# Patient Record
Sex: Male | Born: 1970 | Race: Black or African American | Hispanic: No | Marital: Single | Smoking: Never smoker
Health system: Southern US, Community
[De-identification: ages and names within clinical notes are randomized; demographics above are authoritative.]

## PROBLEM LIST (undated history)

## (undated) DIAGNOSIS — G8220 Paraplegia, unspecified: Secondary | ICD-10-CM

## (undated) DIAGNOSIS — E079 Disorder of thyroid, unspecified: Secondary | ICD-10-CM

## (undated) HISTORY — DX: Disorder of thyroid, unspecified: E07.9

## (undated) HISTORY — PX: SKIN GRAFT: SHX250

---

## 1998-11-13 ENCOUNTER — Encounter: Admission: RE | Admit: 1998-11-13 | Discharge: 1999-02-11 | Payer: Self-pay | Admitting: *Deleted

## 2000-05-22 ENCOUNTER — Encounter: Admission: RE | Admit: 2000-05-22 | Discharge: 2000-05-25 | Payer: Self-pay | Admitting: Internal Medicine

## 2008-06-13 ENCOUNTER — Inpatient Hospital Stay (HOSPITAL_COMMUNITY): Admission: EM | Admit: 2008-06-13 | Discharge: 2008-06-18 | Payer: Self-pay | Admitting: Emergency Medicine

## 2008-07-14 ENCOUNTER — Inpatient Hospital Stay (HOSPITAL_COMMUNITY): Admission: EM | Admit: 2008-07-14 | Discharge: 2008-07-18 | Payer: Self-pay | Admitting: Emergency Medicine

## 2008-07-16 ENCOUNTER — Ambulatory Visit: Payer: Self-pay | Admitting: Infectious Diseases

## 2008-07-18 ENCOUNTER — Encounter (HOSPITAL_BASED_OUTPATIENT_CLINIC_OR_DEPARTMENT_OTHER): Admission: RE | Admit: 2008-07-18 | Discharge: 2008-08-04 | Payer: Self-pay | Admitting: Internal Medicine

## 2008-07-29 ENCOUNTER — Emergency Department (HOSPITAL_COMMUNITY): Admission: EM | Admit: 2008-07-29 | Discharge: 2008-07-30 | Payer: Self-pay | Admitting: Emergency Medicine

## 2008-12-12 ENCOUNTER — Encounter (HOSPITAL_COMMUNITY): Admission: RE | Admit: 2008-12-12 | Discharge: 2009-02-05 | Payer: Self-pay | Admitting: Internal Medicine

## 2009-04-19 ENCOUNTER — Encounter (HOSPITAL_COMMUNITY): Admission: RE | Admit: 2009-04-19 | Discharge: 2009-07-18 | Payer: Self-pay | Admitting: Endocrinology

## 2009-04-23 ENCOUNTER — Ambulatory Visit (HOSPITAL_COMMUNITY): Admission: RE | Admit: 2009-04-23 | Discharge: 2009-04-23 | Payer: Self-pay | Admitting: Endocrinology

## 2009-05-11 ENCOUNTER — Inpatient Hospital Stay (HOSPITAL_COMMUNITY): Admission: EM | Admit: 2009-05-11 | Discharge: 2009-05-21 | Payer: Self-pay | Admitting: Emergency Medicine

## 2009-05-15 ENCOUNTER — Encounter (INDEPENDENT_AMBULATORY_CARE_PROVIDER_SITE_OTHER): Payer: Self-pay | Admitting: Internal Medicine

## 2009-05-17 ENCOUNTER — Ambulatory Visit: Payer: Self-pay | Admitting: Infectious Disease

## 2009-07-25 ENCOUNTER — Ambulatory Visit (HOSPITAL_COMMUNITY): Admission: RE | Admit: 2009-07-25 | Discharge: 2009-07-25 | Payer: Self-pay | Admitting: Internal Medicine

## 2009-10-24 IMAGING — CR DG CHEST 1V PORT
1 series · 1 of 1 positions shown · non-contrast
Comparison: None

CLINICAL DATA: Chest pain.

PORTABLE CHEST - 1 VIEW

[view not recorded]
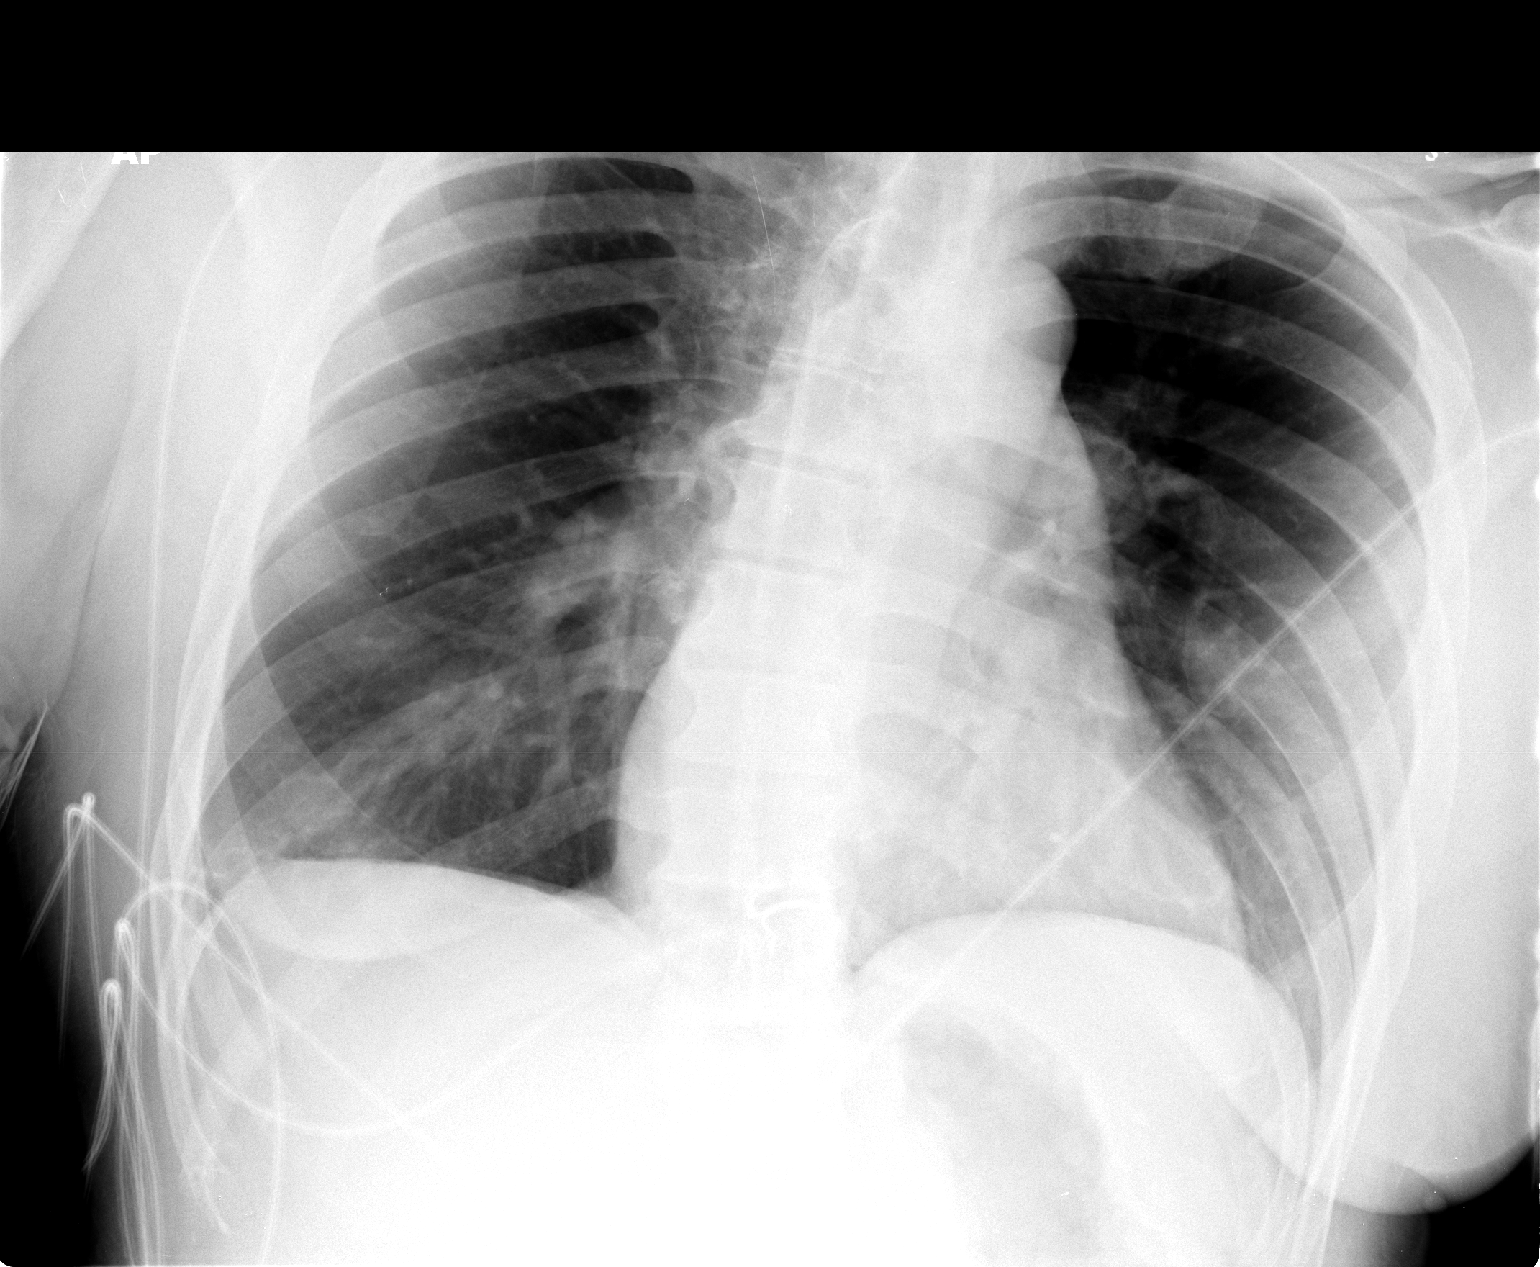

[1 of 1 positions shown; findings below may reference images not displayed]

FINDINGS: The cardiac silhouette, mediastinal and hilar contours
are within normal limits.  The lungs are clear except for streaky
basilar atelectasis.  The bony thorax is intact.
IMPRESSION: No acute cardiopulmonary findings.  Minimal streaky bibasilar
atelectasis.

## 2009-10-25 IMAGING — CR DG HIP W/ PELVIS BILAT
5 series · 5 of 5 positions shown · non-contrast
Comparison: 06/13/2008 CT pelvis

CLINICAL DATA: Ulcer, question osteomyelitis

BILATERAL HIP WITH PELVIS - 4+ VIEW

[t pelvis a.p.]
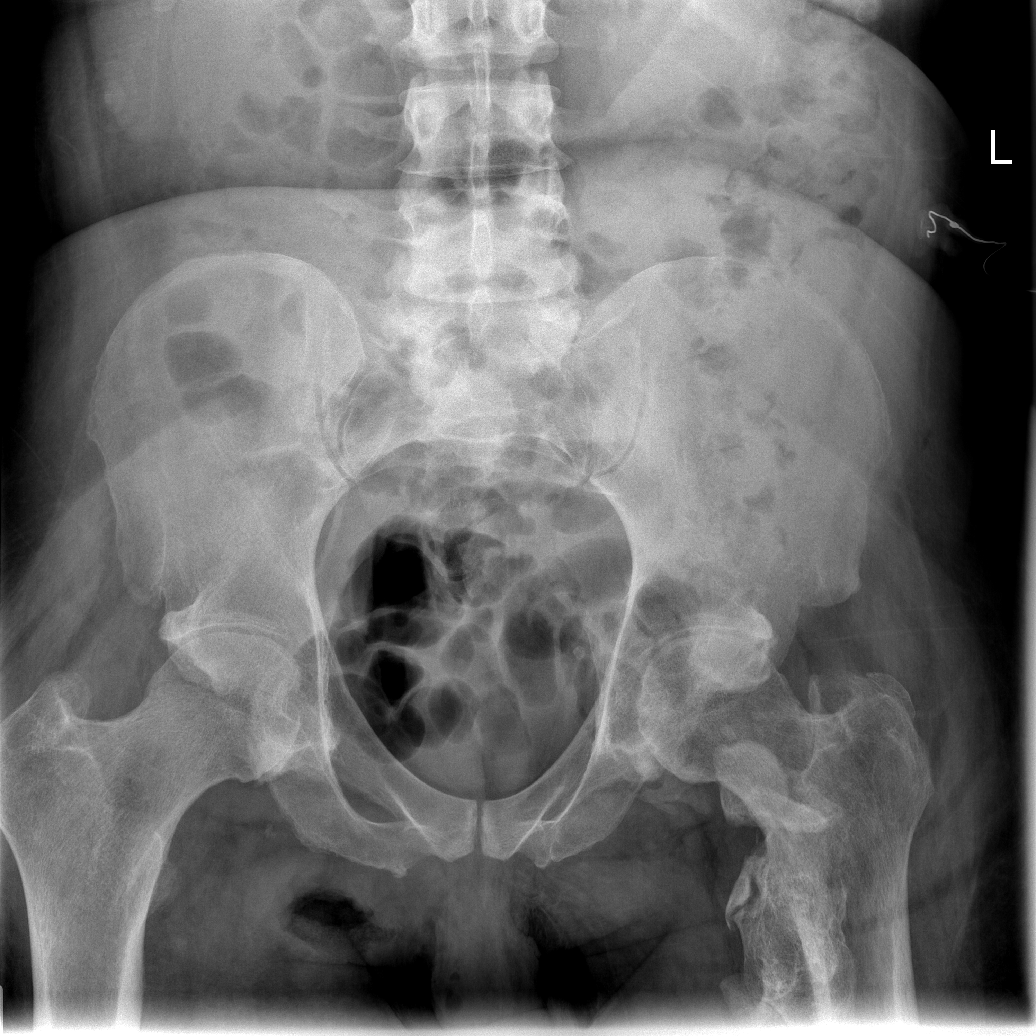

[t hip ap left]
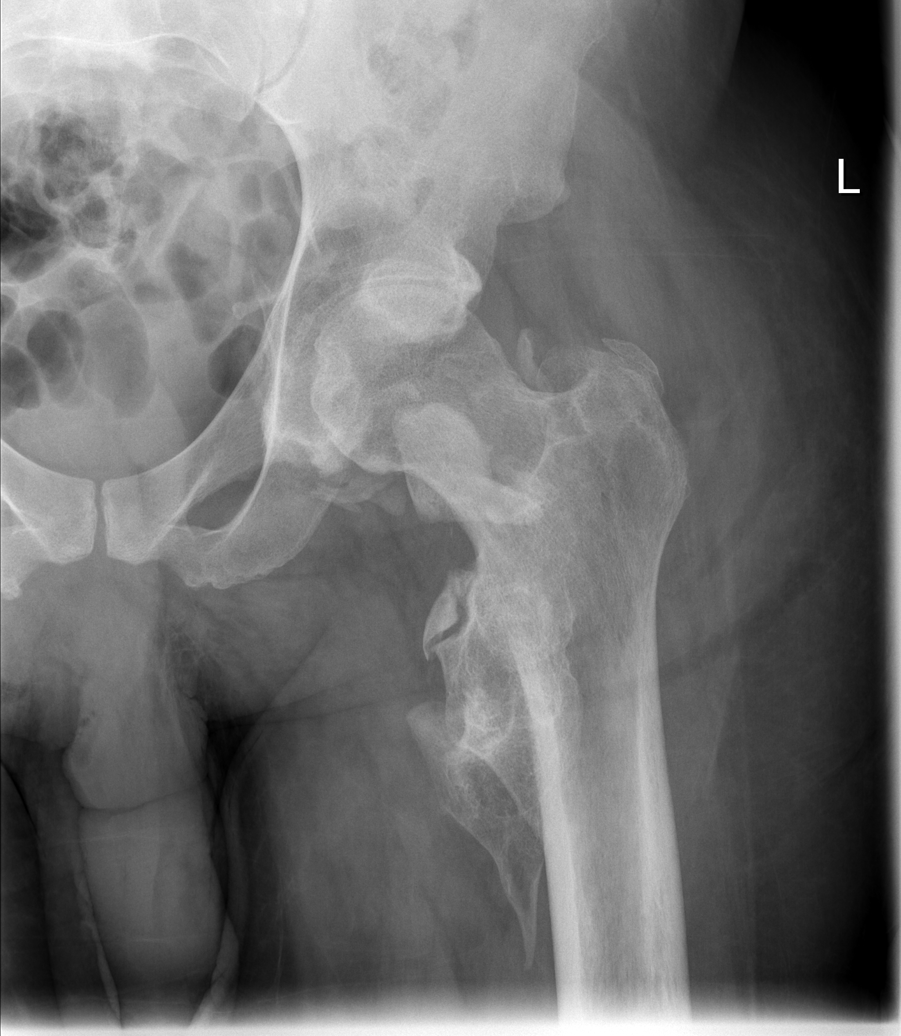

[t hip ap right]
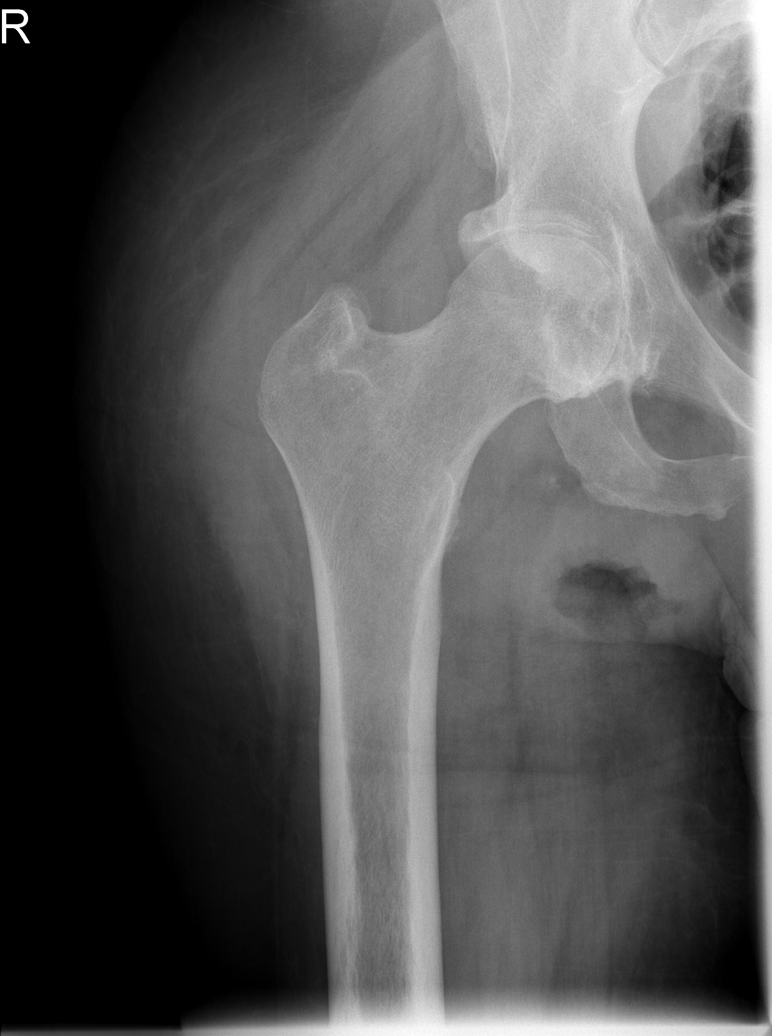

[t hip frog leg right]
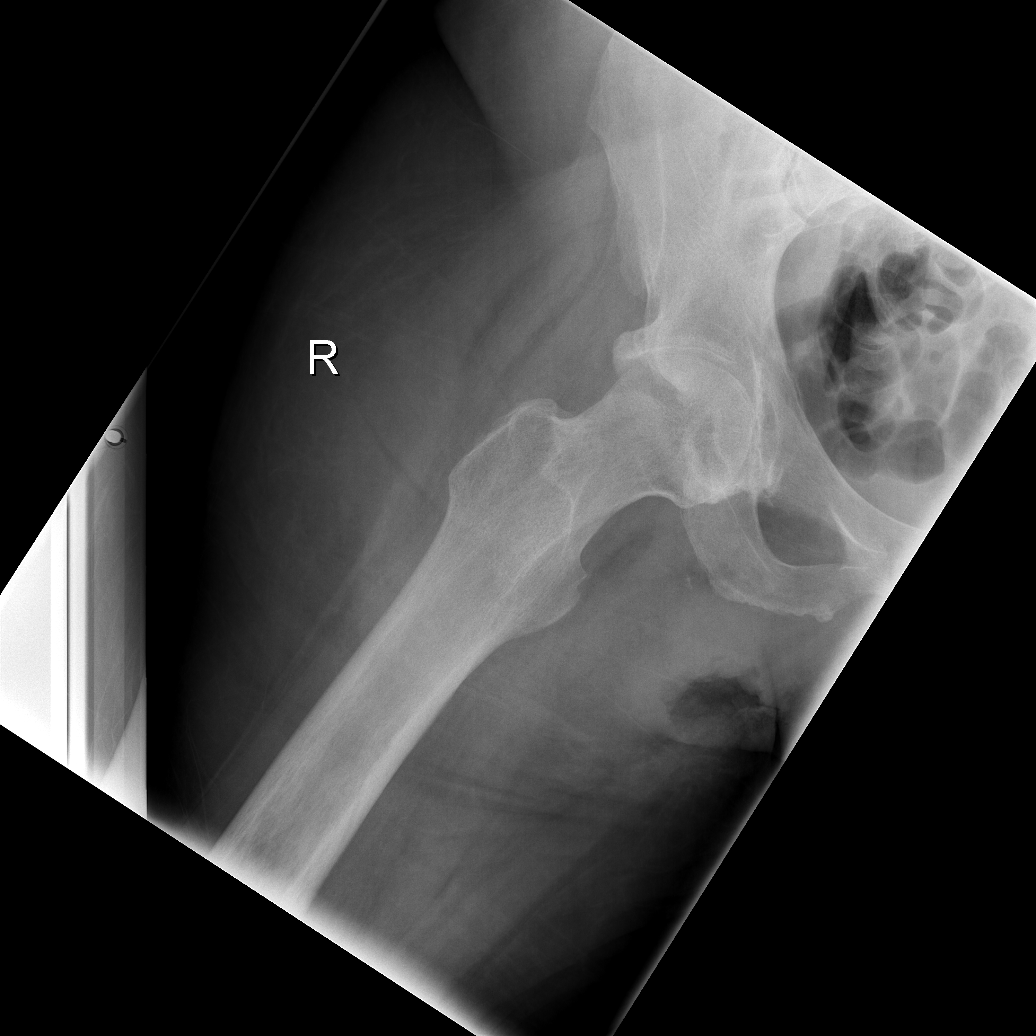

[t hip frog leg left]
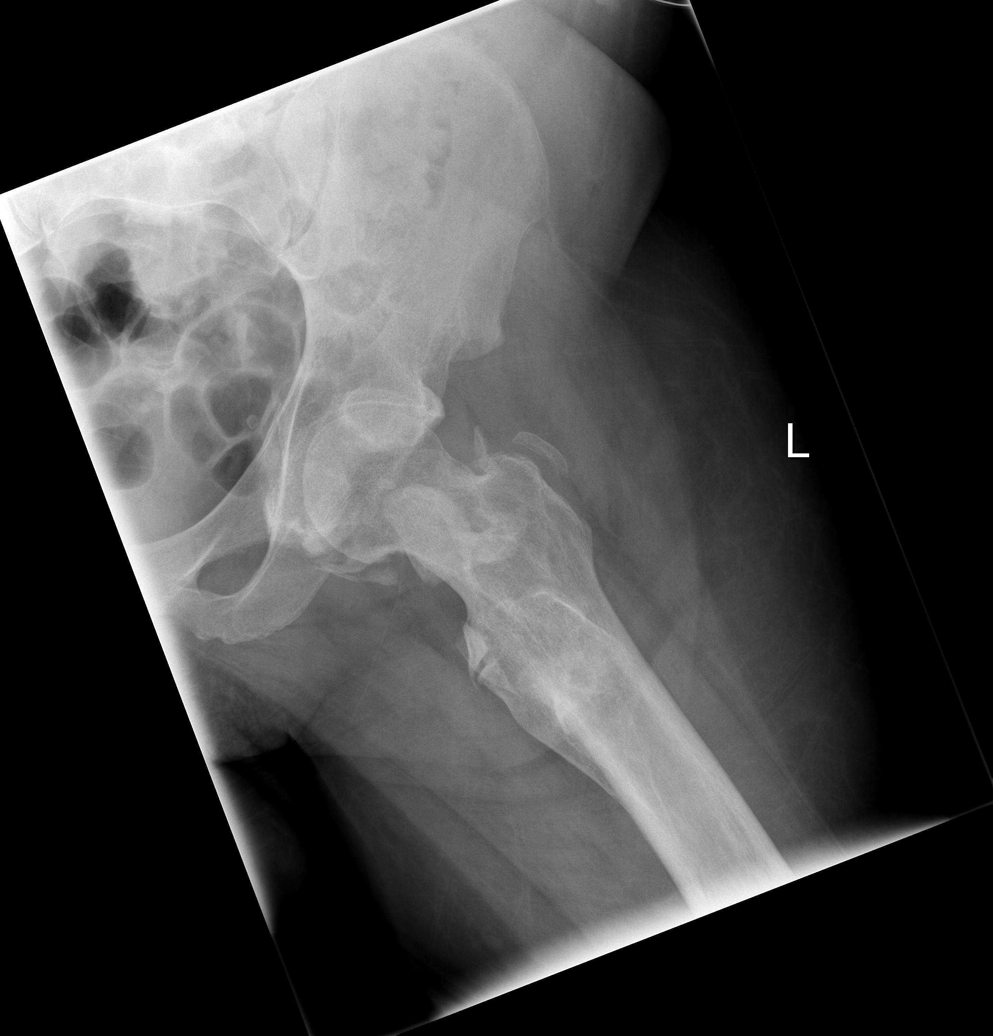

[5 of 5 positions shown; findings below may reference images not displayed]

FINDINGS: Heterotopic bone at left hip and proximal femur.
Mild bilateral hip joint space narrowing.
Bony pelvis intact.
Focus of soft tissue gas in right perineum, corresponding to
decubitus ulcer and soft tissue infection on prior CT.
No definite bone destruction identified to suggest osteomyelitis.
Left pelvic phlebolith.
Visualized bowel gas pattern normal.
IMPRESSION: Soft tissue gas in right perineum compatible with known
ulcer/abscess.
No definite bone destruction to suggest osteomyelitis by plain
film.
Bony demineralization with bilateral hip joint space narrowing.

## 2009-11-04 ENCOUNTER — Emergency Department (HOSPITAL_COMMUNITY): Admission: EM | Admit: 2009-11-04 | Discharge: 2009-11-04 | Payer: Self-pay | Admitting: Emergency Medicine

## 2010-05-16 LAB — URINALYSIS, ROUTINE W REFLEX MICROSCOPIC
Nitrite: NEGATIVE
Specific Gravity, Urine: 1.016 (ref 1.005–1.030)
pH: 7 (ref 5.0–8.0)

## 2010-05-27 LAB — C-REACTIVE PROTEIN: CRP: 7 mg/dL — ABNORMAL HIGH (ref <0.6)

## 2010-05-27 LAB — CBC
HCT: 19.5 % — ABNORMAL LOW (ref 39.0–52.0)
HCT: 25.5 % — ABNORMAL LOW (ref 39.0–52.0)
HCT: 27.1 % — ABNORMAL LOW (ref 39.0–52.0)
HCT: 27.6 % — ABNORMAL LOW (ref 39.0–52.0)
Hemoglobin: 6 g/dL — CL (ref 13.0–17.0)
Hemoglobin: 6.7 g/dL — CL (ref 13.0–17.0)
Hemoglobin: 7.1 g/dL — ABNORMAL LOW (ref 13.0–17.0)
Hemoglobin: 7.2 g/dL — ABNORMAL LOW (ref 13.0–17.0)
Hemoglobin: 7.9 g/dL — ABNORMAL LOW (ref 13.0–17.0)
Hemoglobin: 8.5 g/dL — ABNORMAL LOW (ref 13.0–17.0)
MCHC: 29.8 g/dL — ABNORMAL LOW (ref 30.0–36.0)
MCHC: 30.9 g/dL (ref 30.0–36.0)
MCHC: 31.2 g/dL (ref 30.0–36.0)
MCV: 73.4 fL — ABNORMAL LOW (ref 78.0–100.0)
MCV: 76.4 fL — ABNORMAL LOW (ref 78.0–100.0)
MCV: 77.6 fL — ABNORMAL LOW (ref 78.0–100.0)
MCV: 79 fL (ref 78.0–100.0)
Platelets: 466 10*3/uL — ABNORMAL HIGH (ref 150–400)
Platelets: 529 10*3/uL — ABNORMAL HIGH (ref 150–400)
Platelets: 542 10*3/uL — ABNORMAL HIGH (ref 150–400)
Platelets: 743 10*3/uL — ABNORMAL HIGH (ref 150–400)
RBC: 2.94 MIL/uL — ABNORMAL LOW (ref 4.22–5.81)
RBC: 3.08 MIL/uL — ABNORMAL LOW (ref 4.22–5.81)
RBC: 3.5 MIL/uL — ABNORMAL LOW (ref 4.22–5.81)
RDW: 22.4 % — ABNORMAL HIGH (ref 11.5–15.5)
RDW: 23 % — ABNORMAL HIGH (ref 11.5–15.5)
RDW: 23.5 % — ABNORMAL HIGH (ref 11.5–15.5)
RDW: 23.8 % — ABNORMAL HIGH (ref 11.5–15.5)
WBC: 13 10*3/uL — ABNORMAL HIGH (ref 4.0–10.5)
WBC: 17.8 10*3/uL — ABNORMAL HIGH (ref 4.0–10.5)
WBC: 37.3 10*3/uL — ABNORMAL HIGH (ref 4.0–10.5)
WBC: 7.5 10*3/uL (ref 4.0–10.5)
WBC: 9.2 10*3/uL (ref 4.0–10.5)
WBC: 9.6 10*3/uL (ref 4.0–10.5)

## 2010-05-27 LAB — URINE CULTURE

## 2010-05-27 LAB — CROSSMATCH
ABO/RH(D): O POS
Antibody Screen: NEGATIVE

## 2010-05-27 LAB — RENAL FUNCTION PANEL
Albumin: 1.3 g/dL — ABNORMAL LOW (ref 3.5–5.2)
BUN: 11 mg/dL (ref 6–23)
Chloride: 113 mEq/L — ABNORMAL HIGH (ref 96–112)
GFR calc non Af Amer: >60 mL/min (ref >60)
Phosphorus: 2.7 mg/dL (ref 2.3–4.6)
Potassium: 3.5 mEq/L (ref 3.5–5.1)
Sodium: 141 mEq/L (ref 135–145)

## 2010-05-27 LAB — COMPREHENSIVE METABOLIC PANEL
ALT: 16 U/L (ref 0–53)
AST: 17 U/L (ref 0–37)
Albumin: 1.7 g/dL — ABNORMAL LOW (ref 3.5–5.2)
Alkaline Phosphatase: 176 U/L — ABNORMAL HIGH (ref 39–117)
BUN: 19 mg/dL (ref 6–23)
Calcium: 7.4 mg/dL — ABNORMAL LOW (ref 8.4–10.5)
Chloride: 107 mEq/L (ref 96–112)
GFR calc Af Amer: 26 mL/min — ABNORMAL LOW (ref >60)
Total Bilirubin: 0.5 mg/dL (ref 0.3–1.2)
Total Protein: 7.4 g/dL (ref 6.0–8.3)

## 2010-05-27 LAB — DIFFERENTIAL
Basophils Absolute: 0 10*3/uL (ref 0.0–0.1)
Basophils Relative: 0 % (ref 0–1)
Basophils Relative: 0 % (ref 0–1)
Eosinophils Absolute: 0.2 10*3/uL (ref 0.0–0.7)
Eosinophils Relative: 0 % (ref 0–5)
Eosinophils Relative: 0 % (ref 0–5)
Eosinophils Relative: 1 % (ref 0–5)
Eosinophils Relative: 3 % (ref 0–5)
Lymphocytes Relative: 10 % — ABNORMAL LOW (ref 12–46)
Lymphs Abs: 0.7 10*3/uL (ref 0.7–4.0)
Lymphs Abs: 1.2 10*3/uL (ref 0.7–4.0)
Lymphs Abs: 1.4 10*3/uL (ref 0.7–4.0)
Lymphs Abs: 1.6 10*3/uL (ref 0.7–4.0)
Monocytes Absolute: 0.9 10*3/uL (ref 0.1–1.0)
Monocytes Absolute: 1.5 10*3/uL — ABNORMAL HIGH (ref 0.1–1.0)
Monocytes Relative: 4 % (ref 3–12)
Monocytes Relative: 7 % (ref 3–12)
Monocytes Relative: 9 % (ref 3–12)
Neutro Abs: 10.8 10*3/uL — ABNORMAL HIGH (ref 1.7–7.7)
Neutro Abs: 35.1 10*3/uL — ABNORMAL HIGH (ref 1.7–7.7)
Neutrophils Relative %: 75 % (ref 43–77)
Neutrophils Relative %: 84 % — ABNORMAL HIGH (ref 43–77)

## 2010-05-27 LAB — CULTURE, ROUTINE-ABSCESS
Culture: NO GROWTH
Gram Stain: NONE SEEN

## 2010-05-27 LAB — ANAEROBIC CULTURE: Gram Stain: NONE SEEN

## 2010-05-27 LAB — BASIC METABOLIC PANEL
BUN: 3 mg/dL — ABNORMAL LOW (ref 6–23)
CO2: 26 mEq/L (ref 19–32)
CO2: 29 mEq/L (ref 19–32)
Calcium: 7.3 mg/dL — ABNORMAL LOW (ref 8.4–10.5)
Calcium: 8 mg/dL — ABNORMAL LOW (ref 8.4–10.5)
Chloride: 105 mEq/L (ref 96–112)
Creatinine, Ser: 0.53 mg/dL (ref 0.4–1.5)
Creatinine, Ser: 0.66 mg/dL (ref 0.4–1.5)
Creatinine, Ser: 0.91 mg/dL (ref 0.4–1.5)
GFR calc Af Amer: >60 mL/min (ref >60)
GFR calc non Af Amer: >60 mL/min (ref >60)
Glucose, Bld: 101 mg/dL — ABNORMAL HIGH (ref 70–99)
Glucose, Bld: 88 mg/dL (ref 70–99)
Potassium: 3.4 mEq/L — ABNORMAL LOW (ref 3.5–5.1)
Sodium: 137 mEq/L (ref 135–145)

## 2010-05-27 LAB — URINALYSIS, ROUTINE W REFLEX MICROSCOPIC
Glucose, UA: NEGATIVE mg/dL
Ketones, ur: NEGATIVE mg/dL
Nitrite: NEGATIVE
Protein, ur: 30 mg/dL — AB
Specific Gravity, Urine: 1.011 (ref 1.005–1.030)
pH: 5.5 (ref 5.0–8.0)

## 2010-05-27 LAB — DIC (DISSEMINATED INTRAVASCULAR COAGULATION)PANEL
D-Dimer, Quant: 2.87 ug/mL-FEU — ABNORMAL HIGH (ref 0.00–0.48)
Fibrinogen: 779 mg/dL — ABNORMAL HIGH (ref 204–475)
Prothrombin Time: 15.1 seconds (ref 11.6–15.2)
aPTT: 50 seconds — ABNORMAL HIGH (ref 24–37)

## 2010-05-27 LAB — IRON AND TIBC
Iron: <10 ug/dL — ABNORMAL LOW (ref 42–135)
UIBC: 118 ug/dL

## 2010-05-27 LAB — CULTURE, BLOOD (ROUTINE X 2): Culture: NO GROWTH

## 2010-05-27 LAB — MRSA PCR SCREENING: MRSA by PCR: POSITIVE — AB

## 2010-05-27 LAB — HEMOGLOBIN AND HEMATOCRIT, BLOOD
HCT: 22.7 % — ABNORMAL LOW (ref 39.0–52.0)
Hemoglobin: 6.9 g/dL — CL (ref 13.0–17.0)

## 2010-05-27 LAB — HIV ANTIBODY (ROUTINE TESTING W REFLEX): HIV: NONREACTIVE

## 2010-05-27 LAB — AFB CULTURE WITH SMEAR (NOT AT ARMC): Acid Fast Smear: NONE SEEN

## 2010-05-27 LAB — VITAMIN B12: Vitamin B-12: 628 pg/mL (ref 211–911)

## 2010-05-27 LAB — SEDIMENTATION RATE
Sed Rate: 138 mm/hr — ABNORMAL HIGH (ref 0–16)
Sed Rate: >140 mm/hr — ABNORMAL HIGH (ref 0–16)

## 2010-05-27 LAB — URINE MICROSCOPIC-ADD ON

## 2010-05-27 LAB — FUNGUS CULTURE W SMEAR

## 2010-05-27 LAB — RETICULOCYTES
Retic Count, Absolute: 56.6 10*3/uL (ref 19.0–186.0)
Retic Ct Pct: 2 % (ref 0.4–3.1)

## 2010-05-27 LAB — PREPARE RBC (CROSSMATCH)

## 2010-05-31 ENCOUNTER — Emergency Department (HOSPITAL_COMMUNITY)
Admission: EM | Admit: 2010-05-31 | Discharge: 2010-05-31 | Disposition: A | Discharge status: Home / Self Care | Payer: Medicare Other | Attending: Emergency Medicine | Admitting: Emergency Medicine

## 2010-05-31 DIAGNOSIS — Z711 Person with feared health complaint in whom no diagnosis is made: Secondary | ICD-10-CM | POA: Insufficient documentation

## 2010-05-31 LAB — BASIC METABOLIC PANEL
CO2: 27 mEq/L (ref 19–32)
Calcium: 9.1 mg/dL (ref 8.4–10.5)
Chloride: 105 mEq/L (ref 96–112)
Glucose, Bld: 110 mg/dL — ABNORMAL HIGH (ref 70–99)
Sodium: 136 mEq/L (ref 135–145)

## 2010-06-06 LAB — CROSSMATCH

## 2010-06-11 LAB — DIFFERENTIAL
Basophils Relative: 0 % (ref 0–1)
Eosinophils Absolute: 0 10*3/uL (ref 0.0–0.7)
Eosinophils Absolute: 0 10*3/uL (ref 0.0–0.7)
Eosinophils Relative: 0 % (ref 0–5)
Lymphocytes Relative: 15 % (ref 12–46)
Lymphs Abs: 1.9 10*3/uL (ref 0.7–4.0)
Lymphs Abs: 1.8 10*3/uL (ref 0.7–4.0)
Monocytes Absolute: 1.7 10*3/uL — ABNORMAL HIGH (ref 0.1–1.0)
Monocytes Absolute: 1.3 10*3/uL — ABNORMAL HIGH (ref 0.1–1.0)
Monocytes Relative: 11 % (ref 3–12)
Neutrophils Relative %: 85 % — ABNORMAL HIGH (ref 43–77)

## 2010-06-11 LAB — HEMOGLOBIN AND HEMATOCRIT, BLOOD
HCT: 27.5 % — ABNORMAL LOW (ref 39.0–52.0)
Hemoglobin: 9 g/dL — ABNORMAL LOW (ref 13.0–17.0)

## 2010-06-11 LAB — URINALYSIS, ROUTINE W REFLEX MICROSCOPIC
Bilirubin Urine: NEGATIVE
Ketones, ur: NEGATIVE mg/dL
Nitrite: NEGATIVE
Protein, ur: NEGATIVE mg/dL
pH: 5.5 (ref 5.0–8.0)

## 2010-06-11 LAB — POCT I-STAT, CHEM 8
Glucose, Bld: 126 mg/dL — ABNORMAL HIGH (ref 70–99)
HCT: 28 % — ABNORMAL LOW (ref 39.0–52.0)
Hemoglobin: 9.5 g/dL — ABNORMAL LOW (ref 13.0–17.0)
Potassium: 3.3 mEq/L — ABNORMAL LOW (ref 3.5–5.1)
Sodium: 140 mEq/L (ref 135–145)
TCO2: 29 mmol/L (ref 0–100)

## 2010-06-11 LAB — URINE CULTURE

## 2010-06-11 LAB — CBC
HCT: 27.4 % — ABNORMAL LOW (ref 39.0–52.0)
HCT: 23.6 % — ABNORMAL LOW (ref 39.0–52.0)
Hemoglobin: 9.1 g/dL — ABNORMAL LOW (ref 13.0–17.0)
MCHC: 32.9 g/dL (ref 30.0–36.0)
MCHC: 33.1 g/dL (ref 30.0–36.0)
MCHC: 33.2 g/dL (ref 30.0–36.0)
MCHC: 32.6 g/dL (ref 30.0–36.0)
MCHC: 33.2 g/dL (ref 30.0–36.0)
MCV: 77.6 fL — ABNORMAL LOW (ref 78.0–100.0)
MCV: 79.5 fL (ref 78.0–100.0)
MCV: 80.2 fL (ref 78.0–100.0)
MCV: 76.3 fL — ABNORMAL LOW (ref 78.0–100.0)
MCV: 76.7 fL — ABNORMAL LOW (ref 78.0–100.0)
Platelets: 367 10*3/uL (ref 150–400)
Platelets: 336 10*3/uL (ref 150–400)
Platelets: 378 10*3/uL (ref 150–400)
RBC: 3.45 MIL/uL — ABNORMAL LOW (ref 4.22–5.81)
RBC: 3.23 MIL/uL — ABNORMAL LOW (ref 4.22–5.81)
RDW: 17.9 % — ABNORMAL HIGH (ref 11.5–15.5)
RDW: 21.1 % — ABNORMAL HIGH (ref 11.5–15.5)
RDW: 16.1 % — ABNORMAL HIGH (ref 11.5–15.5)
WBC: 10.4 10*3/uL (ref 4.0–10.5)
WBC: 10.7 10*3/uL — ABNORMAL HIGH (ref 4.0–10.5)

## 2010-06-11 LAB — COMPREHENSIVE METABOLIC PANEL
ALT: 21 U/L (ref 0–53)
ALT: 30 U/L (ref 0–53)
AST: 32 U/L (ref 0–37)
Albumin: 2 g/dL — ABNORMAL LOW (ref 3.5–5.2)
Albumin: 2.4 g/dL — ABNORMAL LOW (ref 3.5–5.2)
Alkaline Phosphatase: 72 U/L (ref 39–117)
CO2: 29 mEq/L (ref 19–32)
Calcium: 8.7 mg/dL (ref 8.4–10.5)
Chloride: 102 mEq/L (ref 96–112)
Creatinine, Ser: 0.66 mg/dL (ref 0.4–1.5)
GFR calc Af Amer: >60 mL/min (ref >60)
GFR calc non Af Amer: >60 mL/min (ref >60)
Glucose, Bld: 102 mg/dL — ABNORMAL HIGH (ref 70–99)
Potassium: 3.4 mEq/L — ABNORMAL LOW (ref 3.5–5.1)
Sodium: 136 mEq/L (ref 135–145)
Sodium: 139 mEq/L (ref 135–145)
Total Bilirubin: 0.9 mg/dL (ref 0.3–1.2)
Total Protein: 6.1 g/dL (ref 6.0–8.3)
Total Protein: 7.2 g/dL (ref 6.0–8.3)

## 2010-06-11 LAB — CLOSTRIDIUM DIFFICILE EIA

## 2010-06-11 LAB — CULTURE, BLOOD (ROUTINE X 2): Culture: NO GROWTH

## 2010-06-11 LAB — BASIC METABOLIC PANEL
BUN: 6 mg/dL (ref 6–23)
BUN: 3 mg/dL — ABNORMAL LOW (ref 6–23)
CO2: 28 mEq/L (ref 19–32)
CO2: 29 mEq/L (ref 19–32)
CO2: 28 mEq/L (ref 19–32)
Chloride: 106 mEq/L (ref 96–112)
Chloride: 110 mEq/L (ref 96–112)
Chloride: 104 mEq/L (ref 96–112)
Creatinine, Ser: 0.48 mg/dL (ref 0.4–1.5)
GFR calc Af Amer: >60 mL/min (ref >60)
Glucose, Bld: 101 mg/dL — ABNORMAL HIGH (ref 70–99)
Glucose, Bld: 143 mg/dL — ABNORMAL HIGH (ref 70–99)
Potassium: 3.4 mEq/L — ABNORMAL LOW (ref 3.5–5.1)
Potassium: 3.1 mEq/L — ABNORMAL LOW (ref 3.5–5.1)
Sodium: 144 mEq/L (ref 135–145)

## 2010-06-11 LAB — URINALYSIS, MICROSCOPIC ONLY
Bilirubin Urine: NEGATIVE
Hgb urine dipstick: NEGATIVE
Ketones, ur: NEGATIVE mg/dL
Specific Gravity, Urine: 1.019 (ref 1.005–1.030)
pH: 6.5 (ref 5.0–8.0)

## 2010-06-11 LAB — CROSSMATCH

## 2010-06-11 LAB — WOUND CULTURE

## 2010-06-11 LAB — VITAMIN B12: Vitamin B-12: 488 pg/mL (ref 211–911)

## 2010-06-11 LAB — FERRITIN: Ferritin: 1166 ng/mL — ABNORMAL HIGH (ref 22–322)

## 2010-06-11 LAB — C-REACTIVE PROTEIN: CRP: 5.7 mg/dL — ABNORMAL HIGH (ref <0.6)

## 2010-06-11 LAB — RETICULOCYTES: Retic Ct Pct: 1.3 % (ref 0.4–3.1)

## 2010-06-11 LAB — ABO/RH: ABO/RH(D): O POS

## 2010-06-11 LAB — IRON AND TIBC: Iron: 23 ug/dL — ABNORMAL LOW (ref 42–135)

## 2010-06-12 LAB — DIFFERENTIAL
Eosinophils Relative: 1 % (ref 0–5)
Lymphocytes Relative: 10 % — ABNORMAL LOW (ref 12–46)
Lymphs Abs: 1.2 10*3/uL (ref 0.7–4.0)
Monocytes Absolute: 1.3 10*3/uL — ABNORMAL HIGH (ref 0.1–1.0)
Monocytes Relative: 11 % (ref 3–12)
Neutro Abs: 9.6 10*3/uL — ABNORMAL HIGH (ref 1.7–7.7)

## 2010-06-12 LAB — URINE MICROSCOPIC-ADD ON

## 2010-06-12 LAB — BASIC METABOLIC PANEL
BUN: 7 mg/dL (ref 6–23)
BUN: 7 mg/dL (ref 6–23)
Calcium: 9.2 mg/dL (ref 8.4–10.5)
Calcium: 9.4 mg/dL (ref 8.4–10.5)
Chloride: 102 mEq/L (ref 96–112)
Creatinine, Ser: 0.47 mg/dL (ref 0.4–1.5)
GFR calc Af Amer: >60 mL/min (ref >60)
GFR calc non Af Amer: >60 mL/min (ref >60)
GFR calc non Af Amer: >60 mL/min (ref >60)
GFR calc non Af Amer: >60 mL/min (ref >60)
Glucose, Bld: 110 mg/dL — ABNORMAL HIGH (ref 70–99)
Glucose, Bld: 121 mg/dL — ABNORMAL HIGH (ref 70–99)
Glucose, Bld: 98 mg/dL (ref 70–99)
Potassium: 3.3 mEq/L — ABNORMAL LOW (ref 3.5–5.1)
Sodium: 141 mEq/L (ref 135–145)
Sodium: 144 mEq/L (ref 135–145)
Sodium: 146 mEq/L — ABNORMAL HIGH (ref 135–145)

## 2010-06-12 LAB — HEPATIC FUNCTION PANEL
Albumin: 2.3 g/dL — ABNORMAL LOW (ref 3.5–5.2)
Indirect Bilirubin: 0.7 mg/dL (ref 0.3–0.9)
Total Protein: 6.6 g/dL (ref 6.0–8.3)

## 2010-06-12 LAB — CBC
HCT: 28.4 % — ABNORMAL LOW (ref 39.0–52.0)
Hemoglobin: 8.8 g/dL — ABNORMAL LOW (ref 13.0–17.0)
Hemoglobin: 9.4 g/dL — ABNORMAL LOW (ref 13.0–17.0)
MCV: 79.2 fL (ref 78.0–100.0)
Platelets: 366 10*3/uL (ref 150–400)
Platelets: 376 10*3/uL (ref 150–400)
RBC: 3.65 MIL/uL — ABNORMAL LOW (ref 4.22–5.81)
RDW: 13.5 % (ref 11.5–15.5)
RDW: 13.7 % (ref 11.5–15.5)
RDW: 13.8 % (ref 11.5–15.5)
WBC: 9.5 10*3/uL (ref 4.0–10.5)

## 2010-06-12 LAB — IRON AND TIBC
Saturation Ratios: 15 % — ABNORMAL LOW (ref 20–55)
UIBC: 113 ug/dL

## 2010-06-12 LAB — DRUGS OF ABUSE SCREEN W/O ALC, ROUTINE URINE
Cocaine Metabolites: NEGATIVE
Phencyclidine (PCP): NEGATIVE
Propoxyphene: NEGATIVE

## 2010-06-12 LAB — WOUND CULTURE

## 2010-06-12 LAB — CULTURE, BLOOD (ROUTINE X 2): Culture: NO GROWTH

## 2010-06-12 LAB — VANCOMYCIN, TROUGH
Vancomycin Tr: 18.8 ug/mL (ref 10.0–20.0)
Vancomycin Tr: 9 ug/mL — ABNORMAL LOW (ref 10.0–20.0)

## 2010-06-12 LAB — ALBUMIN: Albumin: 2.1 g/dL — ABNORMAL LOW (ref 3.5–5.2)

## 2010-06-12 LAB — URINALYSIS, ROUTINE W REFLEX MICROSCOPIC
Glucose, UA: NEGATIVE mg/dL
Hgb urine dipstick: NEGATIVE
Ketones, ur: 15 mg/dL — AB
Nitrite: NEGATIVE
Specific Gravity, Urine: 1.025 (ref 1.005–1.030)
pH: 5.5 (ref 5.0–8.0)

## 2010-06-12 LAB — RETICULOCYTES
RBC.: 3.51 MIL/uL — ABNORMAL LOW (ref 4.22–5.81)
Retic Count, Absolute: 24.6 10*3/uL (ref 19.0–186.0)

## 2010-06-12 LAB — URINE CULTURE: Culture: NO GROWTH

## 2010-06-12 LAB — TSH: TSH: 0.021 u[IU]/mL — ABNORMAL LOW (ref 0.350–4.500)

## 2010-06-12 LAB — APTT: aPTT: 51 seconds — ABNORMAL HIGH (ref 24–37)

## 2010-06-12 LAB — PROTIME-INR
INR: 1.3 (ref 0.00–1.49)
Prothrombin Time: 16.6 seconds — ABNORMAL HIGH (ref 11.6–15.2)

## 2010-06-12 LAB — MAGNESIUM: Magnesium: 2.3 mg/dL (ref 1.5–2.5)

## 2010-06-12 LAB — FOLATE: Folate: 16.6 ng/mL

## 2010-07-16 NOTE — Discharge Summary (Signed)
[name], [name]                 ACCOUNT NO.:  [id number]   MEDICAL RECORD NO.:  [id number]          PATIENT TYPE:  INP   LOCATION:  5525                         FACILITY:  [facility]   PHYSICIAN:  Eduard Clos, MDDATE OF BIRTH:  04-05-1970   DATE OF ADMISSION:  07/13/2008  DATE OF DISCHARGE:  07/18/2008                               DISCHARGE SUMMARY   COURSE IN THE HOSPITAL:  A 40 year old male with known history of T5  paraplegia presented with complaints of fever and weakness.  On  admission, the patient was found to have a fever of around 104.  The  patient also was complaining of weakness, eventually was found to have a  hemoglobin of 8.8 which further decreased to 7.8.  The patient did  receive 2 units of packed red blood cells.  The patient was started on  empiric antibiotics and thought the fever could be from his right  ischial decubitus ulcer.  Wound consult was obtained.  Wound consult  recommended hydrotherapy during hospitalization and rule out  osteomyelitis.  X-rays were done for the ischial area, all of which were  negative for any bony involvement.  Infectious Disease consult was  obtained with Dr. Sampson Goon and Dr. Maurice March.  At this time, the patient's  fever has resolved.  His hemoglobin was stable.  I did discuss with Dr.  Sampson Goon about the wound culture which was growing Staph aureus and  Gram-negative rods.  Dr. Sampson Goon advised Cipro and Flagyl and  doxycycline for a course of 6 weeks.  The patient will need home health  RN to do dressing, which also I discussed with wound team extensively.  The patient will be discharged on Santyl ointment and Mepilex Border.  At the time of dictation, the patient was hemodynamically stable.   PROCEDURES DONE DURING THIS STAY:  1. Wound consult.  2. Chest x-ray on Jul 13, 2008, showed no acute cardiopulmonary      findings.  3. X-ray of hip bilateral showed soft tissue gas in right perineum      compatible with known  ulcer or abscess, no definite bone      destruction  bony demineralization with bilateral hip joint space      narrowing.   PERTINENT LABORATORY DATA:  The patient's hemoglobin had dropped up to  7.8, now at the time of discharge after 2 units of packed red blood  cells the patient's hemoglobin is stable at 9.4 and hematocrit of 28.5.  The patient did have wound culture which was positive of Staph aureus  and gram-negative rods.  At this time, culture sensitivity is still  pending, but we will be discharging on doxycycline and Cipro to cover  empirically.  The patient's C. diff also was positive.  We will start  him on Flagyl for the same.   FINAL DIAGNOSES:  1. Fever secondary to infected right ischial decubitus ulcer.  2. Clostridium difficile colitis.  3. Anemia, with history of iron-deficiency anemia.  4. Paraplegia, T5 injury.  5. History of neurogenic bladder.   MEDICATIONS AT DISCHARGE:  1. Ciprofloxacin 500 mg  p.o. b.i.d. for 6 weeks.  2. Doxycycline 100 mg p.o. b.i.d. for 6 weeks.  3. Flagyl 500 mg p.o. t.i.d. x7 weeks.  4. Ferrous sulfate 325 mg p.o. t.i.d.  5. Over-the-counter lactobacillus tablets p.o. daily for 6 weeks.  6. Santyl ointment daily to the right ischial area, apply daily and      cover with 4 x 4 gauze to be done Home Health RN.  7. Mepilex Border every 5th day to be taken care by [facility] RN.  8. Multivitamin 1 tablet p.o. daily.   PLAN:  The patient is to follow up with Dr. Alinda Sierras, the patient's  primary care physician, in 1 week's time.  Recheck BMET and CBC at that  time, to call and make an appointment with Dr. Noelle Penner, plastic surgeon  at [phone].  The patient will be discharged home with Home Health  RN for wound dressing.  The patient is to follow up with Wound Clinic as  scheduled.  The patient is to be on a regular diet.      Eduard Clos, MD  Electronically Signed     ANK/MEDQ  D:  07/18/2008  T:  07/19/2008  Job:   [id number]   cc:   Alinda Sierras, PA  Loreta Ave, MD
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
## 2010-07-16 NOTE — Discharge Summary (Signed)
[name], [name]                 ACCOUNT NO.:  [id number]   MEDICAL RECORD NO.:  [id number]          PATIENT TYPE:  INP   LOCATION:  3713                         FACILITY:  [facility]   PHYSICIAN:  Monte Fantasia, MD  DATE OF BIRTH:  1970/04/14   DATE OF ADMISSION:  06/13/2008  DATE OF DISCHARGE:                               DISCHARGE SUMMARY   PRIMARY CARE PHYSICIAN:  Alinda Sierras, PA in [facility].   CONSULTS:  Loreta Ave, MD from Plastic Surgery and Lennie Muckle, MD Surgery.   PROCEDURES DONE DURING THE STAY IN THE HOSPITAL:  Debridement of the  right perineal wound.   DISCHARGE DIAGNOSES:  1. Infected right perineal wound ulcer, stage III.  2. Thirst, which is resolved.  3. Anemia of chronic disease.  4. D5 paraplegia with neurogenic bladder.  5. Mild hypokalemia, which is resolved.  6. Methicillin-susceptible Staphylococcus aureus cellulitis for the      sacral decubitus.  7. Anemia of chronic disease.   MEDICATIONS UPON DISCHARGE:  1. Augmentin 875/125 one capsule p.o. q.12 h. for a total of 23 days      to complete a course of 4 weeks.  2. Ferrous sulfate 325 mg p.o. three times a day for a total of 1      month.  3. Ensure 237 mL p.o. three times a day.   COURSE DURING THE HOSPITAL STAY:  A 40 year old African American  paraplegic man patient with history of D5 paraplegia was admitted on  June 13, 2008, with complaints of low back pain.  On examination, the  patient had noticed soft tissue area and the right perineum, noticed a  hole in the skin and that brought him to the hospital.  On examination,  the patient had discharge through the wound ulcer and was found to be  febrile with a temperature of 102.1 and heart rate in 100s to 160s.  The  patient was admitted in view of infected right peroneal sacral decubitus  ulcer with the thirst response.  The patient was started on IV  antibiotics with vancomycin and Zosyn in view of the same and  given  wound care through the stay in the hospital.  Also, he underwent CT of  the pelvis with contrast, which showed 13 x 10 x 8 cm soft tissue  infection involving the right perineum with no evidence of abscess or  osteomyelitis.  The patient on admission had leukocytosis of 12.2, which  through the stay in the hospital decreased and now is within normal  limits of 7.7.  The patient improved well with his temperatures and did  not have any episode of fever during stay in the hospital.  On  evaluation by the wound care and on examination of the ulcer, the  patient had slough and necrotic debris at the base of the ulcer and  hence surgical evaluation was called in for wound debridement for the  same.  Also the patient was evaluated by plastic surgeon, Dr.  Loreta Ave in view of flap surgery and agreed for the same as  the patient would need it within next 1-2 months as the patient would  need to improve his nutrition prior to the surgery for better healing.  The patient's wound cultures sent on admission grew MSSA with no  evidence of osteomyelitis.  As per discussion with ID, the patient can  be discharged on p.o. antibiotics for a total of 4 weeks on Augmentin.  The patient at present is medically stable to be discharged.  We will  arrange for the wound care at home and needs to follow up with his  primary care physician in next 1 week.  The patient has been counseled  regarding the same.   RADIOLOGICAL INVESTIGATIONS DONE DURING THE STAY IN THE HOSPITAL:  CT of  the pelvis done on June 13, 2008.  Impression, 13.8 x 10.3 x 8.2 cm  area of soft tissue infection involving the right perineum, scrotum  extending slightly more superiorly containing soft tissue gas, could be  due to infection with gas-forming organism or direct extension of air  into the region through the patient's decubitus ulcer at that location.  No abscess or evidence of osteomyelitis.   LABS DONE DURING THE  STAY IN THE HOSPITAL:  Total WBC 7.7, hemoglobin  8.8, hematocrit 26.4, platelets of 386.  PT 16.6, INR 1.3, PTT 51.  Sodium 146, potassium 3.7, chloride 108, bicarb 29, glucose 110, BUN 3,  creatinine 0.47, albumin 2.1, calcium of 9.4, TSH 0.021.  Serum iron 20,  TIBC 133, percent saturation 15, vitamin B12 of 508, folate 16.6,  ferritin 1246, prealbumin 4.4.  UA is negative.  Wound cultures and  staph aureus sensitive to clindamycin, erythromycin, gentamicin,  levofloxacin, oxacillin, penicillin, rifampin, Bactrim, vancomycin,  tetracycline, and moxifloxacin.  Blood cultures have been no growth to  date.  Urine cultures have been no growth.  Wound care dressings advised  to have wound care dressing with Collagenase and topical application to  the wound with wet-to-dry dressings.   DISPOSITION:  The patient at present is medically stable to be  discharging and be discharged home.  We will arrange for home wound  care.  Also the patient is recommended to follow up with his primary  care physician in next 1 week.   Total time for discharge is 40 minutes.      Monte Fantasia, MD  Electronically Signed     MP/MEDQ  D:  06/18/2008  T:  06/18/2008  Job:  [id number]   cc:   Alinda Sierras, PA
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
## 2010-07-16 NOTE — Op Note (Signed)
[name], [name]                 ACCOUNT NO.:  [id number]   MEDICAL RECORD NO.:  [id number]          PATIENT TYPE:  INP   LOCATION:  3713                         FACILITY:  [facility]   PHYSICIAN:  Lennie Muckle, MD      DATE OF BIRTH:  12/12/1970   DATE OF PROCEDURE:  06/17/2008  DATE OF DISCHARGE:                               OPERATIVE REPORT   PREOPERATIVE DIAGNOSIS:  Sacral decubitus with necrotic tissue.   POSTOPERATIVE DIAGNOSIS:  Sacral decubitus with necrotic tissue.   PROCEDURE:  Debridement of necrotic tissue.   SURGEON:  Lennie Muckle, MD   ASSISTANTS:  None.   ESTIMATED BLOOD LOSS:  Minimal amount of bleeding.   INDICATION FOR PROCEDURE:  [name] is a pleasant 40 year old male who  came in with an infected wound.  He has been receiving hydrotherapy, but  was noted to have some necrotic tissue at the base.  I talked to him  about performing debridement of the area to promote healing.  Informed  consent was obtained prior to procedure.   DETAILS OF PROCEDURE:  [name] was seen and identified in the  preoperative holding area.  He was on Zosyn empirically, therefore, I  did not re-dose him.  He was placed under general endotracheal  anesthesia, placed in the prone position.  His perineum was prepped and  draped in usual sterile fashion.  A time-out procedure indicating the  patient and procedure were performed.  Using the wound bed, I debrided  the base of the ulcer, I did not extend the incision since the skin was  healthy over the top.  I did not go all the way down to the bone; but to  healthy bed of tissue, there was some minimal amount of bleeding.  I  packed the wound with moist gauze and the patient tolerated the  procedure well.  Dry gauze was placed upon dressing.  The patient was  then placed in supine position, extubated, and transferred to post  anesthesia care unit in stable condition.  He will be continuing with  his moist and dry and then hopefully  will be discharged for a flap at a  lateral date by Dr. Noelle Penner.      Lennie Muckle, MD  Electronically Signed     ALA/MEDQ  D:  06/17/2008  T:  06/18/2008  Job:  [id number]   cc:   Loreta Ave, MD
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
## 2011-03-17 DIAGNOSIS — N319 Neuromuscular dysfunction of bladder, unspecified: Secondary | ICD-10-CM | POA: Diagnosis not present

## 2011-05-05 DIAGNOSIS — N35919 Unspecified urethral stricture, male, unspecified site: Secondary | ICD-10-CM | POA: Insufficient documentation

## 2011-06-03 DIAGNOSIS — IMO0002 Reserved for concepts with insufficient information to code with codable children: Secondary | ICD-10-CM | POA: Diagnosis not present

## 2011-06-03 DIAGNOSIS — I10 Essential (primary) hypertension: Secondary | ICD-10-CM | POA: Diagnosis not present

## 2011-06-03 DIAGNOSIS — R339 Retention of urine, unspecified: Secondary | ICD-10-CM | POA: Diagnosis not present

## 2011-06-03 DIAGNOSIS — N319 Neuromuscular dysfunction of bladder, unspecified: Secondary | ICD-10-CM | POA: Diagnosis not present

## 2011-06-25 DIAGNOSIS — E0590 Thyrotoxicosis, unspecified without thyrotoxic crisis or storm: Secondary | ICD-10-CM | POA: Diagnosis not present

## 2011-07-11 DIAGNOSIS — N35919 Unspecified urethral stricture, male, unspecified site: Secondary | ICD-10-CM | POA: Diagnosis not present

## 2011-08-18 DIAGNOSIS — N529 Male erectile dysfunction, unspecified: Secondary | ICD-10-CM | POA: Diagnosis not present

## 2011-09-11 ENCOUNTER — Encounter (HOSPITAL_COMMUNITY): Payer: Self-pay | Admitting: Nurse Practitioner

## 2011-09-11 ENCOUNTER — Emergency Department (HOSPITAL_COMMUNITY)
Admission: EM | Admit: 2011-09-11 | Discharge: 2011-09-11 | Disposition: A | Discharge status: Home / Self Care | Payer: Medicare Other | Attending: Emergency Medicine | Admitting: Emergency Medicine

## 2011-09-11 DIAGNOSIS — K089 Disorder of teeth and supporting structures, unspecified: Secondary | ICD-10-CM | POA: Insufficient documentation

## 2011-09-11 DIAGNOSIS — K047 Periapical abscess without sinus: Secondary | ICD-10-CM | POA: Insufficient documentation

## 2011-09-11 DIAGNOSIS — K0889 Other specified disorders of teeth and supporting structures: Secondary | ICD-10-CM

## 2011-09-11 DIAGNOSIS — K1370 Unspecified lesions of oral mucosa: Secondary | ICD-10-CM | POA: Diagnosis not present

## 2011-09-11 HISTORY — DX: Rider (driver) (passenger) of other motorcycle injured in unspecified traffic accident, initial encounter: V29.99XA

## 2011-09-11 MED ORDER — HYDROCODONE-ACETAMINOPHEN 5-325 MG PO TABS: 1.0000 | ORAL_TABLET | Freq: Four times a day (QID) | ORAL | Status: DC | PRN

## 2011-09-11 MED ORDER — HYDROCODONE-ACETAMINOPHEN 5-325 MG PO TABS: 1.0000 | ORAL_TABLET | Freq: Four times a day (QID) | ORAL | Status: AC | PRN

## 2011-09-11 MED ORDER — PENICILLIN V POTASSIUM 500 MG PO TABS: 500.0000 mg | ORAL_TABLET | Freq: Four times a day (QID) | ORAL | Status: AC

## 2011-09-11 MED ORDER — PENICILLIN V POTASSIUM 500 MG PO TABS: 500.0000 mg | ORAL_TABLET | Freq: Four times a day (QID) | ORAL | Status: DC

## 2011-09-11 NOTE — ED Notes (Signed)
Left lower jaw swelling c 2 days worse yesterday states thinks a filling came out

## 2011-09-11 NOTE — ED Provider Notes (Signed)
Medical screening examination/treatment/procedure(s) were conducted as a shared visit with resident physician practitioner(s) and myself.  I personally evaluated the patient during the encounter. Healthy male comes with few days of dental pain that has persisted with new swelling in the buccal mucosa of the left cheek. No fluctuance with palpation and no erythema, or pustules noted. Gingiva exam is unremarkable as well. No deep space infection or ludwig's angina. No trismus. D/C with antibiotics and dentist follow up. Pt has insurance.  Derwood Kaplan, MD 09/11/11 1947

## 2011-09-11 NOTE — ED Notes (Signed)
C/o L lower toothache since yesterday then woke today with L facial swelling. No trouble breathing or swallowing
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
## 2011-10-31 DIAGNOSIS — N35919 Unspecified urethral stricture, male, unspecified site: Secondary | ICD-10-CM | POA: Diagnosis not present

## 2011-10-31 DIAGNOSIS — N319 Neuromuscular dysfunction of bladder, unspecified: Secondary | ICD-10-CM | POA: Diagnosis not present

## 2011-11-04 DIAGNOSIS — N35919 Unspecified urethral stricture, male, unspecified site: Secondary | ICD-10-CM | POA: Diagnosis not present

## 2011-11-04 DIAGNOSIS — N529 Male erectile dysfunction, unspecified: Secondary | ICD-10-CM | POA: Diagnosis not present

## 2011-11-04 DIAGNOSIS — N319 Neuromuscular dysfunction of bladder, unspecified: Secondary | ICD-10-CM | POA: Insufficient documentation

## 2011-11-07 DIAGNOSIS — T83091A Other mechanical complication of indwelling urethral catheter, initial encounter: Secondary | ICD-10-CM | POA: Diagnosis not present

## 2011-11-07 DIAGNOSIS — N35919 Unspecified urethral stricture, male, unspecified site: Secondary | ICD-10-CM | POA: Diagnosis not present

## 2011-11-07 DIAGNOSIS — N529 Male erectile dysfunction, unspecified: Secondary | ICD-10-CM | POA: Diagnosis not present

## 2011-11-07 DIAGNOSIS — N319 Neuromuscular dysfunction of bladder, unspecified: Secondary | ICD-10-CM | POA: Diagnosis not present

## 2011-11-27 DIAGNOSIS — N319 Neuromuscular dysfunction of bladder, unspecified: Secondary | ICD-10-CM | POA: Diagnosis not present

## 2011-11-27 DIAGNOSIS — N35919 Unspecified urethral stricture, male, unspecified site: Secondary | ICD-10-CM | POA: Diagnosis not present

## 2011-11-27 DIAGNOSIS — N529 Male erectile dysfunction, unspecified: Secondary | ICD-10-CM | POA: Diagnosis not present

## 2011-12-08 ENCOUNTER — Emergency Department (HOSPITAL_COMMUNITY)
Admission: EM | Admit: 2011-12-08 | Discharge: 2011-12-09 | Disposition: A | Discharge status: Home / Self Care | Payer: Medicare Other | Attending: Emergency Medicine | Admitting: Emergency Medicine

## 2011-12-08 ENCOUNTER — Encounter (HOSPITAL_COMMUNITY): Payer: Self-pay

## 2011-12-08 DIAGNOSIS — R509 Fever, unspecified: Secondary | ICD-10-CM | POA: Insufficient documentation

## 2011-12-08 DIAGNOSIS — R1110 Vomiting, unspecified: Secondary | ICD-10-CM | POA: Insufficient documentation

## 2011-12-08 DIAGNOSIS — N390 Urinary tract infection, site not specified: Secondary | ICD-10-CM | POA: Diagnosis not present

## 2011-12-08 DIAGNOSIS — Z993 Dependence on wheelchair: Secondary | ICD-10-CM | POA: Insufficient documentation

## 2011-12-08 DIAGNOSIS — IMO0001 Reserved for inherently not codable concepts without codable children: Secondary | ICD-10-CM | POA: Insufficient documentation

## 2011-12-08 DIAGNOSIS — R51 Headache: Secondary | ICD-10-CM | POA: Insufficient documentation

## 2011-12-08 LAB — URINALYSIS, ROUTINE W REFLEX MICROSCOPIC
Glucose, UA: NEGATIVE mg/dL
Nitrite: POSITIVE — AB
Protein, ur: 30 mg/dL — AB
Urobilinogen, UA: 0.2 mg/dL (ref 0.0–1.0)

## 2011-12-08 LAB — BASIC METABOLIC PANEL
Calcium: 9.1 mg/dL (ref 8.4–10.5)
Creatinine, Ser: 1.3 mg/dL (ref 0.50–1.35)
GFR calc non Af Amer: 66 mL/min — ABNORMAL LOW (ref >90)
Sodium: 137 mEq/L (ref 135–145)

## 2011-12-08 LAB — CBC WITH DIFFERENTIAL/PLATELET
Basophils Absolute: 0 10*3/uL (ref 0.0–0.1)
Basophils Relative: 0 % (ref 0–1)
Eosinophils Absolute: 0 10*3/uL (ref 0.0–0.7)
Eosinophils Relative: 0 % (ref 0–5)
MCH: 27.6 pg (ref 26.0–34.0)
MCHC: 33.4 g/dL (ref 30.0–36.0)
MCV: 82.7 fL (ref 78.0–100.0)
Monocytes Absolute: 0.7 10*3/uL (ref 0.1–1.0)
Platelets: 142 10*3/uL — ABNORMAL LOW (ref 150–400)
RDW: 13.4 % (ref 11.5–15.5)
WBC: 9.1 10*3/uL (ref 4.0–10.5)

## 2011-12-08 LAB — URINE MICROSCOPIC-ADD ON

## 2011-12-08 MED ORDER — DEXTROSE 5 % IV SOLN
1.0000 g | Freq: Once | INTRAVENOUS | Status: AC
  Administered 2011-12-08: 1 g via INTRAVENOUS
  Filled 2011-12-08: qty 10

## 2011-12-08 MED ORDER — ACETAMINOPHEN 325 MG PO TABS
650.0000 mg | ORAL_TABLET | Freq: Once | ORAL | Status: AC
  Administered 2011-12-08: 650 mg via ORAL
  Filled 2011-12-08: qty 2

## 2011-12-08 MED ORDER — SODIUM CHLORIDE 0.9 % IV BOLUS (SEPSIS)
1000.0000 mL | Freq: Once | INTRAVENOUS | Status: AC
  Administered 2011-12-09: 1000 mL via INTRAVENOUS

## 2011-12-08 MED ORDER — CEFUROXIME AXETIL 500 MG PO TABS: 500.0000 mg | ORAL_TABLET | Freq: Two times a day (BID) | ORAL | Status: DC

## 2011-12-08 MED ORDER — SODIUM CHLORIDE 0.9 % IV BOLUS (SEPSIS): 1000.0000 mL | Freq: Once | INTRAVENOUS | Status: DC

## 2011-12-08 MED ORDER — IBUPROFEN 800 MG PO TABS
800.0000 mg | ORAL_TABLET | Freq: Once | ORAL | Status: AC
  Administered 2011-12-08: 800 mg via ORAL
  Filled 2011-12-08: qty 1

## 2011-12-08 MED ORDER — ACETAMINOPHEN 325 MG PO TABS
975.0000 mg | ORAL_TABLET | Freq: Once | ORAL | Status: AC
  Administered 2011-12-09: 975 mg via ORAL
  Filled 2011-12-08: qty 3

## 2011-12-08 NOTE — ED Notes (Signed)
Patient reports that he had a fever yesterday and vomited today. patient states that he feels better since he vomited.
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
## 2011-12-08 NOTE — ED Provider Notes (Signed)
Patient complains of headache gradual in onset this morning at the top of his head with fever. He admits to vomiting one time this morning presently headache is minimal and patient feels well. He denies having had any neck pain or neck stiffness On exam patient is alert nontoxic Glasgow Coma Score 15 HEENT exam mucous there is moist pink nonicteric neck supple with no signs of meningitis no cervical lymphadenopathy ears normal oropharynx normal bilateral tympanic membranes normal lungs clear auscultation heart regular rate and rhythm abdomen nondistended nontender bilateral lower extremities with muscular atrophy. No skin breakdown genitalia normal male, wearing a condom catheter  Doug Sou, MD 12/08/11 1943

## 2011-12-08 NOTE — ED Provider Notes (Signed)
Re-evaluation of patient done and he is found to be persistently tachycardic and with a difficult to control fever, despite dosing of antipyretics. He is also tachypneic without cough, SOB or chest pain. He denies headache, abdominal pain, N, V. He has a UTI which is not uncommon, per patient, who is paraplegic and wears a condom cathether. He reports he feels improved. PO fluids provided.   Re-eval: Lactic acid negative. Patient reports he is feeling better. Fever improving, IV fluids given. Will discharge home.  Rodena Medin, PA-C 12/09/11 [phone]

## 2011-12-08 NOTE — ED Notes (Signed)
Pt called to notify nurse that he was having chills. On assessment pt was visibly shaking. Temp taken. NP notified. See new orders

## 2011-12-09 NOTE — ED Provider Notes (Signed)
Medical screening examination/treatment/procedure(s) were conducted as a shared visit with non-physician practitioner(s) and myself.  I personally evaluated the patient during the encounter  Doug Sou, MD 12/09/11 0028

## 2011-12-10 LAB — URINE CULTURE: Special Requests: NORMAL

## 2011-12-10 NOTE — ED Provider Notes (Signed)
Medical screening examination/treatment/procedure(s) were conducted as a shared visit with non-physician practitioner(s) and myself.  I personally evaluated the patient during the encounter  Doug Sou, MD 12/10/11 [phone]

## 2012-05-13 DIAGNOSIS — E079 Disorder of thyroid, unspecified: Secondary | ICD-10-CM | POA: Diagnosis not present

## 2012-05-13 DIAGNOSIS — Z125 Encounter for screening for malignant neoplasm of prostate: Secondary | ICD-10-CM | POA: Diagnosis not present

## 2012-05-13 DIAGNOSIS — G8220 Paraplegia, unspecified: Secondary | ICD-10-CM | POA: Diagnosis not present

## 2012-05-13 DIAGNOSIS — E781 Pure hyperglyceridemia: Secondary | ICD-10-CM | POA: Diagnosis not present

## 2012-06-21 DIAGNOSIS — E039 Hypothyroidism, unspecified: Secondary | ICD-10-CM | POA: Diagnosis not present

## 2012-06-21 DIAGNOSIS — N390 Urinary tract infection, site not specified: Secondary | ICD-10-CM | POA: Diagnosis not present

## 2012-06-21 DIAGNOSIS — E7800 Pure hypercholesterolemia, unspecified: Secondary | ICD-10-CM | POA: Diagnosis not present

## 2012-07-05 DIAGNOSIS — N390 Urinary tract infection, site not specified: Secondary | ICD-10-CM | POA: Diagnosis not present

## 2012-07-27 DIAGNOSIS — N390 Urinary tract infection, site not specified: Secondary | ICD-10-CM | POA: Diagnosis not present

## 2012-07-27 DIAGNOSIS — N529 Male erectile dysfunction, unspecified: Secondary | ICD-10-CM | POA: Diagnosis not present

## 2012-07-27 DIAGNOSIS — N35919 Unspecified urethral stricture, male, unspecified site: Secondary | ICD-10-CM | POA: Diagnosis not present

## 2012-07-27 DIAGNOSIS — N319 Neuromuscular dysfunction of bladder, unspecified: Secondary | ICD-10-CM | POA: Diagnosis not present

## 2012-07-27 DIAGNOSIS — E7800 Pure hypercholesterolemia, unspecified: Secondary | ICD-10-CM | POA: Diagnosis not present

## 2012-08-06 DIAGNOSIS — G8220 Paraplegia, unspecified: Secondary | ICD-10-CM | POA: Insufficient documentation

## 2012-08-06 DIAGNOSIS — E039 Hypothyroidism, unspecified: Secondary | ICD-10-CM | POA: Insufficient documentation

## 2012-08-06 DIAGNOSIS — Z01818 Encounter for other preprocedural examination: Secondary | ICD-10-CM | POA: Diagnosis not present

## 2012-08-06 DIAGNOSIS — Z01810 Encounter for preprocedural cardiovascular examination: Secondary | ICD-10-CM | POA: Diagnosis not present

## 2012-08-09 DIAGNOSIS — N35919 Unspecified urethral stricture, male, unspecified site: Secondary | ICD-10-CM | POA: Diagnosis not present

## 2012-08-09 DIAGNOSIS — E039 Hypothyroidism, unspecified: Secondary | ICD-10-CM | POA: Diagnosis not present

## 2012-08-09 DIAGNOSIS — N529 Male erectile dysfunction, unspecified: Secondary | ICD-10-CM | POA: Diagnosis not present

## 2012-08-26 DIAGNOSIS — G8220 Paraplegia, unspecified: Secondary | ICD-10-CM | POA: Diagnosis not present

## 2012-08-26 DIAGNOSIS — E7800 Pure hypercholesterolemia, unspecified: Secondary | ICD-10-CM | POA: Diagnosis not present

## 2012-08-26 DIAGNOSIS — E039 Hypothyroidism, unspecified: Secondary | ICD-10-CM | POA: Diagnosis not present

## 2012-09-13 DIAGNOSIS — G8220 Paraplegia, unspecified: Secondary | ICD-10-CM | POA: Diagnosis not present

## 2012-09-13 DIAGNOSIS — Z993 Dependence on wheelchair: Secondary | ICD-10-CM | POA: Diagnosis not present

## 2012-09-13 DIAGNOSIS — Z5189 Encounter for other specified aftercare: Secondary | ICD-10-CM | POA: Diagnosis not present

## 2012-09-13 DIAGNOSIS — Z87828 Personal history of other (healed) physical injury and trauma: Secondary | ICD-10-CM | POA: Diagnosis not present

## 2012-09-13 DIAGNOSIS — R262 Difficulty in walking, not elsewhere classified: Secondary | ICD-10-CM | POA: Diagnosis not present

## 2012-10-28 DIAGNOSIS — N390 Urinary tract infection, site not specified: Secondary | ICD-10-CM | POA: Diagnosis not present

## 2012-11-11 DIAGNOSIS — N35919 Unspecified urethral stricture, male, unspecified site: Secondary | ICD-10-CM | POA: Diagnosis not present

## 2012-11-11 DIAGNOSIS — N529 Male erectile dysfunction, unspecified: Secondary | ICD-10-CM | POA: Diagnosis not present

## 2012-11-11 DIAGNOSIS — N319 Neuromuscular dysfunction of bladder, unspecified: Secondary | ICD-10-CM | POA: Diagnosis not present

## 2012-11-22 DIAGNOSIS — N390 Urinary tract infection, site not specified: Secondary | ICD-10-CM | POA: Diagnosis not present

## 2012-12-08 ENCOUNTER — Ambulatory Visit: Payer: Medicare Other | Attending: Family Medicine | Admitting: Physical Therapy

## 2012-12-08 ENCOUNTER — Ambulatory Visit: Payer: Medicare Other | Admitting: Physical Therapy

## 2012-12-08 DIAGNOSIS — R293 Abnormal posture: Secondary | ICD-10-CM | POA: Insufficient documentation

## 2012-12-08 DIAGNOSIS — IMO0001 Reserved for inherently not codable concepts without codable children: Secondary | ICD-10-CM | POA: Insufficient documentation

## 2012-12-08 DIAGNOSIS — M6281 Muscle weakness (generalized): Secondary | ICD-10-CM | POA: Insufficient documentation

## 2012-12-23 ENCOUNTER — Ambulatory Visit: Payer: Medicare Other | Admitting: Physical Therapy

## 2012-12-23 DIAGNOSIS — IMO0001 Reserved for inherently not codable concepts without codable children: Secondary | ICD-10-CM | POA: Diagnosis not present

## 2012-12-23 DIAGNOSIS — R293 Abnormal posture: Secondary | ICD-10-CM | POA: Diagnosis not present

## 2012-12-23 DIAGNOSIS — M6281 Muscle weakness (generalized): Secondary | ICD-10-CM | POA: Diagnosis not present

## 2013-01-24 DIAGNOSIS — G8190 Hemiplegia, unspecified affecting unspecified side: Secondary | ICD-10-CM | POA: Diagnosis not present

## 2013-01-24 DIAGNOSIS — I10 Essential (primary) hypertension: Secondary | ICD-10-CM | POA: Diagnosis not present

## 2013-01-24 DIAGNOSIS — G8220 Paraplegia, unspecified: Secondary | ICD-10-CM | POA: Diagnosis not present

## 2013-03-29 DIAGNOSIS — G8220 Paraplegia, unspecified: Secondary | ICD-10-CM | POA: Diagnosis not present

## 2013-03-29 DIAGNOSIS — IMO0001 Reserved for inherently not codable concepts without codable children: Secondary | ICD-10-CM | POA: Diagnosis not present

## 2013-03-29 DIAGNOSIS — Z4689 Encounter for fitting and adjustment of other specified devices: Secondary | ICD-10-CM | POA: Diagnosis not present

## 2013-04-12 DIAGNOSIS — G8220 Paraplegia, unspecified: Secondary | ICD-10-CM | POA: Diagnosis not present

## 2013-04-12 DIAGNOSIS — IMO0001 Reserved for inherently not codable concepts without codable children: Secondary | ICD-10-CM | POA: Diagnosis not present

## 2013-05-24 DIAGNOSIS — I10 Essential (primary) hypertension: Secondary | ICD-10-CM | POA: Diagnosis not present

## 2013-05-24 DIAGNOSIS — Z125 Encounter for screening for malignant neoplasm of prostate: Secondary | ICD-10-CM | POA: Diagnosis not present

## 2013-05-24 DIAGNOSIS — G8190 Hemiplegia, unspecified affecting unspecified side: Secondary | ICD-10-CM | POA: Diagnosis not present

## 2013-06-22 DIAGNOSIS — R51 Headache: Secondary | ICD-10-CM | POA: Diagnosis not present

## 2013-07-15 DIAGNOSIS — I10 Essential (primary) hypertension: Secondary | ICD-10-CM | POA: Diagnosis not present

## 2013-09-19 DIAGNOSIS — G8220 Paraplegia, unspecified: Secondary | ICD-10-CM | POA: Diagnosis not present

## 2013-09-19 DIAGNOSIS — G8190 Hemiplegia, unspecified affecting unspecified side: Secondary | ICD-10-CM | POA: Diagnosis not present

## 2013-09-19 DIAGNOSIS — E039 Hypothyroidism, unspecified: Secondary | ICD-10-CM | POA: Diagnosis not present

## 2013-10-24 DIAGNOSIS — Z111 Encounter for screening for respiratory tuberculosis: Secondary | ICD-10-CM | POA: Diagnosis not present

## 2013-10-26 DIAGNOSIS — Z111 Encounter for screening for respiratory tuberculosis: Secondary | ICD-10-CM | POA: Diagnosis not present

## 2013-10-27 ENCOUNTER — Emergency Department (HOSPITAL_COMMUNITY)
Admission: EM | Admit: 2013-10-27 | Discharge: 2013-10-27 | Disposition: A | Discharge status: Home / Self Care | Payer: Medicare Other | Attending: Emergency Medicine | Admitting: Emergency Medicine

## 2013-10-27 ENCOUNTER — Encounter (HOSPITAL_COMMUNITY): Payer: Self-pay | Admitting: Emergency Medicine

## 2013-10-27 DIAGNOSIS — S46909A Unspecified injury of unspecified muscle, fascia and tendon at shoulder and upper arm level, unspecified arm, initial encounter: Secondary | ICD-10-CM | POA: Diagnosis not present

## 2013-10-27 DIAGNOSIS — Y9389 Activity, other specified: Secondary | ICD-10-CM | POA: Diagnosis not present

## 2013-10-27 DIAGNOSIS — T148XXA Other injury of unspecified body region, initial encounter: Secondary | ICD-10-CM

## 2013-10-27 DIAGNOSIS — IMO0002 Reserved for concepts with insufficient information to code with codable children: Secondary | ICD-10-CM | POA: Insufficient documentation

## 2013-10-27 DIAGNOSIS — Y92410 Unspecified street and highway as the place of occurrence of the external cause: Secondary | ICD-10-CM | POA: Diagnosis not present

## 2013-10-27 DIAGNOSIS — Z8669 Personal history of other diseases of the nervous system and sense organs: Secondary | ICD-10-CM | POA: Diagnosis not present

## 2013-10-27 DIAGNOSIS — M25549 Pain in joints of unspecified hand: Secondary | ICD-10-CM | POA: Diagnosis not present

## 2013-10-27 DIAGNOSIS — S4980XA Other specified injuries of shoulder and upper arm, unspecified arm, initial encounter: Secondary | ICD-10-CM | POA: Diagnosis present

## 2013-10-27 HISTORY — DX: Paraplegia, unspecified: G82.20

## 2013-10-27 NOTE — ED Provider Notes (Signed)
Medical screening examination/treatment/procedure(s) were performed by non-physician practitioner and as supervising physician I was immediately available for consultation/collaboration.   EKG Interpretation None        Phillp Dolores, MD 10/27/13 2348 
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
## 2013-11-08 NOTE — ED Provider Notes (Signed)
Medical screening examination/treatment/procedure(s) were performed by non-physician practitioner and as supervising physician I was immediately available for consultation/collaboration.   EKG Interpretation None        Elwin Mocha, MD 11/08/13 2326

## 2014-01-17 DIAGNOSIS — N35013 Post-traumatic anterior urethral stricture: Secondary | ICD-10-CM | POA: Diagnosis not present

## 2014-01-17 DIAGNOSIS — N528 Other male erectile dysfunction: Secondary | ICD-10-CM | POA: Diagnosis not present

## 2014-01-17 DIAGNOSIS — N319 Neuromuscular dysfunction of bladder, unspecified: Secondary | ICD-10-CM | POA: Diagnosis not present

## 2014-04-05 DIAGNOSIS — G8221 Paraplegia, complete: Secondary | ICD-10-CM | POA: Diagnosis not present

## 2014-04-05 DIAGNOSIS — E6609 Other obesity due to excess calories: Secondary | ICD-10-CM | POA: Diagnosis not present

## 2014-04-05 DIAGNOSIS — E039 Hypothyroidism, unspecified: Secondary | ICD-10-CM | POA: Diagnosis not present

## 2014-04-06 DIAGNOSIS — E0800 Diabetes mellitus due to underlying condition with hyperosmolarity without nonketotic hyperglycemic-hyperosmolar coma (NKHHC): Secondary | ICD-10-CM | POA: Diagnosis not present

## 2014-04-06 DIAGNOSIS — I10 Essential (primary) hypertension: Secondary | ICD-10-CM | POA: Diagnosis not present

## 2014-04-11 DIAGNOSIS — I10 Essential (primary) hypertension: Secondary | ICD-10-CM | POA: Diagnosis not present

## 2014-05-05 ENCOUNTER — Ambulatory Visit: Payer: Medicare Other | Admitting: Skilled Nursing Facility1

## 2014-05-12 ENCOUNTER — Encounter (HOSPITAL_COMMUNITY): Payer: Self-pay | Admitting: Family Medicine

## 2014-05-12 ENCOUNTER — Inpatient Hospital Stay (HOSPITAL_COMMUNITY)
Admission: EM | Admit: 2014-05-12 | Discharge: 2014-05-18 | DRG: 871 | Disposition: A | Discharge status: Home / Self Care | Payer: Medicare Other | Attending: Internal Medicine | Admitting: Internal Medicine

## 2014-05-12 ENCOUNTER — Emergency Department (HOSPITAL_COMMUNITY): Payer: Medicare Other

## 2014-05-12 DIAGNOSIS — R509 Fever, unspecified: Secondary | ICD-10-CM

## 2014-05-12 DIAGNOSIS — A419 Sepsis, unspecified organism: Secondary | ICD-10-CM | POA: Diagnosis not present

## 2014-05-12 DIAGNOSIS — J9811 Atelectasis: Secondary | ICD-10-CM | POA: Diagnosis not present

## 2014-05-12 DIAGNOSIS — A4151 Sepsis due to Escherichia coli [E. coli]: Secondary | ICD-10-CM | POA: Diagnosis not present

## 2014-05-12 DIAGNOSIS — L89220 Pressure ulcer of left hip, unstageable: Secondary | ICD-10-CM | POA: Diagnosis not present

## 2014-05-12 DIAGNOSIS — R739 Hyperglycemia, unspecified: Secondary | ICD-10-CM | POA: Diagnosis present

## 2014-05-12 DIAGNOSIS — G8220 Paraplegia, unspecified: Secondary | ICD-10-CM | POA: Diagnosis present

## 2014-05-12 DIAGNOSIS — R404 Transient alteration of awareness: Secondary | ICD-10-CM | POA: Diagnosis not present

## 2014-05-12 DIAGNOSIS — R531 Weakness: Secondary | ICD-10-CM | POA: Diagnosis not present

## 2014-05-12 DIAGNOSIS — J189 Pneumonia, unspecified organism: Secondary | ICD-10-CM | POA: Diagnosis not present

## 2014-05-12 DIAGNOSIS — E039 Hypothyroidism, unspecified: Secondary | ICD-10-CM | POA: Diagnosis not present

## 2014-05-12 DIAGNOSIS — N390 Urinary tract infection, site not specified: Secondary | ICD-10-CM | POA: Diagnosis present

## 2014-05-12 DIAGNOSIS — D649 Anemia, unspecified: Secondary | ICD-10-CM | POA: Diagnosis present

## 2014-05-12 DIAGNOSIS — B9620 Unspecified Escherichia coli [E. coli] as the cause of diseases classified elsewhere: Secondary | ICD-10-CM | POA: Diagnosis present

## 2014-05-12 DIAGNOSIS — L89329 Pressure ulcer of left buttock, unspecified stage: Secondary | ICD-10-CM | POA: Diagnosis present

## 2014-05-12 DIAGNOSIS — R918 Other nonspecific abnormal finding of lung field: Secondary | ICD-10-CM | POA: Diagnosis not present

## 2014-05-12 DIAGNOSIS — E6609 Other obesity due to excess calories: Secondary | ICD-10-CM | POA: Diagnosis not present

## 2014-05-12 DIAGNOSIS — J90 Pleural effusion, not elsewhere classified: Secondary | ICD-10-CM | POA: Diagnosis not present

## 2014-05-12 LAB — COMPREHENSIVE METABOLIC PANEL
ALBUMIN: 3.6 g/dL (ref 3.5–5.2)
ALT: 157 U/L — ABNORMAL HIGH (ref 0–53)
AST: 525 U/L — AB (ref 0–37)
Alkaline Phosphatase: 50 U/L (ref 39–117)
Anion gap: 12 (ref 5–15)
BUN: 9 mg/dL (ref 6–23)
CALCIUM: 8.4 mg/dL (ref 8.4–10.5)
CO2: 24 mmol/L (ref 19–32)
CREATININE: 1.4 mg/dL — AB (ref 0.50–1.35)
Chloride: 103 mmol/L (ref 96–112)
GFR calc Af Amer: 69 mL/min — ABNORMAL LOW (ref >90)
GFR calc non Af Amer: 59 mL/min — ABNORMAL LOW (ref >90)
Glucose, Bld: 144 mg/dL — ABNORMAL HIGH (ref 70–99)
Potassium: 3.6 mmol/L (ref 3.5–5.1)
Sodium: 139 mmol/L (ref 135–145)
TOTAL PROTEIN: 7.4 g/dL (ref 6.0–8.3)
Total Bilirubin: 1.5 mg/dL — ABNORMAL HIGH (ref 0.3–1.2)

## 2014-05-12 LAB — CBC WITH DIFFERENTIAL/PLATELET
Basophils Absolute: 0 10*3/uL (ref 0.0–0.1)
Basophils Relative: 0 % (ref 0–1)
EOS PCT: 0 % (ref 0–5)
Eosinophils Absolute: 0 10*3/uL (ref 0.0–0.7)
HEMATOCRIT: 35.5 % — AB (ref 39.0–52.0)
HEMOGLOBIN: 11.6 g/dL — AB (ref 13.0–17.0)
LYMPHS ABS: 1.2 10*3/uL (ref 0.7–4.0)
LYMPHS PCT: 18 % (ref 12–46)
MCH: 27.5 pg (ref 26.0–34.0)
MCHC: 32.7 g/dL (ref 30.0–36.0)
MCV: 84.1 fL (ref 78.0–100.0)
MONO ABS: 0.9 10*3/uL (ref 0.1–1.0)
MONOS PCT: 14 % — AB (ref 3–12)
NEUTROS ABS: 4.3 10*3/uL (ref 1.7–7.7)
Neutrophils Relative %: 68 % (ref 43–77)
Platelets: 140 10*3/uL — ABNORMAL LOW (ref 150–400)
RBC: 4.22 MIL/uL (ref 4.22–5.81)
RDW: 13.1 % (ref 11.5–15.5)
WBC: 6.4 10*3/uL (ref 4.0–10.5)

## 2014-05-12 LAB — URINALYSIS, ROUTINE W REFLEX MICROSCOPIC
Bilirubin Urine: NEGATIVE
Glucose, UA: NEGATIVE mg/dL
KETONES UR: 15 mg/dL — AB
NITRITE: POSITIVE — AB
PH: 5.5 (ref 5.0–8.0)
PROTEIN: 100 mg/dL — AB
Specific Gravity, Urine: 1.017 (ref 1.005–1.030)
Urobilinogen, UA: 1 mg/dL (ref 0.0–1.0)

## 2014-05-12 LAB — URINE MICROSCOPIC-ADD ON

## 2014-05-12 LAB — I-STAT CG4 LACTIC ACID, ED
LACTIC ACID, VENOUS: 1.81 mmol/L (ref 0.5–2.0)
Lactic Acid, Venous: 0.76 mmol/L (ref 0.5–2.0)

## 2014-05-12 MED ORDER — ACETAMINOPHEN 650 MG RE SUPP: 650.0000 mg | Freq: Four times a day (QID) | RECTAL | Status: DC | PRN

## 2014-05-12 MED ORDER — ENOXAPARIN SODIUM 30 MG/0.3ML ~~LOC~~ SOLN
30.0000 mg | Freq: Every day | SUBCUTANEOUS | Status: DC
  Administered 2014-05-12: 30 mg via SUBCUTANEOUS
  Filled 2014-05-12 (×2): qty 0.3

## 2014-05-12 MED ORDER — CEFTRIAXONE SODIUM 1 G IJ SOLR: 1.0000 g | INTRAMUSCULAR | Status: DC

## 2014-05-12 MED ORDER — ACETAMINOPHEN 500 MG PO TABS
1000.0000 mg | ORAL_TABLET | Freq: Once | ORAL | Status: AC
  Administered 2014-05-12: 1000 mg via ORAL
  Filled 2014-05-12: qty 2

## 2014-05-12 MED ORDER — ACETAMINOPHEN 325 MG PO TABS
650.0000 mg | ORAL_TABLET | Freq: Four times a day (QID) | ORAL | Status: DC | PRN
  Administered 2014-05-13 – 2014-05-17 (×8): 650 mg via ORAL
  Filled 2014-05-12 (×8): qty 2

## 2014-05-12 MED ORDER — DEXTROSE 5 % IV SOLN: 500.0000 mg | INTRAVENOUS | Status: DC

## 2014-05-12 MED ORDER — AZITHROMYCIN 500 MG IV SOLR
500.0000 mg | INTRAVENOUS | Status: DC
  Administered 2014-05-12 – 2014-05-14 (×3): 500 mg via INTRAVENOUS
  Filled 2014-05-12 (×5): qty 500

## 2014-05-12 MED ORDER — DEXTROSE 5 % IV SOLN: 500.0000 mg | Freq: Once | INTRAVENOUS | Status: DC

## 2014-05-12 MED ORDER — SODIUM CHLORIDE 0.9 % IV SOLN
INTRAVENOUS | Status: DC
  Administered 2014-05-12: 23:00:00 via INTRAVENOUS
  Administered 2014-05-13: 100 mL/h via INTRAVENOUS
  Administered 2014-05-13: 21:00:00 via INTRAVENOUS
  Administered 2014-05-14: 100 mL/h via INTRAVENOUS
  Administered 2014-05-15 – 2014-05-17 (×3): via INTRAVENOUS

## 2014-05-12 MED ORDER — ONDANSETRON HCL 4 MG/2ML IJ SOLN: 4.0000 mg | Freq: Four times a day (QID) | INTRAMUSCULAR | Status: DC | PRN

## 2014-05-12 MED ORDER — ALBUTEROL SULFATE (2.5 MG/3ML) 0.083% IN NEBU: 2.5000 mg | INHALATION_SOLUTION | Freq: Four times a day (QID) | RESPIRATORY_TRACT | Status: DC | PRN

## 2014-05-12 MED ORDER — DEXTROSE 5 % IV SOLN: 1.0000 g | Freq: Once | INTRAVENOUS | Status: DC

## 2014-05-12 MED ORDER — LEVOTHYROXINE SODIUM 125 MCG PO TABS
125.0000 ug | ORAL_TABLET | Freq: Every day | ORAL | Status: DC
  Administered 2014-05-13 – 2014-05-18 (×7): 125 ug via ORAL
  Filled 2014-05-12 (×9): qty 1

## 2014-05-12 MED ORDER — OXYCODONE HCL 5 MG PO TABS: 5.0000 mg | ORAL_TABLET | ORAL | Status: DC | PRN

## 2014-05-12 MED ORDER — ALUM & MAG HYDROXIDE-SIMETH 200-200-20 MG/5ML PO SUSP: 30.0000 mL | Freq: Four times a day (QID) | ORAL | Status: DC | PRN

## 2014-05-12 MED ORDER — ONDANSETRON HCL 4 MG PO TABS: 4.0000 mg | ORAL_TABLET | Freq: Four times a day (QID) | ORAL | Status: DC | PRN

## 2014-05-12 MED ORDER — DEXTROSE 5 % IV SOLN
1.0000 g | INTRAVENOUS | Status: DC
  Administered 2014-05-12 – 2014-05-14 (×3): 1 g via INTRAVENOUS
  Filled 2014-05-12 (×4): qty 10

## 2014-05-12 MED ORDER — HYDROMORPHONE HCL 1 MG/ML IJ SOLN: 0.5000 mg | INTRAMUSCULAR | Status: DC | PRN
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
## 2014-05-12 NOTE — ED Notes (Addendum)
[facility] with c/o fever and fatigue since Monday.  Pt is paraplegic and self-caths prn - reports was out of town for a basketball game on Sunday and cathed himself multiple times without washing his hands.  States he believes he has a UTI.  Pt is a&ox4 and in NAD.  Reported Temporal Temp from EMS is 102F

## 2014-05-12 NOTE — Progress Notes (Signed)
Received report from katie, RN in ED.  
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
## 2014-05-12 NOTE — ED Notes (Signed)
Dr. Ethelda ChickJacubowitz aware of Temp.

## 2014-05-12 NOTE — Progress Notes (Signed)
Pt arrived to unit alert and oriented x4. Oriented to room, unit, and staff.  Bed in lowest position and call bell is within reach. Will continue to monitor. 

## 2014-05-12 NOTE — ED Notes (Addendum)
No fluids to give at this time per Dr. Ethelda ChickJacubowitz. VSS.

## 2014-05-13 LAB — CBC
HEMATOCRIT: 32.8 % — AB (ref 39.0–52.0)
Hemoglobin: 10.6 g/dL — ABNORMAL LOW (ref 13.0–17.0)
MCH: 27.7 pg (ref 26.0–34.0)
MCHC: 32.3 g/dL (ref 30.0–36.0)
MCV: 85.6 fL (ref 78.0–100.0)
Platelets: 133 10*3/uL — ABNORMAL LOW (ref 150–400)
RBC: 3.83 MIL/uL — AB (ref 4.22–5.81)
RDW: 13.2 % (ref 11.5–15.5)
WBC: 6.6 10*3/uL (ref 4.0–10.5)

## 2014-05-13 LAB — BASIC METABOLIC PANEL
ANION GAP: 9 (ref 5–15)
BUN: 8 mg/dL (ref 6–23)
CALCIUM: 8.3 mg/dL — AB (ref 8.4–10.5)
CO2: 30 mmol/L (ref 19–32)
Chloride: 104 mmol/L (ref 96–112)
Creatinine, Ser: 1.04 mg/dL (ref 0.50–1.35)
GFR calc non Af Amer: 85 mL/min — ABNORMAL LOW (ref >90)
Glucose, Bld: 132 mg/dL — ABNORMAL HIGH (ref 70–99)
POTASSIUM: 3.7 mmol/L (ref 3.5–5.1)
SODIUM: 143 mmol/L (ref 135–145)

## 2014-05-13 LAB — MRSA PCR SCREENING: MRSA by PCR: NEGATIVE

## 2014-05-13 MED ORDER — ENOXAPARIN SODIUM 60 MG/0.6ML ~~LOC~~ SOLN
60.0000 mg | Freq: Every day | SUBCUTANEOUS | Status: DC
Start: 2014-05-13 — End: 2014-05-18
  Administered 2014-05-13 – 2014-05-17 (×5): 60 mg via SUBCUTANEOUS
  Filled 2014-05-13 (×6): qty 0.6

## 2014-05-13 MED ORDER — BENZONATATE 100 MG PO CAPS
100.0000 mg | ORAL_CAPSULE | Freq: Three times a day (TID) | ORAL | Status: DC | PRN
  Administered 2014-05-13 (×2): 100 mg via ORAL
  Filled 2014-05-13 (×3): qty 1
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
## 2014-05-14 LAB — BASIC METABOLIC PANEL
Anion gap: 7 (ref 5–15)
BUN: 7 mg/dL (ref 6–23)
CHLORIDE: 107 mmol/L (ref 96–112)
CO2: 28 mmol/L (ref 19–32)
Calcium: 8 mg/dL — ABNORMAL LOW (ref 8.4–10.5)
Creatinine, Ser: 1.01 mg/dL (ref 0.50–1.35)
GFR calc non Af Amer: 88 mL/min — ABNORMAL LOW (ref >90)
Glucose, Bld: 134 mg/dL — ABNORMAL HIGH (ref 70–99)
Potassium: 3.5 mmol/L (ref 3.5–5.1)
Sodium: 142 mmol/L (ref 135–145)

## 2014-05-14 LAB — CBC
HCT: 29.5 % — ABNORMAL LOW (ref 39.0–52.0)
HEMOGLOBIN: 9.6 g/dL — AB (ref 13.0–17.0)
MCH: 27.3 pg (ref 26.0–34.0)
MCHC: 32.5 g/dL (ref 30.0–36.0)
MCV: 83.8 fL (ref 78.0–100.0)
PLATELETS: 140 10*3/uL — AB (ref 150–400)
RBC: 3.52 MIL/uL — ABNORMAL LOW (ref 4.22–5.81)
RDW: 13.3 % (ref 11.5–15.5)
WBC: 9.1 10*3/uL (ref 4.0–10.5)

## 2014-05-14 LAB — INFLUENZA PANEL BY PCR (TYPE A & B)
H1N1 flu by pcr: NOT DETECTED
INFLAPCR: NEGATIVE
Influenza B By PCR: NEGATIVE

## 2014-05-14 LAB — HIV ANTIBODY (ROUTINE TESTING W REFLEX): HIV SCREEN 4TH GENERATION: NONREACTIVE

## 2014-05-14 MED ORDER — OSELTAMIVIR PHOSPHATE 75 MG PO CAPS
75.0000 mg | ORAL_CAPSULE | Freq: Two times a day (BID) | ORAL | Status: DC
  Administered 2014-05-14 – 2014-05-15 (×3): 75 mg via ORAL
  Filled 2014-05-14 (×4): qty 1
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
## 2014-05-15 LAB — URINE CULTURE: Colony Count: >=100000

## 2014-05-15 MED ORDER — DEXTROSE 5 % IV SOLN
2.0000 g | Freq: Two times a day (BID) | INTRAVENOUS | Status: DC
  Administered 2014-05-15 – 2014-05-18 (×6): 2 g via INTRAVENOUS
  Filled 2014-05-15 (×7): qty 2

## 2014-05-15 MED ORDER — SODIUM CHLORIDE 0.9 % IV SOLN
1250.0000 mg | Freq: Three times a day (TID) | INTRAVENOUS | Status: DC
  Administered 2014-05-16 – 2014-05-18 (×7): 1250 mg via INTRAVENOUS
  Filled 2014-05-15 (×8): qty 1250

## 2014-05-15 MED ORDER — VANCOMYCIN HCL 10 G IV SOLR
2500.0000 mg | Freq: Once | INTRAVENOUS | Status: AC
  Administered 2014-05-15: 2500 mg via INTRAVENOUS
  Filled 2014-05-15: qty 2500

## 2014-05-15 NOTE — Progress Notes (Signed)
Medicare Important Message given?  YES (If response is "NO", the following Medicare IM given date fields will be blank) Date Medicare IM given:05/15/14 Medicare IM given by:  Henlee Donovan 
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
## 2014-05-15 NOTE — Progress Notes (Signed)
Pt. Having  Episode of shaking. States he feels this way when his temp is high. Temperature 101.6. MD notified. New orders to d/c current iv antibiotics and to begin iv vancomycin and cefepime per pharmacy consult, draw stat blood cultures, and to not begin new antibiotic therapy until blood cultures have been drawn.
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
## 2014-05-16 ENCOUNTER — Inpatient Hospital Stay (HOSPITAL_COMMUNITY): Payer: Medicare Other

## 2014-05-16 ENCOUNTER — Encounter (HOSPITAL_COMMUNITY): Payer: Self-pay | Admitting: Radiology

## 2014-05-16 DIAGNOSIS — R509 Fever, unspecified: Secondary | ICD-10-CM

## 2014-05-16 DIAGNOSIS — J189 Pneumonia, unspecified organism: Secondary | ICD-10-CM

## 2014-05-16 MED ORDER — IOHEXOL 300 MG/ML  SOLN
100.0000 mL | Freq: Once | INTRAMUSCULAR | Status: AC | PRN
  Administered 2014-05-16: 100 mL via INTRAVENOUS

## 2014-05-16 MED ORDER — IOHEXOL 300 MG/ML  SOLN
25.0000 mL | INTRAMUSCULAR | Status: AC
  Administered 2014-05-16 (×2): 25 mL via ORAL

## 2014-05-16 NOTE — Progress Notes (Signed)
*  PRELIMINARY RESULTS* Vascular Ultrasound Lower extremity venous duplex has been completed.  Preliminary findings: no evidence of DVT  Farrel DemarkJill Eunice, RDMS, RVT  05/16/2014, 2:57 PM

## 2014-05-16 NOTE — Progress Notes (Signed)
Reviewed CT finding reflecting pneumonia in LLL, lingula and RML- cont Vanc and Cefepime.as Rocephin and Zithromax were ineffective in resolving fevers.   Calvert CantorSaima Johnavon Mcclafferty, MD

## 2014-05-16 NOTE — Care Management Note (Signed)
    Page 1 of 1   05/18/2014     3:34:51 PM CARE MANAGEMENT NOTE 05/18/2014  Patient:  [name],Fountain L   Account Number:  [id number]  Date Initiated:  05/16/2014  Documentation initiated by:  Letha CapeAYLOR,Beuna Bolding  Subjective/Objective Assessment:   dx sepsis, fevers  admit- lives alone. patient is bedbound and w/chair bound.     Action/Plan:   Anticipated DC Date:  05/18/2014   Anticipated DC Plan:  HOME/SELF CARE      DC Planning Services  CM consult      Choice offered to / List presented to:             Status of service:  Completed, signed off Medicare Important Message given?  YES (If response is "NO", the following Medicare IM given date fields will be blank) Date Medicare IM given:  05/15/2014 Medicare IM given by:  Letha CapeAYLOR,Quisha Mabie Date Additional Medicare IM given:  05/18/2014 Additional Medicare IM given by:  Letha CapeEBORAH Juni Glaab  Discharge Disposition:  HOME/SELF CARE  Per UR Regulation:  Reviewed for med. necessity/level of care/duration of stay  If discussed at Long Length of Stay Meetings, dates discussed:    Comments:  05/18/14 1533 Letha Capeeborah Karandeep Resende RN, BSN [phone] No needs, pateint dc.  05/16/14 1729 Letha Capeeborah Macrae Wiegman RN,BSN [phone] NCM will cont to follow for dc needs.
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
## 2014-05-17 DIAGNOSIS — A4151 Sepsis due to Escherichia coli [E. coli]: Secondary | ICD-10-CM

## 2014-05-17 LAB — CREATININE, SERUM
Creatinine, Ser: 0.99 mg/dL (ref 0.50–1.35)
GFR calc Af Amer: >90 mL/min (ref >90)
GFR calc non Af Amer: >90 mL/min (ref >90)
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
## 2014-05-18 MED ORDER — LEVOFLOXACIN 750 MG PO TABS: 750.0000 mg | ORAL_TABLET | Freq: Every day | ORAL | Status: DC

## 2014-05-18 MED ORDER — DOXYCYCLINE HYCLATE 100 MG PO TABS
100.0000 mg | ORAL_TABLET | Freq: Two times a day (BID) | ORAL | Status: DC
  Administered 2014-05-18: 100 mg via ORAL
  Filled 2014-05-18 (×2): qty 1

## 2014-05-18 MED ORDER — DOXYCYCLINE HYCLATE 100 MG PO TABS: 100.0000 mg | ORAL_TABLET | Freq: Two times a day (BID) | ORAL | Status: DC

## 2014-05-18 MED ORDER — CIPROFLOXACIN HCL 500 MG PO TABS: 500.0000 mg | ORAL_TABLET | Freq: Two times a day (BID) | ORAL | Status: DC

## 2014-05-18 NOTE — Progress Notes (Signed)
[facility], MD made aware.

## 2014-05-18 NOTE — Progress Notes (Signed)
NURSING PROGRESS NOTE  [name] [id number] Discharge Data: 05/18/2014 1:25 PM Attending Provider: Calvert CantorSaima Rizwan, MD PCP:Pcp Not In System     [name] to be D/C'd Home per MD order.  Discussed with the patient the After Visit Summary and all questions fully answered. All IV's discontinued with no bleeding noted. All belongings returned to patient for patient to take home.   Last Vital Signs:  Blood pressure 132/75, pulse 88, temperature 98.3 F (36.8 C), temperature source Oral, resp. rate 20, height 6\' 6"  (1.981 m), weight 127.007 kg (280 lb), SpO2 96 %.  Discharge Medication List   Medication List    TAKE these medications        ciprofloxacin 500 MG tablet  Commonly known as:  CIPRO  Take 1 tablet (500 mg total) by mouth 2 (two) times daily.     doxycycline 100 MG tablet  Commonly known as:  VIBRA-TABS  Take 1 tablet (100 mg total) by mouth every 12 (twelve) hours.     levothyroxine 125 MCG tablet  Commonly known as:  SYNTHROID, LEVOTHROID  Take 125 mcg by mouth daily before breakfast.

## 2014-05-18 NOTE — Progress Notes (Signed)
Medicare Important Message given?  YES (If response is "NO", the following Medicare IM given date fields will be blank) Date Medicare IM given:  05/18/14 Medicare IM given by:  Nimrat Woolworth 
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
## 2014-05-19 LAB — CULTURE, BLOOD (ROUTINE X 2)
CULTURE: NO GROWTH
Culture: NO GROWTH

## 2014-05-22 LAB — CULTURE, BLOOD (SINGLE): CULTURE: NO GROWTH

## 2014-05-31 DIAGNOSIS — G8221 Paraplegia, complete: Secondary | ICD-10-CM | POA: Diagnosis not present

## 2014-05-31 DIAGNOSIS — D508 Other iron deficiency anemias: Secondary | ICD-10-CM | POA: Diagnosis not present

## 2014-05-31 DIAGNOSIS — J84115 Respiratory bronchiolitis interstitial lung disease: Secondary | ICD-10-CM | POA: Diagnosis not present

## 2014-05-31 DIAGNOSIS — E782 Mixed hyperlipidemia: Secondary | ICD-10-CM | POA: Diagnosis not present

## 2014-05-31 DIAGNOSIS — A419 Sepsis, unspecified organism: Secondary | ICD-10-CM | POA: Diagnosis not present

## 2014-05-31 DIAGNOSIS — D510 Vitamin B12 deficiency anemia due to intrinsic factor deficiency: Secondary | ICD-10-CM | POA: Diagnosis not present

## 2014-09-01 DIAGNOSIS — E669 Obesity, unspecified: Secondary | ICD-10-CM | POA: Diagnosis not present

## 2014-09-01 DIAGNOSIS — G8221 Paraplegia, complete: Secondary | ICD-10-CM | POA: Diagnosis not present

## 2014-09-01 DIAGNOSIS — J398 Other specified diseases of upper respiratory tract: Secondary | ICD-10-CM | POA: Diagnosis not present

## 2014-09-01 DIAGNOSIS — E6609 Other obesity due to excess calories: Secondary | ICD-10-CM | POA: Diagnosis not present

## 2014-09-01 DIAGNOSIS — E039 Hypothyroidism, unspecified: Secondary | ICD-10-CM | POA: Diagnosis not present

## 2014-09-01 DIAGNOSIS — H672 Otitis media in diseases classified elsewhere, left ear: Secondary | ICD-10-CM | POA: Diagnosis not present

## 2014-10-10 DIAGNOSIS — E039 Hypothyroidism, unspecified: Secondary | ICD-10-CM | POA: Diagnosis not present

## 2014-10-10 DIAGNOSIS — E6609 Other obesity due to excess calories: Secondary | ICD-10-CM | POA: Diagnosis not present

## 2014-10-10 DIAGNOSIS — G8221 Paraplegia, complete: Secondary | ICD-10-CM | POA: Diagnosis not present

## 2014-12-14 ENCOUNTER — Encounter: Payer: Self-pay | Admitting: Physical Therapy

## 2014-12-14 ENCOUNTER — Ambulatory Visit: Payer: Medicare Other | Attending: Family Medicine | Admitting: Physical Therapy

## 2014-12-14 DIAGNOSIS — R531 Weakness: Secondary | ICD-10-CM | POA: Insufficient documentation

## 2014-12-14 DIAGNOSIS — G8220 Paraplegia, unspecified: Secondary | ICD-10-CM

## 2014-12-14 NOTE — Therapy (Signed)
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 Phone:  [phone]  Fax:  [phone] [facility] [place], [place] [address]    [address], [id number] [place]   Fax:  [phone]

## 2015-01-18 DIAGNOSIS — N529 Male erectile dysfunction, unspecified: Secondary | ICD-10-CM | POA: Diagnosis not present

## 2015-01-18 DIAGNOSIS — N319 Neuromuscular dysfunction of bladder, unspecified: Secondary | ICD-10-CM | POA: Diagnosis not present

## 2015-01-18 DIAGNOSIS — N35013 Post-traumatic anterior urethral stricture: Secondary | ICD-10-CM | POA: Diagnosis not present

## 2015-01-18 DIAGNOSIS — N359 Urethral stricture, unspecified: Secondary | ICD-10-CM | POA: Diagnosis not present

## 2015-01-19 DIAGNOSIS — G8220 Paraplegia, unspecified: Secondary | ICD-10-CM | POA: Diagnosis not present

## 2015-01-19 DIAGNOSIS — G8221 Paraplegia, complete: Secondary | ICD-10-CM | POA: Diagnosis not present

## 2015-01-19 DIAGNOSIS — E038 Other specified hypothyroidism: Secondary | ICD-10-CM | POA: Diagnosis not present

## 2015-04-16 DIAGNOSIS — D508 Other iron deficiency anemias: Secondary | ICD-10-CM | POA: Diagnosis not present

## 2015-04-16 DIAGNOSIS — G8220 Paraplegia, unspecified: Secondary | ICD-10-CM | POA: Diagnosis not present

## 2015-04-16 DIAGNOSIS — E6609 Other obesity due to excess calories: Secondary | ICD-10-CM | POA: Diagnosis not present

## 2015-04-16 DIAGNOSIS — E039 Hypothyroidism, unspecified: Secondary | ICD-10-CM | POA: Diagnosis not present

## 2015-04-16 DIAGNOSIS — N390 Urinary tract infection, site not specified: Secondary | ICD-10-CM | POA: Diagnosis not present

## 2015-05-14 DIAGNOSIS — E038 Other specified hypothyroidism: Secondary | ICD-10-CM | POA: Diagnosis not present

## 2015-05-14 DIAGNOSIS — G8221 Paraplegia, complete: Secondary | ICD-10-CM | POA: Diagnosis not present

## 2015-05-14 DIAGNOSIS — N390 Urinary tract infection, site not specified: Secondary | ICD-10-CM | POA: Diagnosis not present

## 2015-05-14 DIAGNOSIS — J301 Allergic rhinitis due to pollen: Secondary | ICD-10-CM | POA: Diagnosis not present

## 2015-06-27 DIAGNOSIS — H10413 Chronic giant papillary conjunctivitis, bilateral: Secondary | ICD-10-CM | POA: Diagnosis not present

## 2015-06-27 DIAGNOSIS — H04123 Dry eye syndrome of bilateral lacrimal glands: Secondary | ICD-10-CM | POA: Diagnosis not present

## 2015-08-14 DIAGNOSIS — G8220 Paraplegia, unspecified: Secondary | ICD-10-CM | POA: Diagnosis not present

## 2015-08-14 DIAGNOSIS — E038 Other specified hypothyroidism: Secondary | ICD-10-CM | POA: Diagnosis not present

## 2015-08-14 DIAGNOSIS — E6609 Other obesity due to excess calories: Secondary | ICD-10-CM | POA: Diagnosis not present

## 2015-08-14 DIAGNOSIS — Z79899 Other long term (current) drug therapy: Secondary | ICD-10-CM | POA: Diagnosis not present

## 2015-12-10 DIAGNOSIS — G8221 Paraplegia, complete: Secondary | ICD-10-CM | POA: Diagnosis not present

## 2015-12-10 DIAGNOSIS — E039 Hypothyroidism, unspecified: Secondary | ICD-10-CM | POA: Diagnosis not present

## 2015-12-10 DIAGNOSIS — E6609 Other obesity due to excess calories: Secondary | ICD-10-CM | POA: Diagnosis not present

## 2016-01-18 DIAGNOSIS — N529 Male erectile dysfunction, unspecified: Secondary | ICD-10-CM | POA: Diagnosis not present

## 2016-01-18 DIAGNOSIS — N35013 Post-traumatic anterior urethral stricture: Secondary | ICD-10-CM | POA: Diagnosis not present

## 2016-01-18 DIAGNOSIS — N319 Neuromuscular dysfunction of bladder, unspecified: Secondary | ICD-10-CM | POA: Diagnosis not present

## 2016-01-22 DIAGNOSIS — N319 Neuromuscular dysfunction of bladder, unspecified: Secondary | ICD-10-CM | POA: Diagnosis not present

## 2016-02-11 DIAGNOSIS — E039 Hypothyroidism, unspecified: Secondary | ICD-10-CM | POA: Diagnosis not present

## 2016-02-11 DIAGNOSIS — E6609 Other obesity due to excess calories: Secondary | ICD-10-CM | POA: Diagnosis not present

## 2016-02-11 DIAGNOSIS — E038 Other specified hypothyroidism: Secondary | ICD-10-CM | POA: Diagnosis not present

## 2016-02-11 DIAGNOSIS — G8220 Paraplegia, unspecified: Secondary | ICD-10-CM | POA: Diagnosis not present

## 2016-02-11 DIAGNOSIS — R339 Retention of urine, unspecified: Secondary | ICD-10-CM | POA: Diagnosis not present

## 2016-02-18 DIAGNOSIS — R5383 Other fatigue: Secondary | ICD-10-CM | POA: Diagnosis not present

## 2016-02-18 DIAGNOSIS — E038 Other specified hypothyroidism: Secondary | ICD-10-CM | POA: Diagnosis not present

## 2016-02-18 DIAGNOSIS — R339 Retention of urine, unspecified: Secondary | ICD-10-CM | POA: Diagnosis not present

## 2016-02-18 DIAGNOSIS — G8221 Paraplegia, complete: Secondary | ICD-10-CM | POA: Diagnosis not present

## 2016-02-18 DIAGNOSIS — Z79899 Other long term (current) drug therapy: Secondary | ICD-10-CM | POA: Diagnosis not present

## 2016-04-08 DIAGNOSIS — G8220 Paraplegia, unspecified: Secondary | ICD-10-CM | POA: Diagnosis not present

## 2016-04-08 DIAGNOSIS — E039 Hypothyroidism, unspecified: Secondary | ICD-10-CM | POA: Diagnosis not present

## 2016-04-08 DIAGNOSIS — R635 Abnormal weight gain: Secondary | ICD-10-CM | POA: Diagnosis not present

## 2016-05-20 DIAGNOSIS — Z125 Encounter for screening for malignant neoplasm of prostate: Secondary | ICD-10-CM | POA: Diagnosis not present

## 2016-05-20 DIAGNOSIS — G8221 Paraplegia, complete: Secondary | ICD-10-CM | POA: Diagnosis not present

## 2016-05-20 DIAGNOSIS — E039 Hypothyroidism, unspecified: Secondary | ICD-10-CM | POA: Diagnosis not present

## 2016-05-20 DIAGNOSIS — R635 Abnormal weight gain: Secondary | ICD-10-CM | POA: Diagnosis not present

## 2016-07-02 DIAGNOSIS — G8220 Paraplegia, unspecified: Secondary | ICD-10-CM | POA: Diagnosis not present

## 2016-07-02 DIAGNOSIS — E039 Hypothyroidism, unspecified: Secondary | ICD-10-CM | POA: Diagnosis not present

## 2016-07-02 DIAGNOSIS — E291 Testicular hypofunction: Secondary | ICD-10-CM | POA: Diagnosis not present

## 2016-07-02 DIAGNOSIS — E031 Congenital hypothyroidism without goiter: Secondary | ICD-10-CM | POA: Diagnosis not present

## 2016-08-13 DIAGNOSIS — D509 Iron deficiency anemia, unspecified: Secondary | ICD-10-CM | POA: Diagnosis not present

## 2016-12-17 DIAGNOSIS — R635 Abnormal weight gain: Secondary | ICD-10-CM | POA: Diagnosis not present

## 2016-12-17 DIAGNOSIS — G8220 Paraplegia, unspecified: Secondary | ICD-10-CM | POA: Diagnosis not present

## 2016-12-17 DIAGNOSIS — G8221 Paraplegia, complete: Secondary | ICD-10-CM | POA: Diagnosis not present

## 2016-12-17 DIAGNOSIS — E038 Other specified hypothyroidism: Secondary | ICD-10-CM | POA: Diagnosis not present

## 2016-12-18 DIAGNOSIS — G8220 Paraplegia, unspecified: Secondary | ICD-10-CM | POA: Diagnosis not present

## 2016-12-24 ENCOUNTER — Emergency Department (HOSPITAL_COMMUNITY)
Admission: EM | Admit: 2016-12-24 | Discharge: 2016-12-24 | Disposition: A | Discharge status: Home / Self Care | Payer: Medicare Other | Attending: Emergency Medicine | Admitting: Emergency Medicine

## 2016-12-24 ENCOUNTER — Emergency Department (HOSPITAL_COMMUNITY): Payer: Medicare Other

## 2016-12-24 DIAGNOSIS — R079 Chest pain, unspecified: Secondary | ICD-10-CM | POA: Diagnosis not present

## 2016-12-24 DIAGNOSIS — M7918 Myalgia, other site: Secondary | ICD-10-CM

## 2016-12-24 DIAGNOSIS — M791 Myalgia, unspecified site: Secondary | ICD-10-CM | POA: Insufficient documentation

## 2016-12-24 LAB — BASIC METABOLIC PANEL
Anion gap: 5 (ref 5–15)
BUN: 15 mg/dL (ref 6–20)
CHLORIDE: 111 mmol/L (ref 101–111)
CO2: 26 mmol/L (ref 22–32)
CREATININE: 0.91 mg/dL (ref 0.61–1.24)
Calcium: 9 mg/dL (ref 8.9–10.3)
GFR calc non Af Amer: >60 mL/min (ref >60)
Glucose, Bld: 113 mg/dL — ABNORMAL HIGH (ref 65–99)
Potassium: 3.6 mmol/L (ref 3.5–5.1)
Sodium: 142 mmol/L (ref 135–145)

## 2016-12-24 LAB — CBC
HEMATOCRIT: 35.6 % — AB (ref 39.0–52.0)
Hemoglobin: 11.3 g/dL — ABNORMAL LOW (ref 13.0–17.0)
MCH: 27.4 pg (ref 26.0–34.0)
MCHC: 31.7 g/dL (ref 30.0–36.0)
MCV: 86.4 fL (ref 78.0–100.0)
Platelets: 188 10*3/uL (ref 150–400)
RBC: 4.12 MIL/uL — ABNORMAL LOW (ref 4.22–5.81)
RDW: 13.2 % (ref 11.5–15.5)
WBC: 4.9 10*3/uL (ref 4.0–10.5)

## 2016-12-24 LAB — I-STAT TROPONIN, ED: Troponin i, poc: 0 ng/mL (ref 0.00–0.08)

## 2016-12-24 MED ORDER — IBUPROFEN 600 MG PO TABS: 600.0000 mg | ORAL_TABLET | Freq: Four times a day (QID) | ORAL | 0 refills | Status: DC | PRN

## 2016-12-24 NOTE — Discharge Instructions (Signed)
Please read and follow all provided instructions.  Your diagnoses today include:  1. Chest pain, unspecified type   2. Musculoskeletal pain     Tests performed today include: An EKG of your heart A chest x-ray Cardiac enzymes - a blood test for heart muscle damage Blood counts and electrolytes Vital signs. See below for your results today.   Medications prescribed:   Take any prescribed medications only as directed.  Follow-up instructions: Please follow-up with your primary care provider as soon as you can for further evaluation of your symptoms.   Return instructions:  SEEK IMMEDIATE MEDICAL ATTENTION IF: You have severe chest pain, especially if the pain is crushing or pressure-like and spreads to the arms, back, neck, or jaw, or if you have sweating, nausea (feeling sick to your stomach), or shortness of breath. THIS IS AN EMERGENCY. Don't wait to see if the pain will go away. Get medical help at once. Call 911 or 0 (operator). DO NOT drive yourself to the hospital.  Your chest pain gets worse and does not go away with rest.  You have an attack of chest pain lasting longer than usual, despite rest and treatment with the medications your caregiver has prescribed.  You wake from sleep with chest pain or shortness of breath. You feel dizzy or faint. You have chest pain not typical of your usual pain for which you originally saw your caregiver.  You have any other emergent concerns regarding your health.  Additional Information: Chest pain comes from many different causes. Your caregiver has diagnosed you as having chest pain that is not specific for one problem, but does not require admission.  You are at low risk for an acute heart condition or other serious illness.   Your vital signs today were: BP 116/73 (BP Location: Right Arm)    Pulse 65    Temp (!) 97.5 F (36.4 C) (Oral)    Resp 17    SpO2 99%  If your blood pressure (BP) was elevated above 135/85 this visit, please  have this repeated by your doctor within one month. --------------

## 2016-12-24 NOTE — ED Notes (Signed)
Got patient on the monitor patient is resting with call bell at reach

## 2016-12-24 NOTE — ED Notes (Signed)
Pt stable and expresses understanding of D/C instructions

## 2016-12-24 NOTE — ED Notes (Addendum)
Pt endorses periodic episodes of sob

## 2016-12-24 NOTE — ED Triage Notes (Signed)
To ED for eval of intermittent chest pressure for the past couple of days. Pt states the pressure starts for no reason-stays for couple of seconds-and leaves. No n/v/d. Appears in nad. Pt states he is active and plays wheelchair basketball regularly
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
## 2017-03-27 ENCOUNTER — Encounter: Payer: Self-pay | Admitting: Family Medicine

## 2017-03-27 ENCOUNTER — Other Ambulatory Visit: Payer: Self-pay | Admitting: Family Medicine
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
## 2017-04-09 ENCOUNTER — Ambulatory Visit (INDEPENDENT_AMBULATORY_CARE_PROVIDER_SITE_OTHER): Payer: Medicare Other | Admitting: Family Medicine

## 2017-04-09 ENCOUNTER — Other Ambulatory Visit: Payer: Self-pay

## 2017-04-09 ENCOUNTER — Encounter: Payer: Self-pay | Admitting: Family Medicine

## 2017-04-09 VITALS — BP 118/74 | HR 68 | Temp 98.1°F | Ht 78.0 in | Wt 280.0 lb

## 2017-04-09 DIAGNOSIS — E039 Hypothyroidism, unspecified: Secondary | ICD-10-CM | POA: Diagnosis not present

## 2017-04-09 DIAGNOSIS — D649 Anemia, unspecified: Secondary | ICD-10-CM

## 2017-04-09 NOTE — Patient Instructions (Signed)
It was great to see you!  For your hypothyroidism,  - We are checking some labs today, we will call you or send you a letter if they are abnormal.   Take care and seek immediate care sooner if you develop any concerns.   Ellwood DenseAlison Rumball, DO [facility]

## 2017-04-10 LAB — IRON AND TIBC
IRON: 60 ug/dL (ref 38–169)
Iron Saturation: 24 % (ref 15–55)
TIBC: 251 ug/dL (ref 250–450)
UIBC: 191 ug/dL (ref 111–343)

## 2017-04-10 LAB — CBC
Hematocrit: 33 % — ABNORMAL LOW (ref 37.5–51.0)
Hemoglobin: 10.5 g/dL — ABNORMAL LOW (ref 13.0–17.7)
MCH: 27.1 pg (ref 26.6–33.0)
MCHC: 31.8 g/dL (ref 31.5–35.7)
MCV: 85 fL (ref 79–97)
Platelets: 216 10*3/uL (ref 150–379)
RBC: 3.88 x10E6/uL — ABNORMAL LOW (ref 4.14–5.80)
RDW: 13.8 % (ref 12.3–15.4)
WBC: 4.3 10*3/uL (ref 3.4–10.8)

## 2017-04-10 LAB — TSH: TSH: 11.27 u[IU]/mL — ABNORMAL HIGH (ref 0.450–4.500)

## 2017-04-10 LAB — FERRITIN: FERRITIN: 222 ng/mL (ref 30–400)

## 2017-04-16 ENCOUNTER — Other Ambulatory Visit: Payer: Self-pay | Admitting: Family Medicine

## 2017-04-16 DIAGNOSIS — E039 Hypothyroidism, unspecified: Secondary | ICD-10-CM

## 2017-04-16 NOTE — Progress Notes (Signed)
Tried to contact patient, no answer and no way to leave message.

## 2017-04-20 ENCOUNTER — Telehealth: Payer: Self-pay | Admitting: *Deleted

## 2017-04-20 ENCOUNTER — Encounter: Payer: Self-pay | Admitting: *Deleted

## 2017-04-20 NOTE — Telephone Encounter (Signed)
-----   Message from Ellwood DenseAlison Rumball, DO sent at 04/16/2017  9:44 AM EST ----- Called patient to inform of lab results, no answer and no ability to leave message.  Patient will need further lab work to work up elevated thyroid level, please have him make lab appointment. Order placed for T3, T4. Anemia panel was within normal limits, no need for treatment unless becomes symptomatic (shortness of breath, chest pain, excess fatigue). Happy to speak to him if he has any questions. Jill SideAlison

## 2017-04-20 NOTE — Telephone Encounter (Signed)
LM for patient to call back.  Will relay lab results when he does.  I also mailed a letter with his results and next plan of care. Jazmin Hartsell,CMA

## 2017-04-22 NOTE — Telephone Encounter (Signed)
Patient informed of results and lab appt made for 04/23/17. Theresa Dohrman,CMA

## 2017-04-22 NOTE — Telephone Encounter (Signed)
Patient left message on nurse line. Returning call to Jazmin. Call back is [phone]. Ples SpecterAlisa Brake, RN [facility] RN)

## 2017-04-23 ENCOUNTER — Other Ambulatory Visit: Payer: Medicare Other

## 2017-04-23 DIAGNOSIS — E039 Hypothyroidism, unspecified: Secondary | ICD-10-CM

## 2017-04-24 LAB — T4, FREE: Free T4: 0.96 ng/dL (ref 0.82–1.77)

## 2017-04-24 LAB — T3, FREE: T3, Free: 2.8 pg/mL (ref 2.0–4.4)

## 2017-04-27 ENCOUNTER — Telehealth: Payer: Self-pay | Admitting: *Deleted

## 2017-04-27 NOTE — Telephone Encounter (Signed)
Patient informed of results.  He did ask what he could take to help boost his energy to support his active lifestyle.  I suggested to patient to try adding a one a day multivitamin and see if this helps him.  Patient voiced understanding. Jahna Liebert,CMA

## 2017-04-27 NOTE — Telephone Encounter (Signed)
-----   Message from [name] DenseAlison Rumball, DO sent at 04/25/2017  8:36 PM EST ----- Please let patient know that thyroid levels are within normal range and do not require medication at this time. If he has any questions will be happy to take his call. --Jill SideAlison

## 2017-05-01 DIAGNOSIS — N529 Male erectile dysfunction, unspecified: Secondary | ICD-10-CM | POA: Diagnosis not present

## 2017-05-01 DIAGNOSIS — N319 Neuromuscular dysfunction of bladder, unspecified: Secondary | ICD-10-CM | POA: Diagnosis not present

## 2017-05-01 DIAGNOSIS — N35013 Post-traumatic anterior urethral stricture: Secondary | ICD-10-CM | POA: Diagnosis not present

## 2017-10-18 NOTE — Progress Notes (Deleted)
  Subjective:   Patient ID: [name]    DOB: 05/02/1970, 47 y.o. male   MRN: [id number]  [name] is a 47 y.o. male with a history of Paraplegia, hypothyroidism, neurogenic bladder here for   Wheelchair replacement - ***  - needs tetanus  Review of Systems:  Per HPI.   PMFSH, medications and smoking status reviewed.  Objective:   There were no vitals taken for this visit. Vitals and nursing note reviewed.  General: well nourished, well developed, in no acute distress with non-toxic appearance HEENT: normocephalic, atraumatic, moist mucous membranes Neck: supple, non-tender without lymphadenopathy CV: regular rate and rhythm without murmurs, rubs, or gallops, no lower extremity edema Lungs: clear to auscultation bilaterally with normal work of breathing Abdomen: soft, non-tender, non-distended, no masses or organomegaly palpable, normoactive bowel sounds Skin: warm, dry, no rashes or lesions Extremities: warm and well perfused, normal tone MSK: ROM grossly intact, strength intact, gait normal Neuro: Alert and oriented, speech normal  Assessment & Plan:   No problem-specific Assessment & Plan notes found for this encounter.  No orders of the defined types were placed in this encounter.  No orders of the defined types were placed in this encounter.   Ellwood DenseAlison Rumball, DO PGY-2, [facility] 10/18/2017 4:16 PM

## 2017-10-20 ENCOUNTER — Ambulatory Visit: Payer: Medicare Other | Admitting: Family Medicine

## 2017-10-21 ENCOUNTER — Encounter: Payer: Self-pay | Admitting: Family Medicine

## 2017-10-21 ENCOUNTER — Ambulatory Visit (INDEPENDENT_AMBULATORY_CARE_PROVIDER_SITE_OTHER): Payer: Medicare Other | Admitting: Family Medicine

## 2017-10-21 ENCOUNTER — Other Ambulatory Visit: Payer: Self-pay

## 2017-10-21 DIAGNOSIS — G8220 Paraplegia, unspecified: Secondary | ICD-10-CM
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
## 2017-11-05 DIAGNOSIS — H524 Presbyopia: Secondary | ICD-10-CM | POA: Diagnosis not present

## 2017-11-05 DIAGNOSIS — H5212 Myopia, left eye: Secondary | ICD-10-CM | POA: Diagnosis not present

## 2017-12-10 ENCOUNTER — Telehealth: Payer: Self-pay | Admitting: Family Medicine

## 2017-12-10 NOTE — Telephone Encounter (Signed)
Pt called to inform Dr. Linwood Dibbles that he will be dropping of paperwork. This is a form explain the sports league he is wanting to participate in for wheelchair sports. After reading the form he would like to know if Dr. Linwood Dibbles could please write a note explaining that he is in a wheelchair and able to participate. He would like the note left up front and for someone to call him when it is ready to be picked up.

## 2017-12-10 NOTE — Telephone Encounter (Signed)
Patient brought letter to office for pcp. Placed in [employer] mail box. If any questions call patient at [phone]

## 2017-12-11 ENCOUNTER — Encounter: Payer: Self-pay | Admitting: Family Medicine

## 2017-12-11 NOTE — Telephone Encounter (Signed)
Letter completed and placed up front for patient pick up.

## 2017-12-22 NOTE — Telephone Encounter (Signed)
Will forward to MD. Marcel Sorter,CMA  

## 2017-12-22 NOTE — Telephone Encounter (Signed)
Patient picked up letter today but still needs the doctor or nurse to call [facility] at [phone] to answer questions in order to okay his medical supplies to come to him.  Thank you

## 2017-12-22 NOTE — Telephone Encounter (Signed)
Spoke to [employer], gave verbal orders for supplies. They will be faxing over confirmation prescription and will need last office note attached. Will need to make sure to add he uses 6 straight in-and-out caths per day for urinary retention, 1 condom cath at night with leg bag for incontinence. Will fill out once faxed. Must be signed by an attending.  Spoke to [place], extension [phone].

## 2018-01-04 NOTE — Progress Notes (Addendum)
For urinary retention (ICD R33.9), patient uses 6 straight in-and-out caths per day and 1 condom cath at night with leg bag for urinary incontinence (ICD R39.81).   Ellwood Dense, DO PGY-2, [facility]

## 2018-04-16 ENCOUNTER — Telehealth: Payer: Self-pay | Admitting: *Deleted

## 2018-04-16 NOTE — Telephone Encounter (Signed)
Pt calls and states that [employer] supply never received his OVN from August.  Advised that I see no mention of that request in chart, but I would send now if he wanted me to.   Faxed to [phone]. [place], Maryjo Rochester, CMA

## 2018-07-28 DIAGNOSIS — N319 Neuromuscular dysfunction of bladder, unspecified: Secondary | ICD-10-CM | POA: Diagnosis not present

## 2018-07-28 DIAGNOSIS — N35013 Post-traumatic anterior urethral stricture: Secondary | ICD-10-CM | POA: Diagnosis not present

## 2018-07-28 DIAGNOSIS — N529 Male erectile dysfunction, unspecified: Secondary | ICD-10-CM | POA: Diagnosis not present

## 2019-05-04 ENCOUNTER — Encounter: Payer: Self-pay | Admitting: Family Medicine

## 2019-05-04 ENCOUNTER — Ambulatory Visit (INDEPENDENT_AMBULATORY_CARE_PROVIDER_SITE_OTHER): Payer: Medicare Other | Admitting: Family Medicine

## 2019-05-04 ENCOUNTER — Other Ambulatory Visit: Payer: Self-pay

## 2019-05-04 VITALS — BP 128/82 | HR 83 | Ht 78.0 in

## 2019-05-04 DIAGNOSIS — R7309 Other abnormal glucose: Secondary | ICD-10-CM | POA: Diagnosis not present

## 2019-05-04 DIAGNOSIS — Z1211 Encounter for screening for malignant neoplasm of colon: Secondary | ICD-10-CM

## 2019-05-04 DIAGNOSIS — E039 Hypothyroidism, unspecified: Secondary | ICD-10-CM

## 2019-05-04 DIAGNOSIS — D649 Anemia, unspecified: Secondary | ICD-10-CM | POA: Insufficient documentation

## 2019-05-04 DIAGNOSIS — G8220 Paraplegia, unspecified: Secondary | ICD-10-CM | POA: Diagnosis not present

## 2019-05-04 NOTE — Assessment & Plan Note (Signed)
Previous TSH 04/2017 elevated at 11.2, at that time had been off of his Synthroid for about 3 months.  Currently symptoms.  Will recheck TSH today and treat as indicated.  Follow-up frequency depend on results.

## 2019-05-04 NOTE — Assessment & Plan Note (Signed)
Doing well.  Continues with self caths without symptoms concerning for infection.  Continue to follow with urology.

## 2019-05-04 NOTE — Assessment & Plan Note (Signed)
Likely anemia of chronic disease given prior normal MCV and anemia panel as well as history of neurologic injury.  Currently denies symptoms.  Will recheck CBC today.
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
## 2019-05-04 NOTE — Assessment & Plan Note (Signed)
BMI 32 today.  Does play [employer] basketball.  No family history of HTN, DM2, HLD.  Will check screening lipid panel and A1c today.

## 2019-05-04 NOTE — Patient Instructions (Addendum)
It was great to see you!  Our plans for today:  - Try [facility] for compression stockings to wear during the day. They are located at [address].  [phone]. - We are referring you to GI for a screening colonoscopy. - We are checking some labs today, we will call you or send you a letter with these results.  Take care and seek immediate care sooner if you develop any concerns.   Dr. [facility]

## 2019-05-05 LAB — HEMOGLOBIN A1C
Est. average glucose Bld gHb Est-mCnc: 103 mg/dL
Hgb A1c MFr Bld: 5.2 % (ref 4.8–5.6)

## 2019-05-05 MED ORDER — LEVOTHYROXINE SODIUM 25 MCG PO TABS: 25.0000 ug | ORAL_TABLET | Freq: Every day | ORAL | 0 refills | Status: DC

## 2019-05-05 NOTE — Addendum Note (Signed)
Addended by: Caro Laroche on: 05/05/2019 05:43 PM   Modules accepted: Orders

## 2019-05-06 ENCOUNTER — Encounter: Payer: Self-pay | Admitting: Gastroenterology

## 2019-05-07 LAB — CBC

## 2019-05-10 ENCOUNTER — Telehealth: Payer: Self-pay

## 2019-05-10 NOTE — Telephone Encounter (Signed)
Patient calls nurse line stating he has been summoned for Jury Duty for 3/23. Patient stated every few years I have to go through this. Patient is requesting a permanent excuse from [employer] due to disability. Please advise.

## 2019-05-11 NOTE — Telephone Encounter (Signed)
His paraplegia shouldn't preclude him from serving on jury duty as they should have wheelchair access. If he is referencing a different disability or reason why, then we can consider but as of right now, I don't see why he would need to be excused.  Happy to answer any questions he has.

## 2019-05-12 NOTE — Telephone Encounter (Signed)
Attempted to reach pt. At nursing home. Left VM for pt to call the office regarding his question for jury duty. Aquilla Solian, CMA

## 2019-05-13 LAB — LIPID PANEL
Chol/HDL Ratio: 3.6 ratio (ref 0.0–5.0)
Cholesterol, Total: 144 mg/dL (ref 100–199)
HDL: 40 mg/dL (ref >39)
LDL Chol Calc (NIH): 90 mg/dL (ref 0–99)
Triglycerides: 72 mg/dL (ref 0–149)
VLDL Cholesterol Cal: 14 mg/dL (ref 5–40)

## 2019-05-13 LAB — CBC

## 2019-05-13 LAB — TSH: TSH: 15 u[IU]/mL — ABNORMAL HIGH (ref 0.450–4.500)

## 2019-05-16 ENCOUNTER — Telehealth: Payer: Self-pay

## 2019-05-16 NOTE — Telephone Encounter (Signed)
Spoke with pt. Informing him of the telephone note left by Dr. Linwood Dibbles regarding trying to be dismissed from jury duty. Pt understood. [name], CMA

## 2019-05-17 ENCOUNTER — Telehealth: Payer: Self-pay | Admitting: *Deleted

## 2019-05-17 NOTE — Telephone Encounter (Signed)
Office visit first with me or APP to discuss colonoscopy versus Cologuard.  A colonoscopy cannot be done for this patient in the [facility].

## 2019-05-17 NOTE — Telephone Encounter (Signed)
Left voicemail for patient stating he needs to call and schedule an  office visit with Dr Myrtie Neither or PA/NP before proceeding with colonoscopy due to patient's paraplegia.

## 2019-05-17 NOTE — Telephone Encounter (Signed)
This is a direct screening,  Pt is paraplegic, Do you need to see him in the office prior to colonoscopy?

## 2019-05-18 ENCOUNTER — Telehealth: Payer: Self-pay | Admitting: *Deleted

## 2019-05-18 NOTE — Telephone Encounter (Signed)
Letter sent asking pt to schedule office visit.

## 2019-05-25 ENCOUNTER — Other Ambulatory Visit: Payer: Self-pay

## 2019-05-25 ENCOUNTER — Telehealth (INDEPENDENT_AMBULATORY_CARE_PROVIDER_SITE_OTHER): Payer: Medicare Other | Admitting: Family Medicine

## 2019-05-25 NOTE — Progress Notes (Signed)
Patient had to cancel just before appointment due to attending a funeral. Will call back to reschedule. No charge for visit.  Ellwood Dense, DO PGY-3, [facility] 05/25/2019 11:20 AM

## 2019-05-26 ENCOUNTER — Encounter: Payer: Self-pay | Admitting: *Deleted

## 2019-05-26 NOTE — Telephone Encounter (Signed)
error 

## 2019-05-31 ENCOUNTER — Telehealth: Payer: Self-pay

## 2019-05-31 NOTE — Telephone Encounter (Signed)
Patient calls nurse line requesting new rx for I&O catheter supplies. Patient is currently using [employer].   Called and spoke with representative, they had the wrong fax number on file for sending request for updated order. Provided correct fax number and company is to begin processing the paperwork to send over.   Pt called and informed.   Veronda Prude, RN

## 2019-06-08 ENCOUNTER — Ambulatory Visit: Payer: Medicare Other | Admitting: Gastroenterology

## 2019-06-09 ENCOUNTER — Encounter: Payer: Medicare Other | Admitting: Gastroenterology

## 2019-06-09 NOTE — Telephone Encounter (Signed)
I attempted to contact patient to inform and to see if he could provide me with a good contact number for supplier, however no answer. I called the supplier and left a detailed message on representative VM.

## 2019-06-09 NOTE — Telephone Encounter (Signed)
I haven't received anything for this. Thanks!

## 2019-06-09 NOTE — Telephone Encounter (Signed)
Patient calling to check the status of paperwork for medical supplies. I did not see this in your box, do you recall receiving paperwork? I will contact them and have them resend paperwork if not.

## 2019-06-16 NOTE — Telephone Encounter (Signed)
Patient calls back. I advised patient we never received the order forms. Patient is to call the supplier and give them our fax number again and have the attention sent to me, so I can confirm the order was received, and help the process.

## 2019-06-28 ENCOUNTER — Encounter: Payer: Self-pay | Admitting: Gastroenterology

## 2019-06-28 ENCOUNTER — Ambulatory Visit (INDEPENDENT_AMBULATORY_CARE_PROVIDER_SITE_OTHER): Payer: Medicare Other | Admitting: Gastroenterology

## 2019-06-28 VITALS — BP 110/74 | HR 93 | Temp 98.0°F | Ht 79.0 in | Wt 290.0 lb

## 2019-06-28 DIAGNOSIS — Z1211 Encounter for screening for malignant neoplasm of colon: Secondary | ICD-10-CM | POA: Diagnosis not present

## 2019-06-28 DIAGNOSIS — Z01818 Encounter for other preprocedural examination: Secondary | ICD-10-CM

## 2019-06-28 MED ORDER — NA SULFATE-K SULFATE-MG SULF 17.5-3.13-1.6 GM/177ML PO SOLN: 1.0000 | Freq: Once | ORAL | 0 refills | Status: AC

## 2019-06-28 NOTE — Addendum Note (Signed)
Addended by: Mariane Duval on: 06/28/2019 01:11 PM   Modules accepted: Orders

## 2019-06-28 NOTE — Progress Notes (Signed)
Reviewed and agree with management plans. ? ?Ena Demary L. Khyler Urda, MD, MPH  ?
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
## 2019-06-28 NOTE — Patient Instructions (Signed)
If you are age 50 or older, your body mass index should be between 23-30. Your There is no height or weight on file to calculate BMI. If this is out of the aforementioned range listed, please consider follow up with your Primary Care Provider.  If you are age 64 or younger, your body mass index should be between 19-25. Your There is no height or weight on file to calculate BMI. If this is out of the aformentioned range listed, please consider follow up with your Primary Care Provider.   You have been scheduled for a colonoscopy. Please follow written instructions given to you at your visit today.  Please pick up your prep supplies at the pharmacy within the next 1-3 days. If you use inhalers (even only as needed), please bring them with you on the day of your procedure.   

## 2019-07-08 ENCOUNTER — Telehealth: Payer: Self-pay | Admitting: Gastroenterology

## 2019-07-08 ENCOUNTER — Ambulatory Visit (INDEPENDENT_AMBULATORY_CARE_PROVIDER_SITE_OTHER): Payer: Medicare Other

## 2019-07-08 ENCOUNTER — Other Ambulatory Visit: Payer: Self-pay | Admitting: Gastroenterology

## 2019-07-08 DIAGNOSIS — Z1159 Encounter for screening for other viral diseases: Secondary | ICD-10-CM

## 2019-07-08 NOTE — Telephone Encounter (Signed)
Left message for patient to call the office

## 2019-07-09 LAB — SARS CORONAVIRUS 2 (TAT 6-24 HRS): SARS Coronavirus 2: NEGATIVE

## 2019-07-12 ENCOUNTER — Ambulatory Visit (AMBULATORY_SURGERY_CENTER): Payer: Medicare Other | Admitting: Gastroenterology

## 2019-07-12 ENCOUNTER — Other Ambulatory Visit: Payer: Self-pay

## 2019-07-12 ENCOUNTER — Encounter: Payer: Self-pay | Admitting: Gastroenterology

## 2019-07-12 VITALS — BP 130/72 | HR 66 | Temp 97.5°F | Resp 15 | Ht 79.0 in | Wt 290.0 lb

## 2019-07-12 DIAGNOSIS — Z1211 Encounter for screening for malignant neoplasm of colon: Secondary | ICD-10-CM | POA: Diagnosis not present

## 2019-07-12 MED ORDER — SODIUM CHLORIDE 0.9 % IV SOLN: 500.0000 mL | Freq: Once | INTRAVENOUS | Status: DC

## 2019-07-12 NOTE — Progress Notes (Signed)
Pt's states no medical or surgical changes since previsit or office visit. 

## 2019-07-12 NOTE — Progress Notes (Signed)
Patient is able to transfer to wheelchair without help.  States her does prefer to dress himself.

## 2019-07-12 NOTE — Op Note (Signed)
[facility] Patient Name: [name] Procedure Date: 07/12/2019 8:34 AM MRN: [id number] Endoscopist: Tressia Danas MD, MD Age: 49 Referring MD:  Date of Birth: 16-Apr-1970 Gender: Male Account #: [id number] Procedure:                Colonoscopy Indications:              Screening for colorectal malignant neoplasm, This                            is the patient's first colonoscopy                           No known family history of colon cancer or polyps Medicines:                Monitored Anesthesia Care Procedure:                Pre-Anesthesia Assessment:                           - Prior to the procedure, a History and Physical                            was performed, and patient medications and                            allergies were reviewed. The patient's tolerance of                            previous anesthesia was also reviewed. The risks                            and benefits of the procedure and the sedation                            options and risks were discussed with the patient.                            All questions were answered, and informed consent                            was obtained. Prior Anticoagulants: The patient has                            taken no previous anticoagulant or antiplatelet                            agents. ASA Grade Assessment: II - A patient with                            mild systemic disease. After reviewing the risks                            and benefits, the patient was deemed in  satisfactory condition to undergo the procedure.                           After obtaining informed consent, the colonoscope                            was passed under direct vision. Throughout the                            procedure, the patient's blood pressure, pulse, and                            oxygen saturations were monitored continuously. The                            Colonoscope was introduced  through the anus and                            advanced to the the cecum, identified by                            appendiceal orifice and ileocecal valve. A second                            forward view of the right colon was performed. The                            colonoscopy was performed with moderate difficulty                            due to a redundant colon and significant looping.                            Successful completion of the procedure was aided by                            applying abdominal pressure. The patient tolerated                            the procedure well. The quality of the bowel                            preparation was good. The ileocecal valve,                            appendiceal orifice, and rectum were photographed. Scope In: 8:43:04 AM Scope Out: 8:56:12 AM Scope Withdrawal Time: 0 hours 8 minutes 6 seconds  Total Procedure Duration: 0 hours 13 minutes 8 seconds  Findings:                 The perianal and digital rectal examinations were                            normal.  Non-bleeding internal hemorrhoids were found. The                            hemorrhoids were small.                           The exam was otherwise without abnormality on                            direct and retroflexion views. Complications:            No immediate complications. Estimated Blood Loss:     Estimated blood loss: none. Impression:               - Non-bleeding internal hemorrhoids.                           - The examination was otherwise normal on direct                            and retroflexion views.                           - No specimens collected. Recommendation:           - Discharge patient to home.                           - Resume previous diet.                           - Continue present medications.                           - Repeat colonoscopy in 10 years for screening                            purposes.                            - Emerging evidence supports eating a diet of                            fruits, vegetables, grains, calcium, and yogurt                            while reducing red meat and alcohol may reduce the                            risk of colon cancer.                           - Thank you for allowing me to be involved in your                            colon cancer prevention. Thornton Park MD, MD 07/12/2019 9:00:18 AM This report has been signed electronically.

## 2019-07-12 NOTE — Patient Instructions (Signed)
Try to increase the fiber in your diet, and drink plenty of water.  YOU HAD AN ENDOSCOPIC PROCEDURE TODAY AT THE [facility]:   Refer to the procedure report that was given to you for any specific questions about what was found during the examination.  If the procedure report does not answer your questions, please call your gastroenterologist to clarify.  If you requested that your care partner not be given the details of your procedure findings, then the procedure report has been included in a sealed envelope for you to review at your convenience later.  YOU SHOULD EXPECT: Some feelings of bloating in the abdomen. Passage of more gas than usual.  Walking can help get rid of the air that was put into your GI tract during the procedure and reduce the bloating. If you had a lower endoscopy (such as a colonoscopy or flexible sigmoidoscopy) you may notice spotting of blood in your stool or on the toilet paper. If you underwent a bowel prep for your procedure, you may not have a normal bowel movement for a few days.  Please Note:  You might notice some irritation and congestion in your nose or some drainage.  This is from the oxygen used during your procedure.  There is no need for concern and it should clear up in a day or so.  SYMPTOMS TO REPORT IMMEDIATELY:   Following lower endoscopy (colonoscopy or flexible sigmoidoscopy):  Excessive amounts of blood in the stool  Significant tenderness or worsening of abdominal pains  Swelling of the abdomen that is new, acute  Fever of 100F or higher   For urgent or emergent issues, a gastroenterologist can be reached at any hour by calling [phone]. Do not use MyChart messaging for urgent concerns.    DIET:  We do recommend a small meal at first, but then you may proceed to your regular diet.  Drink plenty of fluids but you should avoid alcoholic beverages for 24 hours. Try to increase the fiber in your diet, and drink plenty of  water. ACTIVITY:  You should plan to take it easy for the rest of today and you should NOT DRIVE or use heavy machinery until tomorrow (because of the sedation medicines used during the test).    FOLLOW UP: Our staff will call the number listed on your records 48-72 hours following your procedure to check on you and address any questions or concerns that you may have regarding the information given to you following your procedure. If we do not reach you, we will leave a message.  We will attempt to reach you two times.  During this call, we will ask if you have developed any symptoms of COVID 19. If you develop any symptoms (ie: fever, flu-like symptoms, shortness of breath, cough etc.) before then, please call [phone].  If you test positive for Covid 19 in the 2 weeks post procedure, please call and report this information to [place].    If any biopsies were taken you will be contacted by phone or by letter within the next 1-3 weeks.  Please call us at [phone] if you have not heard about the biopsies in 3 weeks.    SIGNATURES/CONFIDENTIALITY: You and/or your care partner have signed paperwork which will be entered into your electronic medical record.  These signatures attest to the fact that that the information above on your After Visit Summary has been reviewed and is understood.  Full responsibility of the confidentiality of this  discharge information lies with you and/or your care-partner. 

## 2019-07-12 NOTE — Progress Notes (Signed)
A/ox3, pleased with MAC, report to RN 

## 2019-07-13 ENCOUNTER — Telehealth: Payer: Self-pay | Admitting: Family Medicine

## 2019-07-13 NOTE — Telephone Encounter (Signed)
[employer] and spoke to representative who stated forms were faxed this morning. Will keep an eye out for this.  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

## 2019-07-13 NOTE — Telephone Encounter (Signed)
Will forward to MD. Ireanna Finlayson,CMA  

## 2019-07-13 NOTE — Telephone Encounter (Signed)
Patient calling regarding a form that was completed for [facility] by Dr. Linwood Dibbles, but he says the DX or SX portion of the form was not completed. Patient states he has been out of his medical supplies and dealing with this for 2 months now, but I do not see any documentation that he has called previously. Patient says Dr. Linwood Dibbles or her advisor would have to call [phone] and get this straightened out.   Please call patient with any questions or concerns.

## 2019-07-14 ENCOUNTER — Telehealth: Payer: Self-pay | Admitting: *Deleted

## 2019-07-14 NOTE — Progress Notes (Addendum)
For urinary retention (ICD R33.9), patient uses 6 straight in-and-out caths per day and 1 condom cath at night with leg bag for urinary incontinence (ICD R39.81). 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  Ellwood Dense, DO PGY-3, [facility]

## 2019-07-14 NOTE — Telephone Encounter (Signed)
  Follow up Call-  Call back number 07/12/2019  Post procedure Call Back phone  # [phone]  Permission to leave phone message Yes  Some recent data might be hidden     Patient questions:  Do you have a fever, pain , or abdominal swelling? No. Pain Score  0 *  Have you tolerated food without any problems? Yes.    Have you been able to return to your normal activities? Yes.    Do you have any questions about your discharge instructions: Diet   No. Medications  No. Follow up visit  No.  Do you have questions or concerns about your Care? No.  Actions: * If pain score is 4 or above: 1. No action needed, pain <4.Have you developed a fever since your procedure? no  2.   Have you had an respiratory symptoms (SOB or cough) since your procedure? no  3.   Have you tested positive for COVID 19 since your procedure no  4.   Have you had any family members/close contacts diagnosed with the COVID 19 since your procedure?  no   If yes to any of these questions please route to Laverna [name], RN and Charlett Lango, RN

## 2019-07-28 ENCOUNTER — Other Ambulatory Visit: Payer: Self-pay | Admitting: Family Medicine

## 2019-08-12 DIAGNOSIS — Z125 Encounter for screening for malignant neoplasm of prostate: Secondary | ICD-10-CM | POA: Diagnosis not present

## 2019-08-12 DIAGNOSIS — Z008 Encounter for other general examination: Secondary | ICD-10-CM | POA: Diagnosis not present

## 2019-08-12 DIAGNOSIS — R32 Unspecified urinary incontinence: Secondary | ICD-10-CM | POA: Diagnosis not present

## 2019-08-12 DIAGNOSIS — Z0001 Encounter for general adult medical examination with abnormal findings: Secondary | ICD-10-CM | POA: Diagnosis not present

## 2019-08-12 DIAGNOSIS — G8220 Paraplegia, unspecified: Secondary | ICD-10-CM | POA: Diagnosis not present

## 2019-08-12 DIAGNOSIS — Z7189 Other specified counseling: Secondary | ICD-10-CM | POA: Diagnosis not present

## 2019-08-12 DIAGNOSIS — D649 Anemia, unspecified: Secondary | ICD-10-CM | POA: Diagnosis not present

## 2019-08-12 DIAGNOSIS — E039 Hypothyroidism, unspecified: Secondary | ICD-10-CM | POA: Diagnosis not present

## 2019-08-22 ENCOUNTER — Other Ambulatory Visit: Payer: Self-pay

## 2019-08-22 ENCOUNTER — Ambulatory Visit: Payer: Medicare Other | Attending: General Practice | Admitting: Physical Therapy

## 2019-08-22 DIAGNOSIS — M6281 Muscle weakness (generalized): Secondary | ICD-10-CM | POA: Diagnosis not present

## 2019-08-22 DIAGNOSIS — R293 Abnormal posture: Secondary | ICD-10-CM | POA: Diagnosis present
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
## 2019-08-25 ENCOUNTER — Ambulatory Visit: Payer: Medicare Other | Admitting: Physical Therapy

## 2019-08-25 ENCOUNTER — Other Ambulatory Visit: Payer: Self-pay

## 2019-08-25 ENCOUNTER — Encounter: Payer: Self-pay | Admitting: Physical Therapy

## 2019-08-25 DIAGNOSIS — M6281 Muscle weakness (generalized): Secondary | ICD-10-CM

## 2019-08-25 DIAGNOSIS — R293 Abnormal posture: Secondary | ICD-10-CM
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
## 2019-08-29 ENCOUNTER — Other Ambulatory Visit: Payer: Self-pay

## 2019-08-29 ENCOUNTER — Ambulatory Visit: Payer: Medicare Other

## 2019-08-29 DIAGNOSIS — R293 Abnormal posture: Secondary | ICD-10-CM

## 2019-08-29 DIAGNOSIS — M6281 Muscle weakness (generalized): Secondary | ICD-10-CM
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
## 2019-08-29 NOTE — Patient Instructions (Signed)
Access Code: [email or web address] URL: [email or web address] Date: 08/29/2019 Prepared by: Elmer Bales  Exercises Standing Shoulder Posterior Capsule Stretch - 2 x daily - 7 x weekly - 1 sets - 3 reps - 30 hold Single Arm Doorway Pec Stretch at 90 Degrees Abduction - 2 x daily - 7 x weekly - 1 sets - 3 reps - 30 hold

## 2019-09-01 ENCOUNTER — Ambulatory Visit: Payer: Medicare Other | Attending: General Practice | Admitting: Physical Therapy

## 2019-09-01 DIAGNOSIS — R293 Abnormal posture: Secondary | ICD-10-CM | POA: Insufficient documentation

## 2019-09-01 DIAGNOSIS — M6281 Muscle weakness (generalized): Secondary | ICD-10-CM | POA: Insufficient documentation

## 2019-09-06 ENCOUNTER — Telehealth: Payer: Self-pay

## 2019-09-06 ENCOUNTER — Ambulatory Visit: Payer: Medicare Other

## 2019-09-06 NOTE — Telephone Encounter (Signed)
PT called pt as he had not arrived for scheduled visit. Pt apologized as thought his appointment was at 8:45 not 8 and had been just waiting to leave. Plans to be at visit on Thursday at 8. Elmer Bales, PT, DPT, NCS

## 2019-09-08 ENCOUNTER — Other Ambulatory Visit: Payer: Self-pay

## 2019-09-08 ENCOUNTER — Ambulatory Visit: Payer: Medicare Other

## 2019-09-08 DIAGNOSIS — M6281 Muscle weakness (generalized): Secondary | ICD-10-CM | POA: Diagnosis not present

## 2019-09-08 DIAGNOSIS — R293 Abnormal posture: Secondary | ICD-10-CM
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
## 2019-09-08 NOTE — Patient Instructions (Signed)
Access Code: [email or web address] URL: [email or web address] Date: 09/08/2019 Prepared by: Elmer Bales  Exercises Standing Shoulder Posterior Capsule Stretch - 2 x daily - 7 x weekly - 1 sets - 3 reps - 30 hold Single Arm Doorway Pec Stretch at 90 Degrees Abduction - 2 x daily - 7 x weekly - 1 sets - 3 reps - 30 hold Seated Scapular Retraction with Resistance - 1 x daily - 5 x weekly - 2 sets - 10 reps Seated Single Shoulder External Rotation with Resistance - 1 x daily - 5 x weekly - 2 sets - 10 reps Seated Shoulder Internal Rotation with Anchored Resistance - 1 x daily - 5 x weekly - 2 sets - 10 reps Standing Serratus Punch with Resistance - 1 x daily - 5 x weekly - 2 sets - 10 reps Seated Single Arm Overhead Elbow Extension - 1 x daily - 5 x weekly - 2 sets - 10 reps

## 2019-09-13 ENCOUNTER — Ambulatory Visit: Payer: Medicare Other | Admitting: Physical Therapy

## 2019-09-13 ENCOUNTER — Other Ambulatory Visit: Payer: Self-pay

## 2019-09-13 DIAGNOSIS — M6281 Muscle weakness (generalized): Secondary | ICD-10-CM

## 2019-09-13 DIAGNOSIS — R293 Abnormal posture: Secondary | ICD-10-CM | POA: Diagnosis not present
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
## 2019-09-15 ENCOUNTER — Ambulatory Visit: Payer: Medicare Other

## 2019-09-20 ENCOUNTER — Other Ambulatory Visit: Payer: Self-pay

## 2019-09-20 ENCOUNTER — Ambulatory Visit: Payer: Medicare Other | Admitting: Physical Therapy

## 2019-09-20 DIAGNOSIS — M6281 Muscle weakness (generalized): Secondary | ICD-10-CM | POA: Diagnosis not present

## 2019-09-20 DIAGNOSIS — R293 Abnormal posture: Secondary | ICD-10-CM
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
## 2019-09-22 ENCOUNTER — Ambulatory Visit: Payer: Medicare Other | Admitting: Physical Therapy

## 2019-09-22 ENCOUNTER — Other Ambulatory Visit: Payer: Self-pay

## 2019-09-22 ENCOUNTER — Encounter: Payer: Self-pay | Admitting: Physical Therapy

## 2019-09-22 DIAGNOSIS — R293 Abnormal posture: Secondary | ICD-10-CM

## 2019-09-22 DIAGNOSIS — M6281 Muscle weakness (generalized): Secondary | ICD-10-CM | POA: Diagnosis not present
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
## 2019-09-24 DIAGNOSIS — Z23 Encounter for immunization: Secondary | ICD-10-CM | POA: Diagnosis not present

## 2019-09-27 ENCOUNTER — Ambulatory Visit: Payer: Medicare Other | Admitting: Physical Therapy

## 2019-09-30 ENCOUNTER — Ambulatory Visit: Payer: Medicare Other | Admitting: Physical Therapy

## 2019-09-30 ENCOUNTER — Telehealth: Payer: Self-pay | Admitting: Physical Therapy

## 2019-09-30 NOTE — Telephone Encounter (Signed)
Called patient x 2 today, as patient has no-showed both visits this week.  Left message for patient reminding pt of our no-show policy and that he has multiple appts left in our system (agreed upon at his renewal last week).  Left message for patient to contact office about continuing PT or discharge.    Notified PTA (who is to see pt next week) if he no-shows one additional time, to have front office cancel remaining appoitnments.  Lonia Blood, PT 09/30/19 3:14 PM Phone: [phone] Fax: [phone]

## 2019-10-05 ENCOUNTER — Ambulatory Visit: Payer: Medicare Other | Attending: General Practice | Admitting: Physical Therapy

## 2019-10-07 ENCOUNTER — Ambulatory Visit: Payer: Medicare Other | Admitting: Physical Therapy

## 2019-10-10 ENCOUNTER — Ambulatory Visit: Payer: Medicare Other | Admitting: Physical Therapy

## 2019-10-13 ENCOUNTER — Ambulatory Visit: Payer: Medicare Other | Admitting: Physical Therapy

## 2019-10-15 DIAGNOSIS — Z23 Encounter for immunization: Secondary | ICD-10-CM | POA: Diagnosis not present

## 2019-10-17 ENCOUNTER — Ambulatory Visit: Payer: Medicare Other | Admitting: Physical Therapy

## 2019-10-20 ENCOUNTER — Ambulatory Visit: Payer: Medicare Other | Admitting: Physical Therapy

## 2019-12-05 DIAGNOSIS — S31109A Unspecified open wound of abdominal wall, unspecified quadrant without penetration into peritoneal cavity, initial encounter: Secondary | ICD-10-CM | POA: Diagnosis not present

## 2019-12-06 ENCOUNTER — Other Ambulatory Visit: Payer: Self-pay

## 2019-12-06 ENCOUNTER — Encounter (HOSPITAL_BASED_OUTPATIENT_CLINIC_OR_DEPARTMENT_OTHER): Payer: Medicare Other | Attending: Internal Medicine | Admitting: Internal Medicine

## 2019-12-06 DIAGNOSIS — G8221 Paraplegia, complete: Secondary | ICD-10-CM | POA: Diagnosis not present

## 2019-12-06 DIAGNOSIS — L98493 Non-pressure chronic ulcer of skin of other sites with necrosis of muscle: Secondary | ICD-10-CM | POA: Diagnosis not present

## 2019-12-06 DIAGNOSIS — L89224 Pressure ulcer of left hip, stage 4: Secondary | ICD-10-CM | POA: Diagnosis not present

## 2019-12-06 DIAGNOSIS — L98423 Non-pressure chronic ulcer of back with necrosis of muscle: Secondary | ICD-10-CM | POA: Diagnosis not present

## 2019-12-06 DIAGNOSIS — L03319 Cellulitis of trunk, unspecified: Secondary | ICD-10-CM | POA: Insufficient documentation
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
## 2019-12-06 NOTE — Progress Notes (Signed)
[name], [name] ([id number]) Visit Report for 12/06/2019 Allergy List Details Patient Name: Date of Service: [name], [name] 12/06/2019 7:30 A M Medical Record Number: [id number] Patient Account Number: [id number] Date of Birth/Sex: Treating RN: [birth year] (49 y.o. [name] Primary Care Brock Larmon: Damita Dunnings ND, A LA NA Other Clinician: Referring Maygen Sirico: Treating Tauren Delbuono/Extender: Berenda Morale ND, A LA NA Weeks in Treatment: 0 Allergies Active Allergies No Known Allergies Allergy Notes Electronic Signature(s) Signed: 12/06/2019 5:49:38 PM By: Zenaida Deed RN, BSN Entered By: Zenaida Deed on 12/06/2019 07:53:19 -------------------------------------------------------------------------------- Arrival Information Details Patient Name: Date of Service: [name], [name] 12/06/2019 7:30 A M Medical Record Number: [id number] Patient Account Number: [id number] Date of Birth/Sex: Treating RN: 06/12/1970 (49 y.o. [name] Primary Care Chistine Dematteo: Damita Dunnings ND, A LA NA Other Clinician: Referring Trenten Watchman: Treating Hong Moring/Extender: Berenda Morale ND, A LA NA Weeks in Treatment: 0 Visit Information Patient Arrived: Wheel Chair Arrival Time: 07:42 Accompanied By: self Transfer Assistance: None Patient Identification Verified: Yes Secondary Verification Process Completed: Yes Patient Requires Transmission-Based Precautions: No Patient Has Alerts: No History Since Last Visit Has Dressing in Place as Prescribed: Yes Electronic Signature(s) Signed: 12/06/2019 5:49:38 PM By: Zenaida Deed RN, BSN Entered By: Zenaida Deed on 12/06/2019 07:48:28 -------------------------------------------------------------------------------- Clinic Level of Care Assessment Details Patient Name: Date of Service: [name], [name] 12/06/2019 7:30 A M Medical Record Number: [id number] Patient Account Number: [id number] Date of Birth/Sex: Treating RN: May 15, 1970 (49 y.o. [name])  Yevonne [name] Primary Care Keondrick Dilks: Damita Dunnings ND, A LA NA Other Clinician: Referring Sharyl Panchal: Treating Darline Faith/Extender: Berenda Morale ND, A LA NA Weeks in Treatment: 0 Clinic Level of Care Assessment Items TOOL 1 Quantity Score X- 1 0 Use when EandM and Procedure is performed on INITIAL visit ASSESSMENTS - Nursing Assessment / Reassessment X- 1 20 General Physical Exam (combine w/ comprehensive assessment (listed just below) when performed on new pt. evals) X- 1 25 Comprehensive Assessment (HX, ROS, Risk Assessments, Wounds Hx, etc.) ASSESSMENTS - Wound and Skin Assessment / Reassessment []  - 0 Dermatologic / Skin Assessment (not related to wound area) ASSESSMENTS - Ostomy and/or Continence Assessment and Care []  - 0 Incontinence Assessment and Management []  - 0 Ostomy Care Assessment and Management (repouching, etc.) PROCESS - Coordination of Care X - Simple Patient / Family Education for ongoing care 1 15 []  - 0 Complex (extensive) Patient / Family Education for ongoing care X- 1 10 Staff obtains , Records, T Results / Process Orders est []  - 0 Staff telephones HHA, Nursing Homes / Clarify orders / etc []  - 0 Routine Transfer to another Facility (non-emergent condition) []  - 0 Routine Hospital Admission (non-emergent condition) X- 1 15 New Admissions / / Ordering NPWT Apligraf, etc. , []  - 0 Emergency Hospital Admission (emergent condition) PROCESS - Special Needs []  - 0 Pediatric / Minor Patient Management []  - 0 Isolation Patient Management []  - 0 Hearing / Language / Visual special needs []  - 0 Assessment of Community assistance (transportation, D/C planning, etc.) []  - 0 Additional assistance / Altered mentation []  - 0 Support Surface(s) Assessment (bed, cushion, seat, etc.) INTERVENTIONS - Miscellaneous []  - 0 External ear exam []  - 0 Patient Transfer (multiple staff / / Similar devices) []  -  0 Simple Staple / Suture removal (25 or less) []  - 0 Complex Staple / Suture removal (26 or more) []  - 0 Hypo/Hyperglycemic Management (do not check if billed  separately) X- 1 15 Ankle / Brachial Index (ABI) - do not check if billed separately Has the patient been seen at the hospital within the last three years: Yes Total Score: 100 Level Of Care: New/Established - Level 3 Electronic Signature(s) Signed: 12/06/2019 5:35:40 PM By: [name] RN Entered By: Yevonne [name] on 12/06/2019 09:00:21 -------------------------------------------------------------------------------- Encounter Discharge Information Details Patient Name: Date of Service: [name], [name] 12/06/2019 7:30 A M Medical Record Number: [id number] Patient Account Number: [id number] Date of Birth/Sex: Treating RN: 10-27-1970 (49 y.o. [name] Primary Care [name]: Damita Dunnings ND, A LA NA Other Clinician: Referring [name]: Treating [name]/Extender: Berenda Morale ND, A LA NA Weeks in Treatment: 0 Encounter Discharge Information Items Post Procedure Vitals Discharge Condition: Stable Temperature (F): 98.1 Ambulatory Status: Wheelchair Pulse (bpm): 78 Discharge Destination: Home Respiratory Rate (breaths/min): 18 Transportation: Other Blood Pressure (mmHg): 120/78 Accompanied By: self Schedule Follow-up Appointment: Yes Clinical Summary of Care: Patient Declined Electronic Signature(s) Signed: 12/06/2019 5:16:50 PM By: [name] Entered By: [name] on 12/06/2019 09:32:16 -------------------------------------------------------------------------------- Lower Extremity Assessment Details Patient Name: Date of Service: [name][name] 12/06/2019 7:30 A M Medical Record Number: [id number] Patient Account Number: [id number] Date of Birth/Sex: Treating RN: March 18, 1970 (49 y.o. [name] Primary Care [name]: Damita Dunnings ND, A LA NA Other Clinician: Referring  [name]: Treating [name]/Extender: Berenda Morale ND, A LA NA Weeks in Treatment: 0 Electronic Signature(s) Signed: 12/06/2019 5:49:38 PM By: Zenaida Deed RN, BSN Entered By: Zenaida Deed on 12/06/2019 08:04:26 -------------------------------------------------------------------------------- Multi Wound Chart Details Patient Name: Date of Service: [name][name] 12/06/2019 7:30 A M Medical Record Number: [id number] Patient Account Number: [id number] Date of Birth/Sex: Treating RN: June 13, 1970 (49 y.o. [name]) Yevonne [name] Primary Care Ingram Onnen: Damita Dunnings ND, A LA NA Other Clinician: Referring Lorrayne Ismael: Treating Jurnee Nakayama/Extender: Berenda Morale ND, A LA NA Weeks in Treatment: 0 Vital Signs Height(in): 79 Pulse(bpm): 78 Weight(lbs): 325 Blood Pressure(mmHg): 120/78 Body Mass Index(BMI): 37 Temperature(F): 98.1 Respiratory Rate(breaths/min): 18 Photos: [3:No Photos [name] Flank] [4:No Photos Left Trochanter] [N/A:N/A N/A] Wound Location: [3:Trauma] [4:Pressure Injury] [N/A:N/A] Wounding Event: [3:Abrasion] [4:Pressure Ulcer] [N/A:N/A] Primary Etiology: [3:Paraplegia] [4:Paraplegia] [N/A:N/A] Comorbid History: [3:11/20/2019] [4:10/02/2019] [N/A:N/A] Date Acquired: [3:0] [4:0] [N/A:N/A] Weeks of Treatment: [3:Open] [4:Open] [N/A:N/A] Wound Status: [3:3x7.2x1.8] [4:1.5x0.9x0.1] [N/A:N/A] Measurements L x W x D (cm) [3:16.965] [4:1.06] [N/A:N/A] A (cm) : rea [3:30.536] [4:0.106] [N/A:N/A] Volume (cm) : [3:Full Thickness Without Exposed] [4:Category/Stage IV] [N/A:N/A] Classification: [3:Support Structures Medium] [4:Small] [N/A:N/A] Exudate Amount: [3:Serosanguineous] [4:Serosanguineous] [N/A:N/A] Exudate Type: [3:red, brown] [4:red, brown] [N/A:N/A] Exudate Color: [3:Yes] [4:No] [N/A:N/A] Foul Odor A Cleansing: [3:fter No] [4:N/A] [N/A:N/A] Odor Anticipated Due to Product Use: [3:Distinct, outline attached] [4:Epibole] [N/A:N/A] Wound Margin: [3:Small (1-33%)]  [4:Large (67-100%)] [N/A:N/A] Granulation A mount: [3:Red] [4:Pink, Pale] [N/A:N/A] Granulation Quality: [3:Large (67-100%)] [4:Small (1-33%)] [N/A:N/A] Necrotic Amount: [3:Fat Layer (Subcutaneous Tissue): Yes Fat Layer (Subcutaneous Tissue): Yes N/A] Exposed Structures: [3:Fascia: No Tendon: No Muscle: No Joint: No Bone: No None] [4:Fascia: No Tendon: No Muscle: No Joint: No Bone: No Small (1-33%)] [N/A:N/A] Epithelialization: [3:Debridement - Excisional] [4:Debridement - Excisional] [N/A:N/A] Debridement: Pre-procedure Verification/Time Out 08:43 [4:08:43] [N/A:N/A] Taken: [3:Necrotic/Eschar, Subcutaneous,] [4:Necrotic/Eschar, Subcutaneous,] [N/A:N/A] Tissue Debrided: [3:Slough Skin/Subcutaneous Tissue] [4:Slough Skin/Subcutaneous Tissue] [N/A:N/A] Level: [3:21.6] [4:1.35] [N/A:N/A] Debridement A (sq cm): [3:rea Blade, Forceps] [4:Blade, Forceps] [N/A:N/A] Instrument: [3:Moderate] [4:Moderate] [N/A:N/A] Bleeding: [3:Silver Nitrate] [4:Silver Nitrate] [N/A:N/A] Hemostasis Achieved: [3:0] [4:0] [N/A:N/A] Procedural Pain: [3:0] [4:0] [N/A:N/A] Post Procedural Pain: Debridement Treatment Response: Procedure was  tolerated well [4:Procedure was tolerated well] [N/A:N/A] Post Debridement Measurements L x 3x7.2x1.8 [4:1.5x0.9x0.1] [N/A:N/A] W x D (cm) [3:30.536] [4:0.106] [N/A:N/A] Post Debridement Volume: (cm) [3:N/A] [4:Category/Stage IV] [N/A:N/A] Post Debridement Stage: [3:Debridement] [4:Debridement] [N/A:N/A] Treatment Notes Electronic Signature(s) Signed: 12/06/2019 5:35:40 PM By: [name] RN Signed: 12/06/2019 5:35:49 PM By: Baltazar Najjar MD Entered By: Baltazar Najjar on 12/06/2019 09:04:17 -------------------------------------------------------------------------------- Multi-Disciplinary Care Plan Details Patient Name: Date of Service: [name], [name] 12/06/2019 7:30 A M Medical Record Number: [id number] Patient Account Number: [id number] Date of Birth/Sex: Treating  RN: 21-Sep-1970 (49 y.o. [name]) Yevonne [name] Primary Care Analeese Andreatta: Damita Dunnings ND, A LA NA Other Clinician: Referring Tamiya Colello: Treating Davinia Riccardi/Extender: Berenda Morale ND, A LA NA Weeks in Treatment: 0 Active Inactive Wound/Skin Impairment Nursing Diagnoses: Knowledge deficit related to ulceration/compromised skin integrity Goals: Patient/caregiver will verbalize understanding of skin care regimen Date Initiated: 12/06/2019 Target Resolution Date: 01/06/2020 Goal Status: Active Ulcer/skin breakdown will have a volume reduction of 30% by week 4 Date Initiated: 12/06/2019 Target Resolution Date: 01/06/2020 Goal Status: Active Interventions: Assess patient/caregiver ability to obtain necessary supplies Assess patient/caregiver ability to perform ulcer/skin care regimen upon admission and as needed Assess ulceration(s) every visit Notes: Electronic Signature(s) Signed: 12/06/2019 5:35:40 PM By: [name] RN Entered By: Yevonne [name] on 12/06/2019 08:47:02 -------------------------------------------------------------------------------- Pain Assessment Details Patient Name: Date of Service: [name][name] 12/06/2019 7:30 A M Medical Record Number: [id number] Patient Account Number: [id number] Date of Birth/Sex: Treating RN: 1971/02/17 (49 y.o. [name] Primary Care Artha Stavros: Damita Dunnings ND, A LA NA Other Clinician: Referring Analilia Geddis: Treating Rjay Revolorio/Extender: Berenda Morale ND, A LA NA Weeks in Treatment: 0 Active Problems Location of Pain Severity and Description of Pain Patient Has Paino No Site Locations Rate the pain. Current Pain Level: 0 Pain Management and Medication Current Pain Management: Electronic Signature(s) Signed: 12/06/2019 5:49:38 PM By: Zenaida Deed RN, BSN Entered By: Zenaida Deed on 12/06/2019 08:25:11 -------------------------------------------------------------------------------- Patient/Caregiver Education Details Patient  Name: Date of Service: [name], [place] Aldine Contes 10/5/2021andnbsp7:30 A M Medical Record Number: [id number] Patient Account Number: [id number] Date of Birth/Gender: Treating RN: 1970/07/06 (49 y.o. [name]) Yevonne [name] Primary Care Physician: Damita Dunnings ND, A LA NA Other Clinician: Referring Physician: Treating Physician/Extender: Berenda Morale ND, A LA NA Weeks in Treatment: 0 Education Assessment Education Provided To: Patient Education Topics Provided Wound/Skin Impairment: Methods: Explain/Verbal Responses: State content correctly Electronic Signature(s) Signed: 12/06/2019 5:35:40 PM By: [name] RN Entered By: Yevonne [name] on 12/06/2019 08:47:12 -------------------------------------------------------------------------------- Wound Assessment Details Patient Name: Date of Service: [name], [name] 12/06/2019 7:30 A M Medical Record Number: [id number] Patient Account Number: [id number] Date of Birth/Sex: Treating RN: 07-10-1970 (49 y.o. [name] Primary Care Haskel Dewalt: Damita Dunnings ND, A LA NA Other Clinician: Referring Brance Dartt: Treating Steffani Dionisio/Extender: Berenda Morale ND, A LA NA Weeks in Treatment: 0 Wound Status Wound Number: 3 Primary Etiology: Abrasion Wound Location: [name] Flank Wound Status: Open Wounding Event: Trauma Comorbid History: Paraplegia Date Acquired: 11/20/2019 Weeks Of Treatment: 0 Clustered Wound: No Wound Measurements Length: (cm) 3 Width: (cm) 7.2 Depth: (cm) 1.8 Area: (cm) 16.965 Volume: (cm) 30.536 % Reduction in Area: % Reduction in Volume: Epithelialization: None Tunneling: No Undermining: No Wound Description Classification: Full Thickness Without Exposed Support Structures Wound Margin: Distinct, outline attached Exudate Amount: Medium Exudate Type: Serosanguineous Exudate Color: red, brown Foul Odor After Cleansing: Yes Due to Product Use: No Slough/Fibrino Yes Wound Bed Granulation Amount: Small (1-33%)  Exposed  Structure Granulation Quality: Red Fascia Exposed: No Necrotic Amount: Large (67-100%) Fat Layer (Subcutaneous Tissue) Exposed: Yes Necrotic Quality: Adherent Slough Tendon Exposed: No Muscle Exposed: No Joint Exposed: No Bone Exposed: No Treatment Notes Wound #3 ([name] Flank) 1. Cleanse With Wound Cleanser 2. Periwound Care Skin Prep 3. Primary Dressing Applied Calcium Alginate Ag 4. Secondary Dressing ABD Pad Dry Gauze 5. Secured With Secretary/administratorTape Electronic Signature(s) Signed: 12/06/2019 5:49:38 PM By: Zenaida DeedBoehlein, Linda RN, BSN Entered By: Zenaida DeedBoehlein, Linda on 12/06/2019 08:22:04 -------------------------------------------------------------------------------- Wound Assessment Details Patient Name: Date of Service: [name], [name] 12/06/2019 7:30 A M Medical Record Number: [id number] Patient Account Number: [id number] Date of Birth/Sex: Treating RN: 10/24/1970 (49 y.o. [name] SchoonerM) Boehlein, Linda Primary Care Azhar Yogi: Damita DunningsLILLA ND, A LA NA Other Clinician: Referring Mica Releford: Treating Quentez Lober/Extender: Berenda Moraleobson, Michael LILLA ND, A LA NA Weeks in Treatment: 0 Wound Status Wound Number: 4 Primary Etiology: Pressure Ulcer Wound Location: Left Trochanter Wound Status: Open Wounding Event: Pressure Injury Comorbid History: Paraplegia Date Acquired: 10/02/2019 Weeks Of Treatment: 0 Clustered Wound: No Wound Measurements Length: (cm) 1.5 Width: (cm) 0.9 Depth: (cm) 0.1 Area: (cm) 1.06 Volume: (cm) 0.106 % Reduction in Area: % Reduction in Volume: Epithelialization: Small (1-33%) Tunneling: No Undermining: No Wound Description Classification: Category/Stage IV Wound Margin: Epibole Exudate Amount: Small Exudate Type: Serosanguineous Exudate Color: red, brown Foul Odor After Cleansing: No Slough/Fibrino No Wound Bed Granulation Amount: Large (67-100%) Exposed Structure Granulation Quality: Pink, Pale Fascia Exposed: No Necrotic Amount: Small (1-33%) Fat Layer (Subcutaneous  Tissue) Exposed: Yes Necrotic Quality: Adherent Slough Tendon Exposed: No Muscle Exposed: No Joint Exposed: No Bone Exposed: No Treatment Notes Wound #4 (Left Trochanter) 1. Cleanse With Wound Cleanser 2. Periwound Care Skin Prep 3. Primary Dressing Applied Calcium Alginate Ag 4. Secondary Dressing ABD Pad Dry Gauze 5. Secured With Secretary/administratorTape Electronic Signature(s) Signed: 12/06/2019 5:49:38 PM By: Zenaida DeedBoehlein, Linda RN, BSN Entered By: Zenaida DeedBoehlein, Linda on 12/06/2019 08:24:23 -------------------------------------------------------------------------------- Vitals Details Patient Name: Date of Service: [name], [name] 12/06/2019 7:30 A M Medical Record Number: [id number] Patient Account Number: [id number] Date of Birth/Sex: Treating RN: 07/12/1970 (49 y.o. [name] SchoonerM) Boehlein, Linda Primary Care Chiyeko Ferre: Damita DunningsLILLA ND, A LA NA Other Clinician: Referring Council Munguia: Treating Delton Stelle/Extender: Berenda Moraleobson, Michael LILLA ND, A LA NA Weeks in Treatment: 0 Vital Signs Time Taken: 07:49 Temperature (F): 98.1 Height (in): 79 Pulse (bpm): 78 Source: Stated Respiratory Rate (breaths/min): 18 Weight (lbs): 325 Blood Pressure (mmHg): 120/78 Source: Stated Reference Range: 80 - 120 mg / dl Body Mass Index (BMI): 36.6 Electronic Signature(s) Signed: 12/06/2019 5:49:38 PM By: Zenaida DeedBoehlein, Linda RN, BSN Entered By: Zenaida DeedBoehlein, Linda on 12/06/2019 07:52:09

## 2019-12-06 NOTE — Progress Notes (Addendum)
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 Medical History: Negative for: Type I Diabetes; Type II Diabetes Genitourinary Complaints and Symptoms: Negative for: Frequent urination Medical History: Negative for: End Stage Renal Disease Past Medical History Notes: in and out cath, neurogenic bladder Integumentary (Skin) Complaints and Symptoms: Positive for: Wounds - right flank Medical History: Negative for: History of Burn Musculoskeletal Complaints and Symptoms: Negative for: Muscle Pain; Muscle Weakness Medical History: Negative for: Gout; Rheumatoid Arthritis; Osteoarthritis; Osteomyelitis Neurologic Complaints and Symptoms: Negative for: Numbness/parasthesias Medical History: Positive for: Paraplegia - T5 spinal cord injury Psychiatric Complaints and Symptoms: Negative for: Claustrophobia; Suicidal Medical History: Negative for: Anorexia/bulimia;  Confinement Anxiety Hematologic/Lymphatic Immunological Oncologic Medical History: Negative for: Received Chemotherapy; Received Radiation Immunizations Pneumococcal Vaccine: Received Pneumococcal Vaccination: No Implantable Devices No devices added Hospitalization / Surgery History Type of Hospitalization/Surgery muscle flap Family and Social History Cancer: No; Diabetes: No; Heart Disease: No; Hereditary Spherocytosis: No; Hypertension: No; Kidney Disease: No; Lung Disease: No; Seizures: No; Stroke: No; Thyroid Problems: No; Tuberculosis: No; Never smoker; Marital Status - Widowed; Alcohol Use: Rarely; Drug Use: No History; Caffeine Use: Moderate - coffee; Financial Concerns: No; Food, Clothing or Shelter Needs: No; Support System Lacking: No; Transportation Concerns: No Electronic Signature(s) Signed: 12/06/2019 5:35:49 PM By: Baltazar Najjarobson, Josealfredo Adkins MD Signed: 12/06/2019 5:49:38 PM By: Zenaida DeedBoehlein, Linda RN, BSN Entered By: Zenaida DeedBoehlein, Linda on 12/06/2019 08:00:17 -------------------------------------------------------------------------------- SuperBill Details Patient Name: Date of Service: DA [name] KittenV [place], [place] [name] 12/06/2019 Medical Record Number: [id number] Patient Account Number: [id number] Date of Birth/Sex: Treating RN: 02/09/1971 (49 y.o. 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 L03.319 Cellulitis of trunk, unspecified L89.224 Pressure ulcer of left hip, stage 4 G82.21 Paraplegia, complete Facility Procedures CPT4 Code: [id number] 9 Description: 9213 - WOUND CARE VISIT-LEV 3 EST PT Modifier: 25 Quantity: 1 CPT4 Code: [id number] 1 Description: 1042 - DEB SUBQ TISSUE 20 SQ CM/< ICD-10 Diagnosis Description L98.423 Non-pressure chronic ulcer of  back with necrosis of muscle L89.224 Pressure ulcer of left hip, stage 4 Modifier: Quantity: 1 CPT4 Code: [id number] 1 Description: 1045 - DEB SUBQ TISS EA ADDL 20CM ICD-10 Diagnosis Description 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, stage 4 Modifier: Quantity: 1 Physician Procedures : CPT4 Code Description Modifier [id number] 99204 - WC PHYS LEVEL 4 - NEW PT 25 ICD-10 Diagnosis Description 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, stage 4 L03.319 Cellulitis of trunk,  unspecified G82.21 Paraplegia, complete Quantity: 1 : [id number] 11042 - WC PHYS SUBQ TISS 20 SQ CM ICD-10 Diagnosis Description 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

## 2019-12-13 ENCOUNTER — Encounter (HOSPITAL_BASED_OUTPATIENT_CLINIC_OR_DEPARTMENT_OTHER): Payer: Medicare Other | Admitting: Internal Medicine

## 2019-12-13 DIAGNOSIS — G8221 Paraplegia, complete: Secondary | ICD-10-CM | POA: Diagnosis not present

## 2019-12-13 DIAGNOSIS — L89224 Pressure ulcer of left hip, stage 4: Secondary | ICD-10-CM | POA: Diagnosis not present

## 2019-12-13 DIAGNOSIS — L98423 Non-pressure chronic ulcer of back with necrosis of muscle: Secondary | ICD-10-CM | POA: Diagnosis not present

## 2019-12-13 DIAGNOSIS — L98413 Non-pressure chronic ulcer of buttock with necrosis of muscle: Secondary | ICD-10-CM | POA: Diagnosis not present

## 2019-12-13 DIAGNOSIS — L03319 Cellulitis of trunk, unspecified: Secondary | ICD-10-CM | POA: Diagnosis not present
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
## 2019-12-13 NOTE — Progress Notes (Signed)
[name], [name] ([id number]) Visit Report for 12/13/2019 Arrival Information Details Patient Name: Date of Service: DA Seth Bake [name], [name] 12/13/2019 7:30 A M Medical Record Number: [id number] Patient Account Number: [id number] Date of Birth/Sex: Treating RN: 1970-06-25 (49 y.o. [name]) [name] Primary Care Ladd Cen: Evelena Leyden Other Clinician: Referring Verlyn Dannenberg: Treating Brylinn Teaney/Extender: Minda Meo in Treatment: 1 Visit Information History Since Last Visit Added or deleted any medications: No Patient Arrived: Wheel Chair Any new allergies or adverse reactions: No Arrival Time: 07:58 Had a fall or experienced change in No Accompanied By: self activities of daily living that may affect Transfer Assistance: Manual risk of falls: Patient Identification Verified: Yes Signs or symptoms of abuse/neglect since last visito No Secondary Verification Process Completed: Yes Hospitalized since last visit: No Patient Requires Transmission-Based Precautions: No Implantable device outside of the clinic excluding No Patient Has Alerts: No cellular tissue based products placed in the center since last visit: Has Dressing in Place as Prescribed: Yes Pain Present Now: No Electronic Signature(s) Signed: 12/13/2019 9:19:13 AM By: Karl Ito Entered By: Karl Ito on 12/13/2019 07:58:36 -------------------------------------------------------------------------------- Clinic Level of Care Assessment Details Patient Name: Date of Service: [name] 12/13/2019 7:30 A M Medical Record Number: [id number] Patient Account Number: [id number] Date of Birth/Sex: Treating RN: 1970/08/29 (49 y.o. [name]) [name] Primary Care Wasyl Dornfeld: Evelena Leyden Other Clinician: Referring Evie Crumpler: Treating Benjamin Casanas/Extender: Minda Meo in Treatment: 1 Clinic Level of Care Assessment Items TOOL 4 Quantity Score X- 1 0 Use when only an EandM [name]  performed on FOLLOW-UP visit ASSESSMENTS - Nursing Assessment / Reassessment X- 1 10 Reassessment of Co-morbidities (includes updates in patient status) X- 1 5 Reassessment of Adherence to Treatment Plan ASSESSMENTS - Wound and Skin A ssessment / Reassessment []  - 0 Simple Wound Assessment / Reassessment - one wound X- 2 5 Complex Wound Assessment / Reassessment - multiple wounds []  - 0 Dermatologic / Skin Assessment (not related to wound area) ASSESSMENTS - Focused Assessment []  - 0 Circumferential Edema Measurements - multi extremities []  - 0 Nutritional Assessment / Counseling / Intervention []  - 0 Lower Extremity Assessment (monofilament, tuning fork, pulses) []  - 0 Peripheral Arterial Disease Assessment (using hand held doppler) ASSESSMENTS - Ostomy and/or Continence Assessment and Care []  - 0 Incontinence Assessment and Management []  - 0 Ostomy Care Assessment and Management (repouching, etc.) PROCESS - Coordination of Care X - Simple Patient / Family Education for ongoing care 1 15 []  - 0 Complex (extensive) Patient / Family Education for ongoing care X- 1 10 Staff obtains , Records, T Results / Process Orders est []  - 0 Staff telephones HHA, Nursing Homes / Clarify orders / etc []  - 0 Routine Transfer to another Facility (non-emergent condition) []  - 0 Routine Hospital Admission (non-emergent condition) []  - 0 New Admissions / / Ordering NPWT Apligraf, etc. , []  - 0 Emergency Hospital Admission (emergent condition) []  - 0 Simple Discharge Coordination X- 1 15 Complex (extensive) Discharge Coordination PROCESS - Special Needs []  - 0 Pediatric / Minor Patient Management []  - 0 Isolation Patient Management []  - 0 Hearing / Language / Visual special needs []  - 0 Assessment of Community assistance (transportation, D/C planning, etc.) []  - 0 Additional assistance / Altered mentation []  - 0 Support Surface(s) Assessment  (bed, cushion, seat, etc.) INTERVENTIONS - Wound Cleansing / Measurement []  - 0 Simple Wound Cleansing - one wound X- 2 5 Complex Wound Cleansing -  multiple wounds X- 1 5 Wound Imaging (photographs - any number of wounds) []  - 0 Wound Tracing (instead of photographs) []  - 0 Simple Wound Measurement - one wound X- 2 5 Complex Wound Measurement - multiple wounds INTERVENTIONS - Wound Dressings []  - 0 Small Wound Dressing one or multiple wounds X- 2 15 Medium Wound Dressing one or multiple wounds []  - 0 Large Wound Dressing one or multiple wounds []  - 0 Application of Medications - topical []  - 0 Application of Medications - injection INTERVENTIONS - Miscellaneous []  - 0 External ear exam []  - 0 Specimen Collection (cultures, biopsies, blood, body fluids, etc.) []  - 0 Specimen(s) / Culture(s) sent or taken to Lab for analysis []  - 0 Patient Transfer (multiple staff / Nurse, adultHoyer Lift / Similar devices) []  - 0 Simple Staple / Suture removal (25 or less) []  - 0 Complex Staple / Suture removal (26 or more) []  - 0 Hypo / Hyperglycemic Management (close monitor of Blood Glucose) []  - 0 Ankle / Brachial Index (ABI) - do not check if billed separately X- 1 5 Vital Signs Has the patient been seen at the hospital within the last three years: Yes Total Score: 125 Level Of Care: New/Established - Level 4 Electronic Signature(s) Signed: 12/13/2019 5:49:00 PM By: Yevonne PaxEpps, [name] RN Entered By: Yevonne PaxEpps, [name] on 12/13/2019 08:24:12 -------------------------------------------------------------------------------- Encounter Discharge Information Details Patient Name: Date of Service: [name] 12/13/2019 7:30 A M Medical Record Number: [id number] Patient Account Number: [id number] Date of Birth/Sex: Treating RN: 07/17/1970 (49 y.o. [name]) Deaton, Bobbi Primary Care Angelo Caroll: Evelena LeydenLilland, Alana Other Clinician: Referring Dinero Chavira: Treating Shakiara Lukic/Extender: Minda Meoobson, Michael Lilland,  Alana Weeks in Treatment: 1 Encounter Discharge Information Items Discharge Condition: Stable Ambulatory Status: Wheelchair Discharge Destination: Home Transportation: Private Auto Accompanied By: self Schedule Follow-up Appointment: Yes Clinical Summary of Care: Electronic Signature(s) Signed: 12/13/2019 5:27:04 PM By: Shawn Stalleaton, Bobbi Entered By: Shawn Stalleaton, Bobbi on 12/13/2019 08:54:04 -------------------------------------------------------------------------------- Lower Extremity Assessment Details Patient Name: Date of Service: [name] 12/13/2019 7:30 A M Medical Record Number: [id number] Patient Account Number: [id number] Date of Birth/Sex: Treating RN: 07/17/1970 (49 y.o. [name]) Deaton, Bobbi Primary Care Johncharles Fusselman: Evelena LeydenLilland, Alana Other Clinician: Referring Johari Bennetts: Treating Mileidy Atkin/Extender: Minda Meoobson, Michael Lilland, Alana Weeks in Treatment: 1 Electronic Signature(s) Signed: 12/13/2019 5:27:04 PM By: Shawn Stalleaton, Bobbi Entered By: Shawn Stalleaton, Bobbi on 12/13/2019 08:03:17 -------------------------------------------------------------------------------- Multi Wound Chart Details Patient Name: Date of Service: DA Delman KittenV [name], [name] 12/13/2019 7:30 A M Medical Record Number: [id number] Patient Account Number: [id number] Date of Birth/Sex: Treating RN: 05/30/1970 (49 y.o. [name] PetitM) Yevonne PaxEpps, [name] Primary Care Tommaso Cavitt: Evelena LeydenLilland, Alana Other Clinician: Referring Noelle Hoogland: Treating Caysen Whang/Extender: Minda Meoobson, Michael Lilland, Alana Weeks in Treatment: 1 Vital Signs Height(in): 79 Pulse(bpm): 101 Weight(lbs): 325 Blood Pressure(mmHg): 103/66 Body Mass Index(BMI): 37 Temperature(F): 98.1 Respiratory Rate(breaths/min): 18 Photos: [3:No Photos Right Flank] [4:No Photos Left Trochanter] [N/A:N/A N/A] Wound Location: [3:Trauma] [4:Pressure Injury] [N/A:N/A] Wounding Event: [3:Abrasion] [4:Pressure Ulcer] [N/A:N/A] Primary Etiology: [3:Paraplegia] [4:Paraplegia] [N/A:N/A] Comorbid History:  [3:11/20/2019] [4:10/02/2019] [N/A:N/A] Date Acquired: [3:1] [4:1] [N/A:N/A] Weeks of Treatment: [3:Open] [4:Open] [N/A:N/A] Wound Status: [3:3x7.1x2.5] [4:1.2x1.3x0.1] [N/A:N/A] Measurements L x W x D (cm) [3:16.729] [4:1.225] [N/A:N/A] A (cm) : rea [3:41.822] [4:0.123] [N/A:N/A] Volume (cm) : [3:1.40%] [4:-15.60%] [N/A:N/A] % Reduction in A rea: [3:-37.00%] [4:-16.00%] [N/A:N/A] % Reduction in Volume: [3:10] Starting Position 1 (o'clock): [3:2] Ending Position 1 (o'clock): [3:1.1] Maximum Distance 1 (cm): [3:Yes] [4:No] [N/A:N/A] Undermining: [3:Full Thickness Without Exposed] [4:Category/Stage IV] [N/A:N/A] Classification: [3:Support Structures Medium] [4:Small] [  N/A:N/A] Exudate Amount: [3:Serosanguineous] [4:Serosanguineous] [N/A:N/A] Exudate Type: [3:red, brown] [4:red, brown] [N/A:N/A] Exudate Color: [3:Distinct, outline attached] [4:Epibole] [N/A:N/A] Wound Margin: [3:Large (67-100%)] [4:Medium (34-66%)] [N/A:N/A] Granulation Amount: [3:Red] [4:Pink, Pale] [N/A:N/A] Granulation Quality: [3:Small (1-33%)] [4:Medium (34-66%)] [N/A:N/A] Necrotic Amount: [3:Fat Layer (Subcutaneous Tissue): Yes Fat Layer (Subcutaneous Tissue): Yes N/A] Exposed Structures: [3:Fascia: No Tendon: No Muscle: No Joint: No Bone: No Small (1-33%)] [4:Fascia: No Tendon: No Muscle: No Joint: No Bone: No Small (1-33%)] [N/A:N/A] Treatment Notes Electronic Signature(s) Signed: 12/13/2019 5:41:11 PM By: Baltazar Najjar MD Signed: 12/13/2019 5:49:00 PM By: [name] RN Entered By: Baltazar Najjar on 12/13/2019 08:31:58 -------------------------------------------------------------------------------- Multi-Disciplinary Care Plan Details Patient Name: Date of Service: [name] 12/13/2019 7:30 A M Medical Record Number: [id number] Patient Account Number: [id number] Date of Birth/Sex: Treating RN: [birth year] (49 y.o. Melonie [name] Primary Care Jlyn Cerros: Evelena Leyden Other Clinician: Referring  Dayton Kenley: Treating Keniah Klemmer/Extender: Minda Meo in Treatment: 1 Active Inactive Wound/Skin Impairment Nursing Diagnoses: Knowledge deficit related to ulceration/compromised skin integrity Goals: Patient/caregiver will verbalize understanding of skin care regimen Date Initiated: 12/06/2019 Target Resolution Date: 01/06/2020 Goal Status: Active Ulcer/skin breakdown will have a volume reduction of 30% by week 4 Date Initiated: 12/06/2019 Target Resolution Date: 01/06/2020 Goal Status: Active Interventions: Assess patient/caregiver ability to obtain necessary supplies Assess patient/caregiver ability to perform ulcer/skin care regimen upon admission and as needed Assess ulceration(s) every visit Notes: Electronic Signature(s) Signed: 12/13/2019 5:49:00 PM By: [name] RN Entered By: [name] on 12/13/2019 07:54:03 -------------------------------------------------------------------------------- Pain Assessment Details Patient Name: Date of Service: DA Seth Bake [name] 12/13/2019 7:30 A M Medical Record Number: [id number] Patient Account Number: [id number] Date of Birth/Sex: Treating RN: 1970/11/18 (49 y.o. Melonie [name] Primary Care Emrick Hensch: Evelena Leyden Other Clinician: Referring Joshau Code: Treating Sandara Tyree/Extender: Minda Meo in Treatment: 1 Active Problems Location of Pain Severity and Description of Pain Patient Has Paino No Site Locations Pain Management and Medication Current Pain Management: Electronic Signature(s) Signed: 12/13/2019 9:19:13 AM By: Karl Ito Signed: 12/13/2019 5:49:00 PM By: [name] RN Entered By: Karl Ito on 12/13/2019 07:59:51 -------------------------------------------------------------------------------- Patient/Caregiver Education Details Patient Name: Date of Service: [name], [place] Aldine Contes 10/12/2021andnbsp7:30 A M Medical Record Number: [id number] Patient  Account Number: [id number] Date of Birth/Gender: Treating RN: [birth year] (49 y.o. Melonie [name] Primary Care Physician: Evelena Leyden Other Clinician: Referring Physician: Treating Physician/Extender: Minda Meo in Treatment: 1 Education Assessment Education Provided To: Patient Education Topics Provided Wound/Skin Impairment: Methods: Explain/Verbal Responses: State content correctly Electronic Signature(s) Signed: 12/13/2019 5:49:00 PM By: [name] RN Entered By: [name] on 12/13/2019 07:54:18 -------------------------------------------------------------------------------- Wound Assessment Details Patient Name: Date of Service: [name][name] 12/13/2019 7:30 A M Medical Record Number: [id number] Patient Account Number: [id number] Date of Birth/Sex: Treating RN: 1970/07/23 (49 y.o. Melonie [name] Primary Care Shakea Isip: Evelena Leyden Other Clinician: Referring Alexee Delsanto: Treating Nickia Boesen/Extender: Jerre Simon Weeks in Treatment: 1 Wound Status Wound Number: 3 Primary Etiology: Abrasion Wound Location: Right Flank Wound Status: Open Wounding Event: Trauma Comorbid History: Paraplegia Date Acquired: 11/20/2019 Weeks Of Treatment: 1 Clustered Wound: No Photos Photo Uploaded By: Benjaman Kindler on 12/13/2019 13:53:43 Wound Measurements Length: (cm) 3 Width: (cm) 7.1 Depth: (cm) 2.5 Area: (cm) 16.729 Volume: (cm) 41.822 % Reduction in Area: 1.4% % Reduction in Volume: -37% Epithelialization: Small (1-33%) Tunneling: No Undermining: Yes Starting Position (o'clock): 10 Ending Position (o'clock): 2 Maximum Distance: (cm)  1.1 Wound Description Classification: Full Thickness Without Exposed Support Structures Wound Margin: Distinct, outline attached Exudate Amount: Medium Exudate Type: Serosanguineous Exudate Color: red, brown Foul Odor After Cleansing: No Slough/Fibrino Yes Wound Bed Granulation  Amount: Large (67-100%) Exposed Structure Granulation Quality: Red Fascia Exposed: No Necrotic Amount: Small (1-33%) Fat Layer (Subcutaneous Tissue) Exposed: Yes Necrotic Quality: Adherent Slough Tendon Exposed: No Muscle Exposed: No Joint Exposed: No Bone Exposed: No Treatment Notes Wound #3 (Right Flank) 1. Cleanse With Wound Cleanser 2. Periwound Care Skin Prep 3. Primary Dressing Applied Calcium Alginate Ag 4. Secondary Dressing ABD Pad 5. Secured With Medipore tape Notes explained home health works, when they will be out to apply wound vac, and when to return to wound center. patient in agreement. Electronic Signature(s) Signed: 12/13/2019 5:27:04 PM By: Shawn Stall Signed: 12/13/2019 5:49:00 PM By: [name] RN Entered By: Shawn Stall on 12/13/2019 08:03:46 -------------------------------------------------------------------------------- Wound Assessment Details Patient Name: Date of Service: [name] 12/13/2019 7:30 A M Medical Record Number: [id number] Patient Account Number: [id number] Date of Birth/Sex: Treating RN: 1971/01/08 (49 y.o. Melonie [name] Primary Care Sandip Power: Evelena Leyden Other Clinician: Referring Yaquelin Langelier: Treating Geran Haithcock/Extender: Minda Meo in Treatment: 1 Wound Status Wound Number: 4 Primary Etiology: Pressure Ulcer Wound Location: Left Trochanter Wound Status: Open Wounding Event: Pressure Injury Comorbid History: Paraplegia Date Acquired: 10/02/2019 Weeks Of Treatment: 1 Clustered Wound: No Photos Photo Uploaded By: Benjaman Kindler on 12/13/2019 13:53:43 Wound Measurements Length: (cm) 1.2 Width: (cm) 1.3 Depth: (cm) 0.1 Area: (cm) 1.225 Volume: (cm) 0.123 % Reduction in Area: -15.6% % Reduction in Volume: -16% Epithelialization: Small (1-33%) Tunneling: No Undermining: No Wound Description Classification: Category/Stage IV Wound Margin: Epibole Exudate Amount: Small Exudate  Type: Serosanguineous Exudate Color: red, brown Foul Odor After Cleansing: No Slough/Fibrino Yes Wound Bed Granulation Amount: Medium (34-66%) Exposed Structure Granulation Quality: Pink, Pale Fascia Exposed: No Necrotic Amount: Medium (34-66%) Fat Layer (Subcutaneous Tissue) Exposed: Yes Necrotic Quality: Adherent Slough Tendon Exposed: No Muscle Exposed: No Joint Exposed: No Bone Exposed: No Treatment Notes Wound #4 (Left Trochanter) 1. Cleanse With Wound Cleanser 2. Periwound Care Skin Prep 3. Primary Dressing Applied Calcium Alginate Ag 4. Secondary Dressing ABD Pad 5. Secured With Medipore tape Notes explained home health works, when they will be out to apply wound vac, and when to return to wound center. patient in agreement. Electronic Signature(s) Signed: 12/13/2019 5:27:04 PM By: Shawn Stall Signed: 12/13/2019 5:49:00 PM By: [name] RN Entered By: Shawn Stall on 12/13/2019 08:04:00 -------------------------------------------------------------------------------- Vitals Details Patient Name: Date of Service: [name] 12/13/2019 7:30 A M Medical Record Number: [id number] Patient Account Number: [id number] Date of Birth/Sex: Treating RN: 05-05-1970 (49 y.o. [name]) [name] Primary Care Laloni Rowton: Evelena Leyden Other Clinician: Referring Angle Karel: Treating Emaly Boschert/Extender: Minda Meo in Treatment: 1 Vital Signs Time Taken: 07:59 Temperature (F): 98.1 Height (in): 79 Pulse (bpm): 101 Weight (lbs): 325 Respiratory Rate (breaths/min): 18 Body Mass Index (BMI): 36.6 Blood Pressure (mmHg): 103/66 Reference Range: 80 - 120 mg / dl Electronic Signature(s) Signed: 12/13/2019 9:19:13 AM By: Karl Ito Entered By: Karl Ito on 12/13/2019 07:59:44

## 2019-12-20 ENCOUNTER — Encounter (HOSPITAL_BASED_OUTPATIENT_CLINIC_OR_DEPARTMENT_OTHER): Payer: Medicare Other | Admitting: Internal Medicine

## 2019-12-20 ENCOUNTER — Other Ambulatory Visit: Payer: Self-pay

## 2019-12-20 DIAGNOSIS — L98493 Non-pressure chronic ulcer of skin of other sites with necrosis of muscle: Secondary | ICD-10-CM | POA: Diagnosis not present

## 2019-12-20 DIAGNOSIS — L98423 Non-pressure chronic ulcer of back with necrosis of muscle: Secondary | ICD-10-CM | POA: Diagnosis not present

## 2019-12-20 DIAGNOSIS — L89224 Pressure ulcer of left hip, stage 4: Secondary | ICD-10-CM | POA: Diagnosis not present

## 2019-12-20 DIAGNOSIS — G8221 Paraplegia, complete: Secondary | ICD-10-CM | POA: Diagnosis not present

## 2019-12-20 DIAGNOSIS — L03319 Cellulitis of trunk, unspecified: Secondary | ICD-10-CM | POA: Diagnosis not present

## 2019-12-20 NOTE — Progress Notes (Signed)
AYDAN, LEVITZ ([id number]) Visit Report for 12/20/2019 Arrival Information Details Patient Name: Date of Service: [name], [name] 12/20/2019 7:30 A M Medical Record Number: [id number] Patient Account Number: [id number] Date of Birth/Sex: Treating RN: 07/29/1970 (49 y.o. [name] Primary Care [place] Boyett: Evelena Leyden Other Clinician: Referring Jamarian Jacinto: Treating Maudean Hoffmann/Extender: Minda Meo in Treatment: 2 Visit Information History Since Last Visit Added or deleted any medications: No Patient Arrived: Wheel Chair Any new allergies or adverse reactions: No Arrival Time: 08:06 Had a fall or experienced change in No Accompanied By: alone activities of daily living that may affect Transfer Assistance: None risk of falls: Patient Identification Verified: Yes Signs or symptoms of abuse/neglect since last visito No Secondary Verification Process Completed: Yes Hospitalized since last visit: No Patient Requires Transmission-Based Precautions: No Implantable device outside of the clinic excluding No Patient Has Alerts: No cellular tissue based products placed in the center since last visit: Has Dressing in Place as Prescribed: Yes Pain Present Now: No Electronic Signature(s) Signed: 12/20/2019 5:04:42 PM By: Zandra Abts RN, BSN Entered By: Zandra Abts on 12/20/2019 08:07:14 -------------------------------------------------------------------------------- Encounter Discharge Information Details Patient Name: Date of Service: [name] 12/20/2019 7:30 A M Medical Record Number: [id number] Patient Account Number: [id number] Date of Birth/Sex: Treating RN: 03-Feb-1971 (49 y.o. Katherina Right Primary Care Jaishawn Witzke: Evelena Leyden Other Clinician: Referring Kanya Potteiger: Treating Dalyce Renne/Extender: Minda Meo in Treatment: 2 Encounter Discharge Information Items Post Procedure Vitals Discharge Condition:  Stable Temperature (F): 98.1 Ambulatory Status: Wheelchair Pulse (bpm): 98 Discharge Destination: Home Respiratory Rate (breaths/min): 18 Transportation: Other Blood Pressure (mmHg): 129/90 Accompanied By: self Schedule Follow-up Appointment: Yes Clinical Summary of Care: Patient Declined Electronic Signature(s) Signed: 12/20/2019 11:49:38 AM By: Cherylin Mylar Entered By: Cherylin Mylar on 12/20/2019 09:12:35 -------------------------------------------------------------------------------- Multi Wound Chart Details Patient Name: Date of Service: [name], [name] 12/20/2019 7:30 A M Medical Record Number: [id number] Patient Account Number: [id number] Date of Birth/Sex: Treating RN: [birth year] (49 y.o. [name] Primary Care Megann Easterwood: Evelena Leyden Other Clinician: Referring Jonluke Cobbins: Treating Windie Marasco/Extender: Minda Meo in Treatment: 2 Vital Signs Height(in): 79 Pulse(bpm): 98 Weight(lbs): 325 Blood Pressure(mmHg): 129/90 Body Mass Index(BMI): 37 Temperature(F): 98.1 Respiratory Rate(breaths/min): 18 Photos: [3:No Photos Right Flank] [4:No Photos Left Trochanter] [N/A:N/A N/A] Wound Location: [3:Trauma] [4:Pressure Injury] [N/A:N/A] Wounding Event: [3:Abrasion] [4:Pressure Ulcer] [N/A:N/A] Primary Etiology: [3:Paraplegia] [4:Paraplegia] [N/A:N/A] Comorbid History: [3:11/20/2019] [4:10/02/2019] [N/A:N/A] Date Acquired: [3:2] [4:2] [N/A:N/A] Weeks of Treatment: [3:Open] [4:Open] [N/A:N/A] Wound Status: [3:3x6.8x2.4] [4:1x1x0.2] [N/A:N/A] Measurements L x W x D (cm) [3:16.022] [4:0.785] [N/A:N/A] A (cm) : rea [3:38.453] [4:0.157] [N/A:N/A] Volume (cm) : [3:5.60%] [4:25.90%] [N/A:N/A] % Reduction in A rea: [3:-25.90%] [4:-48.10%] [N/A:N/A] % Reduction in Volume: [3:10] Position 1 (o'clock): [3:2] Maximum Distance 1 (cm): [3:2] Position 2 (o'clock): [3:1.7] Maximum Distance 2 (cm): [3:Yes] [4:No] [N/A:N/A] Tunneling: [3:Full  Thickness Without Exposed] [4:Category/Stage IV] [N/A:N/A] Classification: [3:Support Structures Medium] [4:Small] [N/A:N/A] Exudate A mount: [3:Serosanguineous] [4:Serosanguineous] [N/A:N/A] Exudate Type: [3:red, brown] [4:red, brown] [N/A:N/A] Exudate Color: [3:Distinct, outline attached] [4:Epibole] [N/A:N/A] Wound Margin: [3:Medium (34-66%)] [4:Large (67-100%)] [N/A:N/A] Granulation A mount: [3:Pink] [4:Pink] [N/A:N/A] Granulation Quality: [3:Medium (34-66%)] [4:Small (1-33%)] [N/A:N/A] Necrotic A mount: [3:Fat Layer (Subcutaneous Tissue): Yes Fat Layer (Subcutaneous Tissue): Yes N/A] Exposed Structures: [3:Fascia: No Tendon: No Muscle: No Joint: No Bone: No Small (1-33%)] [4:Fascia: No Tendon: No Muscle: No Joint: No Bone: No Small (1-33%)] [N/A:N/A] Epithelialization: [3:Debridement - Excisional] [4:N/A] [N/A:N/A] Debridement:  Pre-procedure Verification/Time Out 08:25 [4:N/A] [N/A:N/A] Taken: [3:Lidocaine 4% Topical Solution] [4:N/A] [N/A:N/A] Pain Control: [3:Subcutaneous, Slough] [4:N/A] [N/A:N/A] Tissue Debrided: [3:Skin/Subcutaneous Tissue] [4:N/A] [N/A:N/A] Level: [3:20.4] [4:N/A] [N/A:N/A] Debridement A (sq cm): [3:rea Curette] [4:N/A] [N/A:N/A] Instrument: [3:Minimum] [4:N/A] [N/A:N/A] Bleeding: [3:Pressure] [4:N/A] [N/A:N/A] Hemostasis A chieved: [3:0] [4:N/A] [N/A:N/A] Procedural Pain: [3:0] [4:N/A] [N/A:N/A] Post Procedural Pain: [3:Procedure was tolerated well] [4:N/A] [N/A:N/A] Debridement Treatment Response: [3:3x6.8x2.4] [4:N/A] [N/A:N/A] Post Debridement Measurements L x W x D (cm) [3:38.453] [4:N/A] [N/A:N/A] Post Debridement Volume: (cm) [3:Debridement] [4:N/A] [N/A:N/A] Treatment Notes Electronic Signature(s) Signed: 12/20/2019 4:22:08 PM By: Baltazar Najjar MD Signed: 12/20/2019 5:04:07 PM By: Shawn Stall Entered By: Baltazar Najjar on 12/20/2019 08:35:33 -------------------------------------------------------------------------------- Multi-Disciplinary  Care Plan Details Patient Name: Date of Service: [name] 12/20/2019 7:30 A M Medical Record Number: [id number] Patient Account Number: [id number] Date of Birth/Sex: Treating RN: 11-Apr-1970 (49 y.o. [name] Primary Care Verdean Murin: Evelena Leyden Other Clinician: Referring Neev Mcmains: Treating Clay Menser/Extender: Minda Meo in Treatment: 2 Active Inactive Wound/Skin Impairment Nursing Diagnoses: Knowledge deficit related to ulceration/compromised skin integrity Goals: Patient/caregiver will verbalize understanding of skin care regimen Date Initiated: 12/06/2019 Target Resolution Date: 01/06/2020 Goal Status: Active Ulcer/skin breakdown will have a volume reduction of 30% by week 4 Date Initiated: 12/06/2019 Target Resolution Date: 01/06/2020 Goal Status: Active Interventions: Assess patient/caregiver ability to obtain necessary supplies Assess patient/caregiver ability to perform ulcer/skin care regimen upon admission and as needed Assess ulceration(s) every visit Notes: Electronic Signature(s) Signed: 12/20/2019 5:04:07 PM By: Shawn Stall Entered By: Shawn Stall on 12/20/2019 08:10:46 -------------------------------------------------------------------------------- Pain Assessment Details Patient Name: Date of Service: DA [name] 12/20/2019 7:30 A M Medical Record Number: [id number] Patient Account Number: [id number] Date of Birth/Sex: Treating RN: Jan 12, 1971 (49 y.o. [name] Primary Care Dennie Vecchio: Evelena Leyden Other Clinician: Referring Demoni Gergen: Treating Marshell Rieger/Extender: Minda Meo in Treatment: 2 Active Problems Location of Pain Severity and Description of Pain Patient Has Paino No Site Locations Pain Management and Medication Current Pain Management: Electronic Signature(s) Signed: 12/20/2019 5:04:42 PM By: Zandra Abts RN, BSN Entered By: Zandra Abts on 12/20/2019  08:08:21 -------------------------------------------------------------------------------- Patient/Caregiver Education Details Patient Name: Date of Service: [name], [place] Aldine Contes 10/19/2021andnbsp7:30 A M Medical Record Number: [id number] Patient Account Number: [id number] Date of Birth/Gender: Treating RN: 01-29-1971 (49 y.o. [name] Primary Care Physician: Evelena Leyden Other Clinician: Referring Physician: Treating Physician/Extender: Minda Meo in Treatment: 2 Education Assessment Education Provided To: Patient Education Topics Provided Wound/Skin Impairment: Handouts: Skin Care Do's and Dont's Methods: Explain/Verbal Responses: Reinforcements needed Electronic Signature(s) Signed: 12/20/2019 5:04:07 PM By: Shawn Stall Entered By: Shawn Stall on 12/20/2019 08:10:55 -------------------------------------------------------------------------------- Wound Assessment Details Patient Name: Date of Service: [name], [name] 12/20/2019 7:30 A M Medical Record Number: [id number] Patient Account Number: [id number] Date of Birth/Sex: Treating RN: 1970-06-11 (49 y.o. [name] Primary Care Antanasia Kaczynski: Evelena Leyden Other Clinician: Referring Rilee Knoll: Treating Tyrisha Benninger/Extender: Minda Meo in Treatment: 2 Wound Status Wound Number: 3 Primary Etiology: Abrasion Wound Location: Right Flank Wound Status: Open Wounding Event: Trauma Comorbid History: Paraplegia Date Acquired: 11/20/2019 Weeks Of Treatment: 2 Clustered Wound: No Wound Measurements Length: (cm) 3 Width: (cm) 6.8 Depth: (cm) 2.4 Area: (cm) 16.022 Volume: (cm) 38.453 % Reduction in Area: 5.6% % Reduction in Volume: -25.9% Epithelialization: Small (1-33%) Tunneling: Yes Location 1 Position (o'clock): 10 Maximum Distance: (cm) 2 Location 2 Position (o'clock): 2 Maximum Distance: (  cm) 1.7 Undermining: No Wound  Description Classification: Full Thickness Without Exposed Support Structures Wound Margin: Distinct, outline attached Exudate Amount: Medium Exudate Type: Serosanguineous Exudate Color: red, brown Foul Odor After Cleansing: No Slough/Fibrino Yes Wound Bed Granulation Amount: Medium (34-66%) Exposed Structure Granulation Quality: Pink Fascia Exposed: No Necrotic Amount: Medium (34-66%) Fat Layer (Subcutaneous Tissue) Exposed: Yes Necrotic Quality: Adherent Slough Tendon Exposed: No Muscle Exposed: No Joint Exposed: No Bone Exposed: No Treatment Notes Wound #3 (Right Flank) 1. Cleanse With Wound Cleanser 2. Periwound Care Skin Prep 3. Primary Dressing Applied Other primary dressing (specifiy in notes) 4. Secondary Dressing Foam Border Dressing 5. Secured With Tape Notes wet to dry, HH to apply vac Electronic Signature(s) Signed: 12/20/2019 5:04:42 PM By: Zandra Abts RN, BSN Entered By: Zandra Abts on 12/20/2019 08:20:54 -------------------------------------------------------------------------------- Wound Assessment Details Patient Name: Date of Service: [name] 12/20/2019 7:30 A M Medical Record Number: [id number] Patient Account Number: [id number] Date of Birth/Sex: Treating RN: [birth year] (49 y.o. [name] Primary Care Natia Fahmy: Evelena Leyden Other Clinician: Referring Anatalia Kronk: Treating Tishara Pizano/Extender: Minda Meo in Treatment: 2 Wound Status Wound Number: 4 Primary Etiology: Pressure Ulcer Wound Location: Left Trochanter Wound Status: Open Wounding Event: Pressure Injury Comorbid History: Paraplegia Date Acquired: 10/02/2019 Weeks Of Treatment: 2 Clustered Wound: No Wound Measurements Length: (cm) 1 Width: (cm) 1 Depth: (cm) 0.2 Area: (cm) 0.785 Volume: (cm) 0.157 % Reduction in Area: 25.9% % Reduction in Volume: -48.1% Epithelialization: Small (1-33%) Tunneling: No Undermining: No Wound  Description Classification: Category/Stage IV Wound Margin: Epibole Exudate Amount: Small Exudate Type: Serosanguineous Exudate Color: red, brown Foul Odor After Cleansing: No Slough/Fibrino Yes Wound Bed Granulation Amount: Large (67-100%) Exposed Structure Granulation Quality: Pink Fascia Exposed: No Necrotic Amount: Small (1-33%) Fat Layer (Subcutaneous Tissue) Exposed: Yes Necrotic Quality: Adherent Slough Tendon Exposed: No Muscle Exposed: No Joint Exposed: No Bone Exposed: No Treatment Notes Wound #4 (Left Trochanter) 1. Cleanse With Wound Cleanser 2. Periwound Care Skin Prep 3. Primary Dressing Applied Calcium Alginate Ag 4. Secondary Dressing Foam Border Dressing 5. Secured With Secretary/administrator) Signed: 12/20/2019 5:04:42 PM By: Zandra Abts RN, BSN Entered By: Zandra Abts on 12/20/2019 08:21:10 -------------------------------------------------------------------------------- Vitals Details Patient Name: Date of Service: [name], [name] 12/20/2019 7:30 A M Medical Record Number: [id number] Patient Account Number: [id number] Date of Birth/Sex: Treating RN: August 01, 1970 (49 y.o. [name] Primary Care Shey Bartmess: Evelena Leyden Other Clinician: Referring Adessa Primiano: Treating Macario Shear/Extender: Minda Meo in Treatment: 2 Vital Signs Time Taken: 08:07 Temperature (F): 98.1 Height (in): 79 Pulse (bpm): 98 Weight (lbs): 325 Respiratory Rate (breaths/min): 18 Body Mass Index (BMI): 36.6 Blood Pressure (mmHg): 129/90 Reference Range: 80 - 120 mg / dl Electronic Signature(s) Signed: 12/20/2019 5:04:42 PM By: Zandra Abts RN, BSN Entered By: Zandra Abts on 12/20/2019 08:08:16
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
## 2019-12-21 DIAGNOSIS — L89224 Pressure ulcer of left hip, stage 4: Secondary | ICD-10-CM | POA: Diagnosis not present

## 2019-12-21 DIAGNOSIS — Z960 Presence of urogenital implants: Secondary | ICD-10-CM | POA: Diagnosis not present

## 2019-12-21 DIAGNOSIS — L98423 Non-pressure chronic ulcer of back with necrosis of muscle: Secondary | ICD-10-CM | POA: Diagnosis not present

## 2019-12-21 DIAGNOSIS — L03319 Cellulitis of trunk, unspecified: Secondary | ICD-10-CM | POA: Diagnosis not present

## 2019-12-21 DIAGNOSIS — S31000D Unspecified open wound of lower back and pelvis without penetration into retroperitoneum, subsequent encounter: Secondary | ICD-10-CM | POA: Diagnosis not present

## 2019-12-21 DIAGNOSIS — G8221 Paraplegia, complete: Secondary | ICD-10-CM | POA: Diagnosis not present

## 2019-12-23 DIAGNOSIS — L89224 Pressure ulcer of left hip, stage 4: Secondary | ICD-10-CM | POA: Diagnosis not present

## 2019-12-23 DIAGNOSIS — Z960 Presence of urogenital implants: Secondary | ICD-10-CM | POA: Diagnosis not present

## 2019-12-23 DIAGNOSIS — L03319 Cellulitis of trunk, unspecified: Secondary | ICD-10-CM | POA: Diagnosis not present

## 2019-12-23 DIAGNOSIS — G8221 Paraplegia, complete: Secondary | ICD-10-CM | POA: Diagnosis not present

## 2019-12-23 DIAGNOSIS — S31000D Unspecified open wound of lower back and pelvis without penetration into retroperitoneum, subsequent encounter: Secondary | ICD-10-CM | POA: Diagnosis not present

## 2019-12-26 DIAGNOSIS — L03319 Cellulitis of trunk, unspecified: Secondary | ICD-10-CM | POA: Diagnosis not present

## 2019-12-26 DIAGNOSIS — Z960 Presence of urogenital implants: Secondary | ICD-10-CM | POA: Diagnosis not present

## 2019-12-26 DIAGNOSIS — G8221 Paraplegia, complete: Secondary | ICD-10-CM | POA: Diagnosis not present

## 2019-12-26 DIAGNOSIS — S31000D Unspecified open wound of lower back and pelvis without penetration into retroperitoneum, subsequent encounter: Secondary | ICD-10-CM | POA: Diagnosis not present

## 2019-12-26 DIAGNOSIS — L89224 Pressure ulcer of left hip, stage 4: Secondary | ICD-10-CM | POA: Diagnosis not present

## 2019-12-28 DIAGNOSIS — G8221 Paraplegia, complete: Secondary | ICD-10-CM | POA: Diagnosis not present

## 2019-12-28 DIAGNOSIS — S31000D Unspecified open wound of lower back and pelvis without penetration into retroperitoneum, subsequent encounter: Secondary | ICD-10-CM | POA: Diagnosis not present

## 2019-12-28 DIAGNOSIS — Z960 Presence of urogenital implants: Secondary | ICD-10-CM | POA: Diagnosis not present

## 2019-12-28 DIAGNOSIS — L89224 Pressure ulcer of left hip, stage 4: Secondary | ICD-10-CM | POA: Diagnosis not present

## 2019-12-28 DIAGNOSIS — L03319 Cellulitis of trunk, unspecified: Secondary | ICD-10-CM | POA: Diagnosis not present

## 2019-12-30 DIAGNOSIS — G8221 Paraplegia, complete: Secondary | ICD-10-CM | POA: Diagnosis not present

## 2019-12-30 DIAGNOSIS — Z960 Presence of urogenital implants: Secondary | ICD-10-CM | POA: Diagnosis not present

## 2019-12-30 DIAGNOSIS — S31000D Unspecified open wound of lower back and pelvis without penetration into retroperitoneum, subsequent encounter: Secondary | ICD-10-CM | POA: Diagnosis not present

## 2019-12-30 DIAGNOSIS — L89224 Pressure ulcer of left hip, stage 4: Secondary | ICD-10-CM | POA: Diagnosis not present

## 2019-12-30 DIAGNOSIS — L03319 Cellulitis of trunk, unspecified: Secondary | ICD-10-CM | POA: Diagnosis not present

## 2020-01-02 ENCOUNTER — Encounter (HOSPITAL_BASED_OUTPATIENT_CLINIC_OR_DEPARTMENT_OTHER): Payer: Medicare Other | Attending: Internal Medicine | Admitting: Internal Medicine

## 2020-01-02 ENCOUNTER — Other Ambulatory Visit (HOSPITAL_COMMUNITY)
Admission: RE | Admit: 2020-01-02 | Discharge: 2020-01-02 | Disposition: A | Discharge status: Home / Self Care | Payer: Medicare Other | Source: Other Acute Inpatient Hospital | Attending: Internal Medicine | Admitting: Internal Medicine

## 2020-01-02 ENCOUNTER — Other Ambulatory Visit: Payer: Self-pay

## 2020-01-02 DIAGNOSIS — L03319 Cellulitis of trunk, unspecified: Secondary | ICD-10-CM | POA: Diagnosis not present

## 2020-01-02 DIAGNOSIS — Z960 Presence of urogenital implants: Secondary | ICD-10-CM | POA: Diagnosis not present

## 2020-01-02 DIAGNOSIS — Z1610 Resistance to unspecified beta lactam antibiotics: Secondary | ICD-10-CM | POA: Insufficient documentation

## 2020-01-02 DIAGNOSIS — L98423 Non-pressure chronic ulcer of back with necrosis of muscle: Secondary | ICD-10-CM | POA: Insufficient documentation

## 2020-01-02 DIAGNOSIS — B9561 Methicillin susceptible Staphylococcus aureus infection as the cause of diseases classified elsewhere: Secondary | ICD-10-CM | POA: Insufficient documentation

## 2020-01-02 DIAGNOSIS — L89224 Pressure ulcer of left hip, stage 4: Secondary | ICD-10-CM | POA: Diagnosis not present

## 2020-01-02 DIAGNOSIS — G8221 Paraplegia, complete: Secondary | ICD-10-CM | POA: Insufficient documentation

## 2020-01-02 DIAGNOSIS — S31000D Unspecified open wound of lower back and pelvis without penetration into retroperitoneum, subsequent encounter: Secondary | ICD-10-CM | POA: Diagnosis not present

## 2020-01-02 DIAGNOSIS — B964 Proteus (mirabilis) (morganii) as the cause of diseases classified elsewhere: Secondary | ICD-10-CM | POA: Insufficient documentation

## 2020-01-02 NOTE — Progress Notes (Signed)
[name] ([id number]) Visit Report for 01/02/2020 Arrival Information Details Patient Name: Date of Service: DA [name], [name] [name] 01/02/2020 8:00 A M Medical Record Number: [id number] Patient Account Number: [id number] Date of Birth/Sex: Treating RN: 1970/09/27 (49 y.o. Bayard Hugger, Bonita Quin Primary Care Lear Carstens: Evelena Leyden Other Clinician: Referring Bich Mchaney: Treating Demetrus Pavao/Extender: Minda Meo in Treatment: 3 Visit Information History Since Last Visit Added or deleted any medications: No Patient Arrived: Wheel Chair Any new allergies or adverse reactions: No Arrival Time: 08:29 Had a fall or experienced change in No Accompanied By: self activities of daily living that may affect Transfer Assistance: None risk of falls: Patient Identification Verified: Yes Signs or symptoms of abuse/neglect since last visito No Secondary Verification Process Completed: Yes Hospitalized since last visit: No Patient Requires Transmission-Based Precautions: No Implantable device outside of the clinic excluding No Patient Has Alerts: No cellular tissue based products placed in the center since last visit: Has Dressing in Place as Prescribed: Yes Pain Present Now: No Electronic Signature(s) Signed: 01/02/2020 5:46:37 PM By: Zenaida Deed RN, BSN Entered By: Zenaida Deed on 01/02/2020 08:31:37 -------------------------------------------------------------------------------- Clinic Level of Care Assessment Details Patient Name: Date of Service: [name], [place] [name] 01/02/2020 8:00 A M Medical Record Number: [id number] Patient Account Number: [id number] Date of Birth/Sex: Treating RN: 02/08/1971 (49 y.o. [name] Primary Care Franklin Baumbach: Evelena Leyden Other Clinician: Referring Jennise Both: Treating Guenther Dunshee/Extender: Minda Meo in Treatment: 3 Clinic Level of Care Assessment Items TOOL 4 Quantity Score X- 1 0 Use when only an  EandM is performed on FOLLOW-UP visit ASSESSMENTS - Nursing Assessment / Reassessment X- 1 10 Reassessment of Co-morbidities (includes updates in patient status) X- 1 5 Reassessment of Adherence to Treatment Plan ASSESSMENTS - Wound and Skin A ssessment / Reassessment []  - 0 Simple Wound Assessment / Reassessment - one wound X- 2 5 Complex Wound Assessment / Reassessment - multiple wounds []  - 0 Dermatologic / Skin Assessment (not related to wound area) ASSESSMENTS - Focused Assessment []  - 0 Circumferential Edema Measurements - multi extremities []  - 0 Nutritional Assessment / Counseling / Intervention X- 1 5 Lower Extremity Assessment (monofilament, tuning fork, pulses) []  - 0 Peripheral Arterial Disease Assessment (using hand held doppler) ASSESSMENTS - Ostomy and/or Continence Assessment and Care []  - 0 Incontinence Assessment and Management []  - 0 Ostomy Care Assessment and Management (repouching, etc.) PROCESS - Coordination of Care X - Simple Patient / Family Education for ongoing care 1 15 []  - 0 Complex (extensive) Patient / Family Education for ongoing care X- 1 10 Staff obtains Consents, Records, T Results / Process Orders est X- 1 10 Staff telephones HHA, Nursing Homes / Clarify orders / etc []  - 0 Routine Transfer to another Facility (non-emergent condition) []  - 0 Routine Hospital Admission (non-emergent condition) []  - 0 New Admissions / / Ordering NPWT Apligraf, etc. , []  - 0 Emergency Hospital Admission (emergent condition) X- 1 10 Simple Discharge Coordination []  - 0 Complex (extensive) Discharge Coordination PROCESS - Special Needs []  - 0 Pediatric / Minor Patient Management []  - 0 Isolation Patient Management []  - 0 Hearing / Language / Visual special needs []  - 0 Assessment of Community assistance (transportation, D/C planning, etc.) []  - 0 Additional assistance / Altered mentation []  - 0 Support Surface(s)  Assessment (bed, cushion, seat, etc.) INTERVENTIONS - Wound Cleansing / Measurement []  - 0 Simple Wound Cleansing - one wound X- 2 5 Complex  Wound Cleansing - multiple wounds X- 1 5 Wound Imaging (photographs - any number of wounds) []  - 0 Wound Tracing (instead of photographs) []  - 0 Simple Wound Measurement - one wound X- 2 5 Complex Wound Measurement - multiple wounds INTERVENTIONS - Wound Dressings []  - 0 Small Wound Dressing one or multiple wounds []  - 0 Medium Wound Dressing one or multiple wounds X- 1 20 Large Wound Dressing one or multiple wounds []  - 0 Application of Medications - topical []  - 0 Application of Medications - injection INTERVENTIONS - Miscellaneous []  - 0 External ear exam X- 1 5 Specimen Collection (cultures, biopsies, blood, body fluids, etc.) X- 1 5 Specimen(s) / Culture(s) sent or taken to Lab for analysis []  - 0 Patient Transfer (multiple staff / Nurse, adultHoyer Lift / Similar devices) []  - 0 Simple Staple / Suture removal (25 or less) []  - 0 Complex Staple / Suture removal (26 or more) []  - 0 Hypo / Hyperglycemic Management (close monitor of Blood Glucose) []  - 0 Ankle / Brachial Index (ABI) - do not check if billed separately X- 1 5 Vital Signs Has the patient been seen at the hospital within the last three years: Yes Total Score: 135 Level Of Care: New/Established - Level 4 Electronic Signature(s) Signed: 01/02/2020 5:57:05 PM By: Zandra AbtsLynch, Shatara RN, BSN Entered By: Zandra AbtsLynch, Shatara on 01/02/2020 09:35:27 -------------------------------------------------------------------------------- Encounter Discharge Information Details Patient Name: Date of Service: [name], [place] [name] 01/02/2020 8:00 A M Medical Record Number: [id number] Patient Account Number: [id number] Date of Birth/Sex: Treating RN: 08/04/1970 (49 y.o. [name] [name]) Deaton, Bobbi Primary Care [name]: Evelena LeydenLilland, Alana Other Clinician: Referring Konya Fauble: Treating Branden Shallenberger/Extender: Minda Meoobson,  Michael Lilland, Alana Weeks in Treatment: 3 Encounter Discharge Information Items Discharge Condition: Stable Ambulatory Status: Wheelchair Discharge Destination: Home Transportation: Private Auto Accompanied By: self Schedule Follow-up Appointment: Yes Clinical Summary of Care: Electronic Signature(s) Signed: 01/02/2020 6:07:40 PM By: Shawn Stalleaton, Bobbi Entered By: Shawn Stalleaton, Bobbi on 01/02/2020 09:20:44 -------------------------------------------------------------------------------- Lower Extremity Assessment Details Patient Name: Date of Service: [name], [place] [name] 01/02/2020 8:00 A M Medical Record Number: [id number] Patient Account Number: [id number] Date of Birth/Sex: Treating RN: 07/18/1970 (49 y.o. Damaris SchoonerM) Boehlein, Linda Primary Care Chardae Mulkern: Evelena LeydenLilland, Alana Other Clinician: Referring Hinton Luellen: Treating Avina Eberle/Extender: Minda Meoobson, Michael Lilland, Alana Weeks in Treatment: 3 Electronic Signature(s) Signed: 01/02/2020 5:46:37 PM By: Zenaida DeedBoehlein, Linda RN, BSN Entered By: Zenaida DeedBoehlein, Linda on 01/02/2020 08:35:14 -------------------------------------------------------------------------------- Multi Wound Chart Details Patient Name: Date of Service: [name], [place] [name] 01/02/2020 8:00 A M Medical Record Number: [id number] Patient Account Number: [id number] Date of Birth/Sex: Treating RN: 11/02/1970 (49 y.o. [name] KollerM) Lynch, Shatara Primary Care Sajan Cheatwood: Evelena LeydenLilland, Alana Other Clinician: Referring Lenise Jr: Treating Fusaye Wachtel/Extender: Minda Meoobson, Michael Lilland, Alana Weeks in Treatment: 3 Vital Signs Height(in): 79 Pulse(bpm): 66 Weight(lbs): 325 Blood Pressure(mmHg): 117/80 Body Mass Index(BMI): 37 Temperature(F): 97.5 Respiratory Rate(breaths/min): 18 Photos: [3:No Photos Right Flank] [4:No Photos Left Trochanter] [N/A:N/A N/A] Wound Location: [3:Trauma] [4:Pressure Injury] [N/A:N/A] Wounding Event: [3:Abrasion] [4:Pressure Ulcer] [N/A:N/A] Primary Etiology: [3:Paraplegia] [4:Paraplegia]  [N/A:N/A] Comorbid History: [3:11/20/2019] [4:10/02/2019] [N/A:N/A] Date Acquired: [3:3] [4:3] [N/A:N/A] Weeks of Treatment: [3:Open] [4:Open] [N/A:N/A] Wound Status: [3:1.5x3.3x1.9] [4:1x0.5x0.1] [N/A:N/A] Measurements L x W x D (cm) [3:3.888] [4:0.393] [N/A:N/A] A (cm) : rea [3:7.387] [4:0.039] [N/A:N/A] Volume (cm) : [3:77.10%] [4:62.90%] [N/A:N/A] % Reduction in A rea: [3:75.80%] [4:63.20%] [N/A:N/A] % Reduction in Volume: [3:Full Thickness Without Exposed] [4:Category/Stage IV] [N/A:N/A] Classification: [3:Support Structures Medium] [4:Small] [N/A:N/A] Exudate Amount: [3:Serosanguineous] [4:Serous] [N/A:N/A] Exudate Type: [3:red, brown] [4:amber] [N/A:N/A] Exudate  Color: [3:Distinct, outline attached] [4:Thickened] [N/A:N/A] Wound Margin: [3:Large (67-100%)] [4:Large (67-100%)] [N/A:N/A] Granulation Amount: [3:Red] [4:Pink] [N/A:N/A] Granulation Quality: [3:None Present (0%)] [4:None Present (0%)] [N/A:N/A] Necrotic Amount: [3:Fat Layer (Subcutaneous Tissue): Yes Fat Layer (Subcutaneous Tissue): Yes N/A] Exposed Structures: [3:Fascia: No Tendon: No Muscle: No Joint: No Bone: No Small (1-33%)] [4:Fascia: No Tendon: No Muscle: No Joint: No Bone: No Small (1-33%)] [N/A:N/A] Epithelialization: [3:small amount of purulent drainage] [4:N/A] [N/A:N/A] Assessment Notes: [3:expressed from small hole in center of wound] Treatment Notes Electronic Signature(s) Signed: 01/02/2020 5:37:06 PM By: Baltazar Najjar MD Signed: 01/02/2020 5:57:05 PM By: Zandra Abts RN, BSN Entered By: Baltazar Najjar on 01/02/2020 09:09:38 -------------------------------------------------------------------------------- Multi-Disciplinary Care Plan Details Patient Name: Date of Service: [name], [place] [name] 01/02/2020 8:00 A M Medical Record Number: [id number] Patient Account Number: [id number] Date of Birth/Sex: Treating RN: Apr 04, 1970 (49 y.o. [name] Primary Care Ileigh Mettler: Evelena Leyden Other  Clinician: Referring Tytan Sandate: Treating Theona Muhs/Extender: Minda Meo in Treatment: 3 Active Inactive Wound/Skin Impairment Nursing Diagnoses: Knowledge deficit related to ulceration/compromised skin integrity Goals: Patient/caregiver will verbalize understanding of skin care regimen Date Initiated: 12/06/2019 Target Resolution Date: 01/13/2020 Goal Status: Active Ulcer/skin breakdown will have a volume reduction of 30% by week 4 Date Initiated: 12/06/2019 Target Resolution Date: 01/13/2020 Goal Status: Active Interventions: Assess patient/caregiver ability to obtain necessary supplies Assess patient/caregiver ability to perform ulcer/skin care regimen upon admission and as needed Assess ulceration(s) every visit Notes: Electronic Signature(s) Signed: 01/02/2020 5:57:05 PM By: Zandra Abts RN, BSN Entered By: Zandra Abts on 01/02/2020 09:33:49 -------------------------------------------------------------------------------- Pain Assessment Details Patient Name: Date of Service: [name], [place] [name] 01/02/2020 8:00 A M Medical Record Number: [id number] Patient Account Number: [id number] Date of Birth/Sex: Treating RN: [birth year] (49 y.o. [name] Primary Care Corley Kohls: Evelena Leyden Other Clinician: Referring Jerrion Tabbert: Treating Exander Shaul/Extender: Minda Meo in Treatment: 3 Active Problems Location of Pain Severity and Description of Pain Patient Has Paino No Site Locations Rate the pain. Current Pain Level: 0 Pain Management and Medication Current Pain Management: Electronic Signature(s) Signed: 01/02/2020 5:46:37 PM By: Zenaida Deed RN, BSN Entered By: Zenaida Deed on 01/02/2020 08:35:07 -------------------------------------------------------------------------------- Patient/Caregiver Education Details Patient Name: Date of Service: [name], [place] Aldine Contes 11/1/2021andnbsp8:00 A M Medical Record Number:  [id number] Patient Account Number: [id number] Date of Birth/Gender: Treating RN: [birth year] (49 y.o. [name] Primary Care Physician: Evelena Leyden Other Clinician: Referring Physician: Treating Physician/Extender: Minda Meo in Treatment: 3 Education Assessment Education Provided To: Patient Education Topics Provided Wound/Skin Impairment: Methods: Explain/Verbal Responses: State content correctly Electronic Signature(s) Signed: 01/02/2020 5:57:05 PM By: Zandra Abts RN, BSN Entered By: Zandra Abts on 01/02/2020 09:33:58 -------------------------------------------------------------------------------- Wound Assessment Details Patient Name: Date of Service: [name], [place] [name] 01/02/2020 8:00 A M Medical Record Number: [id number] Patient Account Number: [id number] Date of Birth/Sex: Treating RN: June 04, 1970 (49 y.o. [name] Primary Care Kinnley Paulson: Evelena Leyden Other Clinician: Referring Milad Bublitz: Treating Dream Harman/Extender: Minda Meo in Treatment: 3 Wound Status Wound Number: 3 Primary Etiology: Abrasion Wound Location: Right Flank Wound Status: Open Wounding Event: Trauma Comorbid History: Paraplegia Date Acquired: 11/20/2019 Weeks Of Treatment: 3 Clustered Wound: No Wound Measurements Length: (cm) 1.5 Width: (cm) 3.3 Depth: (cm) 1.9 Area: (cm) 3.888 Volume: (cm) 7.387 % Reduction in Area: 77.1% % Reduction in Volume: 75.8% Epithelialization: Small (1-33%) Tunneling: No Undermining: No Wound Description Classification: Full Thickness Without Exposed Support Structures Wound Margin:  Distinct, outline attached Exudate Amount: Medium Exudate Type: Serosanguineous Exudate Color: red, brown Foul Odor After Cleansing: No Slough/Fibrino Yes Wound Bed Granulation Amount: Large (67-100%) Exposed Structure Granulation Quality: Red Fascia Exposed: No Necrotic Amount: None Present (0%) Fat  Layer (Subcutaneous Tissue) Exposed: Yes Tendon Exposed: No Muscle Exposed: No Joint Exposed: No Bone Exposed: No Assessment Notes small amount of purulent drainage expressed from small hole in center of wound Treatment Notes Wound #3 (Right Flank) 1. Cleanse With Wound Cleanser 2. Periwound Care Skin Prep 3. Primary Dressing Applied Other primary dressing (specifiy in notes) 4. Secondary Dressing Foam Border Dressing 5. Secured With Medipore tape Self Adhesive Bandage Notes wet to dry, HH to apply vac Electronic Signature(s) Signed: 01/02/2020 5:46:37 PM By: Zenaida Deed RN, BSN Entered By: Zenaida Deed on 01/02/2020 08:46:11 -------------------------------------------------------------------------------- Wound Assessment Details Patient Name: Date of Service: [name], [place] [name] 01/02/2020 8:00 A M Medical Record Number: [id number] Patient Account Number: [id number] Date of Birth/Sex: Treating RN: 28-Sep-1970 (49 y.o. [name] Primary Care Jude Linck: Evelena Leyden Other Clinician: Referring Brentlee Sciara: Treating Johnathon Olden/Extender: Minda Meo in Treatment: 3 Wound Status Wound Number: 4 Primary Etiology: Pressure Ulcer Wound Location: Left Trochanter Wound Status: Open Wounding Event: Pressure Injury Comorbid History: Paraplegia Date Acquired: 10/02/2019 Weeks Of Treatment: 3 Clustered Wound: No Wound Measurements Length: (cm) 1 Width: (cm) 0.5 Depth: (cm) 0.1 Area: (cm) 0.393 Volume: (cm) 0.039 % Reduction in Area: 62.9% % Reduction in Volume: 63.2% Epithelialization: Small (1-33%) Tunneling: No Undermining: No Wound Description Classification: Category/Stage IV Wound Margin: Thickened Exudate Amount: Small Exudate Type: Serous Exudate Color: amber Foul Odor After Cleansing: No Slough/Fibrino No Wound Bed Granulation Amount: Large (67-100%) Exposed Structure Granulation Quality: Pink Fascia Exposed: No Necrotic  Amount: None Present (0%) Fat Layer (Subcutaneous Tissue) Exposed: Yes Tendon Exposed: No Muscle Exposed: No Joint Exposed: No Bone Exposed: No Treatment Notes Wound #4 (Left Trochanter) 1. Cleanse With Wound Cleanser 2. Periwound Care Skin Prep 3. Primary Dressing Applied Calcium Alginate Ag 4. Secondary Dressing Foam Border Dressing 5. Secured With Advice worker) Signed: 01/02/2020 5:46:37 PM By: Zenaida Deed RN, BSN Entered By: Zenaida Deed on 01/02/2020 08:46:32 -------------------------------------------------------------------------------- Vitals Details Patient Name: Date of Service: [name], [place] [name] 01/02/2020 8:00 A M Medical Record Number: [id number] Patient Account Number: [id number] Date of Birth/Sex: Treating RN: Sep 03, 1970 (49 y.o. [name] Primary Care Lesle Faron: Evelena Leyden Other Clinician: Referring Tarron Krolak: Treating Mariusz Jubb/Extender: Minda Meo in Treatment: 3 Vital Signs Time Taken: 08:33 Temperature (F): 97.5 Height (in): 79 Pulse (bpm): 66 Source: Stated Respiratory Rate (breaths/min): 18 Weight (lbs): 325 Blood Pressure (mmHg): 117/80 Source: Stated Reference Range: 80 - 120 mg / dl Body Mass Index (BMI): 36.6 Electronic Signature(s) Signed: 01/02/2020 5:46:37 PM By: Zenaida Deed RN, BSN Entered By: Zenaida Deed on 01/02/2020 08:34:14
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
## 2020-01-04 DIAGNOSIS — L03319 Cellulitis of trunk, unspecified: Secondary | ICD-10-CM | POA: Diagnosis not present

## 2020-01-04 DIAGNOSIS — Z960 Presence of urogenital implants: Secondary | ICD-10-CM | POA: Diagnosis not present

## 2020-01-04 DIAGNOSIS — G8221 Paraplegia, complete: Secondary | ICD-10-CM | POA: Diagnosis not present

## 2020-01-04 DIAGNOSIS — S31000D Unspecified open wound of lower back and pelvis without penetration into retroperitoneum, subsequent encounter: Secondary | ICD-10-CM | POA: Diagnosis not present

## 2020-01-04 DIAGNOSIS — L89224 Pressure ulcer of left hip, stage 4: Secondary | ICD-10-CM | POA: Diagnosis not present

## 2020-01-06 DIAGNOSIS — L03319 Cellulitis of trunk, unspecified: Secondary | ICD-10-CM | POA: Diagnosis not present

## 2020-01-06 DIAGNOSIS — L89224 Pressure ulcer of left hip, stage 4: Secondary | ICD-10-CM | POA: Diagnosis not present

## 2020-01-06 DIAGNOSIS — G8221 Paraplegia, complete: Secondary | ICD-10-CM | POA: Diagnosis not present

## 2020-01-06 DIAGNOSIS — Z960 Presence of urogenital implants: Secondary | ICD-10-CM | POA: Diagnosis not present

## 2020-01-06 DIAGNOSIS — S31000D Unspecified open wound of lower back and pelvis without penetration into retroperitoneum, subsequent encounter: Secondary | ICD-10-CM | POA: Diagnosis not present

## 2020-01-06 LAB — AEROBIC CULTURE W GRAM STAIN (SUPERFICIAL SPECIMEN)

## 2020-01-09 ENCOUNTER — Encounter (HOSPITAL_BASED_OUTPATIENT_CLINIC_OR_DEPARTMENT_OTHER): Payer: Medicare Other | Admitting: Internal Medicine

## 2020-01-09 ENCOUNTER — Other Ambulatory Visit: Payer: Self-pay

## 2020-01-09 DIAGNOSIS — G8221 Paraplegia, complete: Secondary | ICD-10-CM | POA: Diagnosis not present

## 2020-01-09 DIAGNOSIS — L89224 Pressure ulcer of left hip, stage 4: Secondary | ICD-10-CM | POA: Diagnosis not present

## 2020-01-09 DIAGNOSIS — L98423 Non-pressure chronic ulcer of back with necrosis of muscle: Secondary | ICD-10-CM | POA: Diagnosis not present

## 2020-01-09 DIAGNOSIS — B9561 Methicillin susceptible Staphylococcus aureus infection as the cause of diseases classified elsewhere: Secondary | ICD-10-CM | POA: Diagnosis not present

## 2020-01-09 DIAGNOSIS — B964 Proteus (mirabilis) (morganii) as the cause of diseases classified elsewhere: Secondary | ICD-10-CM | POA: Diagnosis not present

## 2020-01-09 DIAGNOSIS — Z1610 Resistance to unspecified beta lactam antibiotics: Secondary | ICD-10-CM | POA: Diagnosis not present

## 2020-01-10 NOTE — Progress Notes (Signed)
KEEGAN, DUCEY ([id number]) Visit Report for 01/09/2020 Arrival Information Details Patient Name: Date of Service: [name], [name] 01/09/2020 8:15 A M Medical Record Number: [id number] Patient Account Number: [id number] Date of Birth/Sex: Treating RN: 1970/09/18 (49 y.o. Jerilynn Mages) Carlene Coria Primary Care Catie Chiao: [name] Other Clinician: Referring Zeena Starkel: Treating Kelse Ploch/Extender: Marin Shutter in Treatment: 4 Visit Information History Since Last Visit All ordered tests and consults were completed: No Patient Arrived: Wheel Chair Added or deleted any medications: No Arrival Time: 08:17 Any new allergies or adverse reactions: No Accompanied By: self Had a fall or experienced change in No Transfer Assistance: None activities of daily living that may affect Patient Identification Verified: Yes risk of falls: Secondary Verification Process Completed: Yes Signs or symptoms of abuse/neglect since last visito No Patient Requires Transmission-Based Precautions: No Hospitalized since last visit: No Patient Has Alerts: No Implantable device outside of the clinic excluding No cellular tissue based products placed in the center since last visit: Has Dressing in Place as Prescribed: Yes Has Compression in Place as Prescribed: Yes Pain Present Now: No Electronic Signature(s) Signed: 01/09/2020 5:51:27 PM By: Carlene Coria RN Entered By: Carlene Coria on 01/09/2020 08:18:52 -------------------------------------------------------------------------------- Encounter Discharge Information Details Patient Name: Date of Service: [name], [name] 01/09/2020 8:15 A M Medical Record Number: [id number] Patient Account Number: [id number] Date of Birth/Sex: Treating RN: Nov 12, 1970 (49 y.o. Hessie Diener Primary Care Keria Widrig: [name] Other Clinician: Referring Yerick Eggebrecht: Treating Terrence Wishon/Extender: Marin Shutter in Treatment: 4 Encounter  Discharge Information Items Post Procedure Vitals Discharge Condition: Stable Temperature (F): 98 Ambulatory Status: Wheelchair Pulse (bpm): 73 Discharge Destination: Home Respiratory Rate (breaths/min): 18 Transportation: Private Auto Blood Pressure (mmHg): 156/84 Accompanied By: self Schedule Follow-up Appointment: Yes Clinical Summary of Care: Electronic Signature(s) Signed: 01/09/2020 5:46:03 PM By: Deon Pilling Entered By: Deon Pilling on 01/09/2020 09:15:15 -------------------------------------------------------------------------------- Multi Wound Chart Details Patient Name: Date of Service: [name], [place] L. 01/09/2020 8:15 A M Medical Record Number: [id number] Patient Account Number: [id number] Date of Birth/Sex: Treating RN: 05/10/1970 (49 y.o. Janyth Contes Primary Care Karrie Fluellen: [name] Other Clinician: Referring Kadience Macchi: Treating Onyinyechi Huante/Extender: Marin Shutter in Treatment: 4 Vital Signs Height(in): 79 Pulse(bpm): 107 Weight(lbs): 325 Blood Pressure(mmHg): 156/84 Body Mass Index(BMI): 37 Temperature(F): 98 Respiratory Rate(breaths/min): 18 Photos: [3:No Photos Right Flank] [4:No Photos Left Trochanter] [N/A:N/A N/A] Wound Location: [3:Trauma] [4:Pressure Injury] [N/A:N/A] Wounding Event: [3:Abrasion] [4:Pressure Ulcer] [N/A:N/A] Primary Etiology: [3:Paraplegia] [4:Paraplegia] [N/A:N/A] Comorbid History: [3:11/20/2019] [4:10/02/2019] [N/A:N/A] Date Acquired: [3:4] [4:4] [N/A:N/A] Weeks of Treatment: [3:Open] [4:Open] [N/A:N/A] Wound Status: [3:0.9x3.4x2.5] [4:2x0.5x0.3] [N/A:N/A] Measurements L x W x D (cm) [3:2.403] [4:0.785] [N/A:N/A] A (cm) : rea [2:5.956] [4:0.236] [N/A:N/A] Volume (cm) : [3:85.80%] [4:25.90%] [N/A:N/A] % Reduction in A rea: [3:80.30%] [4:-122.60%] [N/A:N/A] % Reduction in Volume: [3:Full Thickness Without Exposed] [4:Category/Stage IV] [N/A:N/A] Classification: [3:Support Structures Medium]  [4:Small] [N/A:N/A] Exudate A mount: [3:Purulent] [4:Serous] [N/A:N/A] Exudate Type: [3:yellow, brown, green] [4:amber] [N/A:N/A] Exudate Color: [3:Distinct, outline attached] [4:Thickened] [N/A:N/A] Wound Margin: [3:Large (67-100%)] [4:Large (67-100%)] [N/A:N/A] Granulation A mount: [3:Red] [4:Pink] [N/A:N/A] Granulation Quality: [3:Small (1-33%)] [4:None Present (0%)] [N/A:N/A] Necrotic A mount: [3:Fat Layer (Subcutaneous Tissue): Yes Fat Layer (Subcutaneous Tissue): Yes N/A] Exposed Structures: [3:Fascia: No Tendon: No Muscle: No Joint: No Bone: No Small (1-33%)] [4:Fascia: No Tendon: No Muscle: No Joint: No Bone: No Small (1-33%)] [N/A:N/A] Epithelialization: [3:N/A] [4:Debridement - Selective/Open Wound N/A] Debridement: Pre-procedure Verification/Time Out N/A [4:09:00] [N/A:N/A] Taken: [  3:N/A] [4:Callus] [N/A:N/A] Tissue Debrided: [3:N/A] [4:Skin/Epidermis] [N/A:N/A] Level: [3:N/A] [4:1] [N/A:N/A] Debridement A (sq cm): [3:rea N/A] [4:Curette] [N/A:N/A] Instrument: [3:N/A] [4:Minimum] [N/A:N/A] Bleeding: [3:N/A] [4:Pressure] [N/A:N/A] Hemostasis A chieved: [3:N/A] [4:0] [N/A:N/A] Procedural Pain: [3:N/A] [4:0] [N/A:N/A] Post Procedural Pain: [3:N/A] [4:Procedure was tolerated well] [N/A:N/A] Debridement Treatment Response: [3:N/A] [4:2x0.5x0.3] [N/A:N/A] Post Debridement Measurements L x W x D (cm) [3:N/A] [4:0.236] [N/A:N/A] Post Debridement Volume: (cm) [3:N/A] [4:Category/Stage IV] [N/A:N/A] Post Debridement Stage: [3:induration to periwound] [4:N/A] [N/A:N/A] Assessment Notes: [3:N/A] [4:Debridement] [N/A:N/A] Treatment Notes Electronic Signature(s) Signed: 01/09/2020 6:16:40 PM By: Levan Hurst RN, BSN Signed: 01/10/2020 3:11:24 PM By: Linton Ham MD Entered By: Linton Ham on 01/09/2020 09:03:38 -------------------------------------------------------------------------------- Multi-Disciplinary Care Plan Details Patient Name: Date of Service: [name], [name] YNE  L. 01/09/2020 8:15 A M Medical Record Number: [id number] Patient Account Number: [id number] Date of Birth/Sex: Treating RN: 13-Mar-1970 (49 y.o. Janyth Contes Primary Care Leetta Hendriks: [name] Other Clinician: Referring Twanna Resh: Treating Doralee Kocak/Extender: Marin Shutter in Treatment: 4 Active Inactive Wound/Skin Impairment Nursing Diagnoses: Knowledge deficit related to ulceration/compromised skin integrity Goals: Patient/caregiver will verbalize understanding of skin care regimen Date Initiated: 12/06/2019 Target Resolution Date: 02/10/2020 Goal Status: Active Ulcer/skin breakdown will have a volume reduction of 30% by week 4 Date Initiated: 12/06/2019 Date Inactivated: 01/09/2020 Target Resolution Date: 01/13/2020 Goal Status: Met Ulcer/skin breakdown will have a volume reduction of 50% by week 8 Date Initiated: 01/09/2020 Target Resolution Date: 02/10/2020 Goal Status: Active Interventions: Assess patient/caregiver ability to obtain necessary supplies Assess patient/caregiver ability to perform ulcer/skin care regimen upon admission and as needed Assess ulceration(s) every visit Notes: Electronic Signature(s) Signed: 01/09/2020 6:16:40 PM By: Levan Hurst RN, BSN Entered By: Levan Hurst on 01/09/2020 09:10:27 -------------------------------------------------------------------------------- Pain Assessment Details Patient Name: Date of Service: [name], [name] 01/09/2020 8:15 A M Medical Record Number: [id number] Patient Account Number: [id number] Date of Birth/Sex: Treating RN: [birth year] (49 y.o. Oval Linsey Primary Care Nalee Lightle: [name] Other Clinician: Referring Jveon Pound: Treating Rosaura Bolon/Extender: Marin Shutter in Treatment: 4 Active Problems Location of Pain Severity and Description of Pain Patient Has Paino No Site Locations Pain Management and Medication Current Pain Management: Electronic  Signature(s) Signed: 01/09/2020 5:51:27 PM By: Carlene Coria RN Entered By: Carlene Coria on 01/09/2020 08:19:15 -------------------------------------------------------------------------------- Patient/Caregiver Education Details Patient Name: Date of Service: [name], [place]. 11/8/2021andnbsp8:15 [place] Record Number: [id number] Patient Account Number: [id number] Date of Birth/Gender: Treating RN: 11-20-1970 (49 y.o. Janyth Contes Primary Care Physician: [name] Other Clinician: Referring Physician: Treating Physician/Extender: Marin Shutter in Treatment: 4 Education Assessment Education Provided To: Patient Education Topics Provided Wound/Skin Impairment: Methods: Explain/Verbal Responses: State content correctly Electronic Signature(s) Signed: 01/09/2020 6:16:40 PM By: Levan Hurst RN, BSN Entered By: Levan Hurst on 01/09/2020 08:31:24 -------------------------------------------------------------------------------- Wound Assessment Details Patient Name: Date of Service: [place] [name], [name] 01/09/2020 8:15 A M Medical Record Number: [id number] Patient Account Number: [id number] Date of Birth/Sex: Treating RN: 02-04-1971 (49 y.o. Oval Linsey Primary Care Ioana Louks: [name] Other Clinician: Referring Makaveli Hoard: Treating Evalyne Cortopassi/Extender: Marin Shutter in Treatment: 4 Wound Status Wound Number: 3 Primary Etiology: Abrasion Wound Location: Right Flank Wound Status: Open Wounding Event: Trauma Comorbid History: Paraplegia Date Acquired: 11/20/2019 Weeks Of Treatment: 4 Clustered Wound: No Wound Measurements Length: (cm) 0.9 Width: (cm) 3.4 Depth: (cm) 2.5 Area: (cm) 2.403 Volume: (cm) 6.008 % Reduction in Area: 85.8% % Reduction in Volume:  80.3% Epithelialization: Small (1-33%) Tunneling: No Undermining: No Wound Description Classification: Full Thickness Without Exposed Support  Structures Wound Margin: Distinct, outline attached Exudate Amount: Medium Exudate Type: Purulent Exudate Color: yellow, brown, green Foul Odor After Cleansing: No Slough/Fibrino Yes Wound Bed Granulation Amount: Large (67-100%) Exposed Structure Granulation Quality: Red Fascia Exposed: No Necrotic Amount: Small (1-33%) Fat Layer (Subcutaneous Tissue) Exposed: Yes Necrotic Quality: Adherent Slough Tendon Exposed: No Muscle Exposed: No Joint Exposed: No Bone Exposed: No Assessment Notes induration to periwound Treatment Notes Wound #3 (Right Flank) 1. Cleanse With Wound Cleanser 2. Periwound Care Skin Prep 3. Primary Dressing Applied Calcium Alginate Ag 4. Secondary Dressing ABD Pad Dry Gauze 5. Secured With [employer]) Signed: 01/09/2020 5:51:27 PM By: Carlene Coria RN Entered By: Carlene Coria on 01/09/2020 08:44:44 -------------------------------------------------------------------------------- Wound Assessment Details Patient Name: Date of Service: [name], [name] 01/09/2020 8:15 A M Medical Record Number: [id number] Patient Account Number: [id number] Date of Birth/Sex: Treating RN: [birth year] (49 y.o. Jerilynn Mages) Carlene Coria Primary Care Margan Elias: [name] Other Clinician: Referring Anushka Hartinger: Treating Takyia Sindt/Extender: Marin Shutter in Treatment: 4 Wound Status Wound Number: 4 Primary Etiology: Pressure Ulcer Wound Location: Left Trochanter Wound Status: Open Wounding Event: Pressure Injury Comorbid History: Paraplegia Date Acquired: 10/02/2019 Weeks Of Treatment: 4 Clustered Wound: No Wound Measurements Length: (cm) 2 Width: (cm) 0.5 Depth: (cm) 0.3 Area: (cm) 0.785 Volume: (cm) 0.236 % Reduction in Area: 25.9% % Reduction in Volume: -122.6% Epithelialization: Small (1-33%) Tunneling: No Undermining: No Wound Description Classification: Category/Stage IV Wound Margin: Thickened Exudate Amount:  Small Exudate Type: Serous Exudate Color: amber Foul Odor After Cleansing: No Slough/Fibrino No Wound Bed Granulation Amount: Large (67-100%) Exposed Structure Granulation Quality: Pink Fascia Exposed: No Necrotic Amount: None Present (0%) Fat Layer (Subcutaneous Tissue) Exposed: Yes Tendon Exposed: No Muscle Exposed: No Joint Exposed: No Bone Exposed: No Treatment Notes Wound #4 (Left Trochanter) 1. Cleanse With Wound Cleanser 2. Periwound Care Skin Prep 3. Primary Dressing Applied Calcium Alginate Ag 4. Secondary Dressing Foam Border Dressing 5. Secured With [employer]) Signed: 01/09/2020 5:51:27 PM By: Carlene Coria RN Entered By: Carlene Coria on 01/09/2020 08:35:18 -------------------------------------------------------------------------------- Vitals Details Patient Name: Date of Service: [name], [name] 01/09/2020 8:15 A M Medical Record Number: [id number] Patient Account Number: [id number] Date of Birth/Sex: Treating RN: [birth year] (49 y.o. Jerilynn Mages) Carlene Coria Primary Care Mahira Petras: [name] Other Clinician: Referring Chenita Ruda: Treating Denica Web/Extender: Marin Shutter in Treatment: 4 Vital Signs Time Taken: 08:18 Temperature (F): 98 Height (in): 79 Pulse (bpm): 73 Weight (lbs): 325 Respiratory Rate (breaths/min): 18 Body Mass Index (BMI): 36.6 Blood Pressure (mmHg): 156/84 Reference Range: 80 - 120 mg / dl Electronic Signature(s) Signed: 01/09/2020 5:51:27 PM By: Carlene Coria RN Entered By: Carlene Coria on 01/09/2020 08:19:08
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
## 2020-01-11 DIAGNOSIS — G8221 Paraplegia, complete: Secondary | ICD-10-CM | POA: Diagnosis not present

## 2020-01-11 DIAGNOSIS — Z960 Presence of urogenital implants: Secondary | ICD-10-CM | POA: Diagnosis not present

## 2020-01-11 DIAGNOSIS — L03319 Cellulitis of trunk, unspecified: Secondary | ICD-10-CM | POA: Diagnosis not present

## 2020-01-11 DIAGNOSIS — L89224 Pressure ulcer of left hip, stage 4: Secondary | ICD-10-CM | POA: Diagnosis not present

## 2020-01-11 DIAGNOSIS — S31000D Unspecified open wound of lower back and pelvis without penetration into retroperitoneum, subsequent encounter: Secondary | ICD-10-CM | POA: Diagnosis not present

## 2020-01-13 DIAGNOSIS — L89224 Pressure ulcer of left hip, stage 4: Secondary | ICD-10-CM | POA: Diagnosis not present

## 2020-01-13 DIAGNOSIS — G8221 Paraplegia, complete: Secondary | ICD-10-CM | POA: Diagnosis not present

## 2020-01-13 DIAGNOSIS — S31000D Unspecified open wound of lower back and pelvis without penetration into retroperitoneum, subsequent encounter: Secondary | ICD-10-CM | POA: Diagnosis not present

## 2020-01-13 DIAGNOSIS — Z960 Presence of urogenital implants: Secondary | ICD-10-CM | POA: Diagnosis not present

## 2020-01-13 DIAGNOSIS — L03319 Cellulitis of trunk, unspecified: Secondary | ICD-10-CM | POA: Diagnosis not present

## 2020-01-16 ENCOUNTER — Encounter (HOSPITAL_BASED_OUTPATIENT_CLINIC_OR_DEPARTMENT_OTHER): Payer: Medicare Other | Admitting: Internal Medicine

## 2020-01-16 DIAGNOSIS — G8221 Paraplegia, complete: Secondary | ICD-10-CM | POA: Diagnosis not present

## 2020-01-16 DIAGNOSIS — L03319 Cellulitis of trunk, unspecified: Secondary | ICD-10-CM | POA: Diagnosis not present

## 2020-01-16 DIAGNOSIS — Z960 Presence of urogenital implants: Secondary | ICD-10-CM | POA: Diagnosis not present

## 2020-01-16 DIAGNOSIS — S31000D Unspecified open wound of lower back and pelvis without penetration into retroperitoneum, subsequent encounter: Secondary | ICD-10-CM | POA: Diagnosis not present

## 2020-01-16 DIAGNOSIS — L89224 Pressure ulcer of left hip, stage 4: Secondary | ICD-10-CM | POA: Diagnosis not present

## 2020-01-18 DIAGNOSIS — L03319 Cellulitis of trunk, unspecified: Secondary | ICD-10-CM | POA: Diagnosis not present

## 2020-01-18 DIAGNOSIS — Z960 Presence of urogenital implants: Secondary | ICD-10-CM | POA: Diagnosis not present

## 2020-01-18 DIAGNOSIS — S31000D Unspecified open wound of lower back and pelvis without penetration into retroperitoneum, subsequent encounter: Secondary | ICD-10-CM | POA: Diagnosis not present

## 2020-01-18 DIAGNOSIS — L89224 Pressure ulcer of left hip, stage 4: Secondary | ICD-10-CM | POA: Diagnosis not present

## 2020-01-18 DIAGNOSIS — G8221 Paraplegia, complete: Secondary | ICD-10-CM | POA: Diagnosis not present

## 2020-01-20 DIAGNOSIS — Z960 Presence of urogenital implants: Secondary | ICD-10-CM | POA: Diagnosis not present

## 2020-01-20 DIAGNOSIS — L03319 Cellulitis of trunk, unspecified: Secondary | ICD-10-CM | POA: Diagnosis not present

## 2020-01-20 DIAGNOSIS — L89224 Pressure ulcer of left hip, stage 4: Secondary | ICD-10-CM | POA: Diagnosis not present

## 2020-01-20 DIAGNOSIS — G8221 Paraplegia, complete: Secondary | ICD-10-CM | POA: Diagnosis not present

## 2020-01-20 DIAGNOSIS — S31000D Unspecified open wound of lower back and pelvis without penetration into retroperitoneum, subsequent encounter: Secondary | ICD-10-CM | POA: Diagnosis not present

## 2020-01-23 ENCOUNTER — Encounter (HOSPITAL_BASED_OUTPATIENT_CLINIC_OR_DEPARTMENT_OTHER): Payer: Medicare Other | Admitting: Physician Assistant

## 2020-01-23 ENCOUNTER — Other Ambulatory Visit (HOSPITAL_COMMUNITY)
Admission: RE | Admit: 2020-01-23 | Discharge: 2020-01-23 | Disposition: A | Discharge status: Home / Self Care | Payer: Medicare Other | Source: Other Acute Inpatient Hospital | Attending: Physician Assistant | Admitting: Physician Assistant

## 2020-01-23 ENCOUNTER — Other Ambulatory Visit: Payer: Self-pay

## 2020-01-23 DIAGNOSIS — B9561 Methicillin susceptible Staphylococcus aureus infection as the cause of diseases classified elsewhere: Secondary | ICD-10-CM | POA: Diagnosis not present

## 2020-01-23 DIAGNOSIS — L98423 Non-pressure chronic ulcer of back with necrosis of muscle: Secondary | ICD-10-CM | POA: Insufficient documentation

## 2020-01-23 DIAGNOSIS — B964 Proteus (mirabilis) (morganii) as the cause of diseases classified elsewhere: Secondary | ICD-10-CM | POA: Diagnosis not present

## 2020-01-23 DIAGNOSIS — L89224 Pressure ulcer of left hip, stage 4: Secondary | ICD-10-CM | POA: Diagnosis not present

## 2020-01-23 DIAGNOSIS — G8221 Paraplegia, complete: Secondary | ICD-10-CM | POA: Diagnosis not present

## 2020-01-23 DIAGNOSIS — Z1610 Resistance to unspecified beta lactam antibiotics: Secondary | ICD-10-CM | POA: Diagnosis not present

## 2020-01-23 NOTE — Progress Notes (Addendum)
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

## 2020-01-23 NOTE — Progress Notes (Signed)
[name], [name] ([id number]) Visit Report for 01/23/2020 Arrival Information Details Patient Name: Date of Service: [place], [place] [name] 01/23/2020 8:00 [name] Record Number: [id number] Patient Account Number: [id number] Date of Birth/Sex: Treating RN: 11-18-1970 (49 y.o. [name], [name] Primary Care Wave Calzada: [name] Other Clinician: Referring Caiya Bettes: Treating Isauro Skelley/Extender: Rhae Hammock in Treatment: 6 Visit Information History Since Last Visit Added or deleted any medications: No Patient Arrived: Wheel Chair Any new allergies or adverse reactions: No Arrival Time: 08:31 Had a fall or experienced change in No Accompanied By: granddgt activities of daily living that may affect Transfer Assistance: None risk of falls: Patient Identification Verified: Yes Signs or symptoms of abuse/neglect since last visito No Secondary Verification Process Completed: Yes Hospitalized since last visit: No Patient Requires Transmission-Based Precautions: No Implantable device outside of the clinic excluding No Patient Has Alerts: No cellular tissue based products placed in the center since last visit: Has Dressing in Place as Prescribed: Yes Pain Present Now: No Electronic Signature(s) Signed: 01/23/2020 4:59:01 PM By: Baruch Gouty RN, BSN Entered By: Baruch Gouty on 01/23/2020 08:33:18 -------------------------------------------------------------------------------- Encounter Discharge Information Details Patient Name: Date of Service: [name], [place] [name] 01/23/2020 8:00 A M Medical Record Number: [id number] Patient Account Number: [id number] Date of Birth/Sex: Treating RN: 30-Sep-1970 (49 y.o. Hessie Diener Primary Care Eliette Drumwright: [name] Other Clinician: Referring Anandi Abramo: Treating Amesha Bailey/Extender: Johnna Acosta Weeks in Treatment: 6 Encounter Discharge Information Items Post Procedure Vitals Discharge Condition:  Stable Temperature (F): 97.8 Ambulatory Status: Wheelchair Pulse (bpm): 105 Discharge Destination: Home Respiratory Rate (breaths/min): 18 Transportation: Private Auto Blood Pressure (mmHg): 154/92 Accompanied By: self Schedule Follow-up Appointment: Yes Clinical Summary of Care: Electronic Signature(s) Signed: 01/23/2020 4:33:25 PM By: Deon Pilling Entered By: Deon Pilling on 01/23/2020 09:18:07 -------------------------------------------------------------------------------- Lower Extremity Assessment Details Patient Name: Date of Service: [name], [name] [name] 01/23/2020 8:00 A M Medical Record Number: [id number] Patient Account Number: [id number] Date of Birth/Sex: Treating RN: 06/16/1970 (49 y.o. Ernestene Mention Primary Care Megha Agnes: [name] Other Clinician: Referring Aneira Cavitt: Treating Ellese Julius/Extender: Johnna Acosta Weeks in Treatment: 6 Electronic Signature(s) Signed: 01/23/2020 4:59:01 PM By: Baruch Gouty RN, BSN Entered By: Baruch Gouty on 01/23/2020 08:35:21 -------------------------------------------------------------------------------- Multi-Disciplinary Care Plan Details Patient Name: Date of Service: [name], [place] [name] 01/23/2020 8:00 A M Medical Record Number: [id number] Patient Account Number: [id number] Date of Birth/Sex: Treating RN: Nov 12, 1970 (49 y.o. [name] Primary Care Breonia Kirstein: [name] Other Clinician: Referring Maitri Schnoebelen: Treating Niko Penson/Extender: Johnna Acosta Weeks in Treatment: 6 Active Inactive Wound/Skin Impairment Nursing Diagnoses: Knowledge deficit related to ulceration/compromised skin integrity Goals: Patient/caregiver will verbalize understanding of skin care regimen Date Initiated: 12/06/2019 Target Resolution Date: 02/10/2020 Goal Status: Active Ulcer/skin breakdown will have a volume reduction of 30% by week 4 Date Initiated: 12/06/2019 Date Inactivated:  01/09/2020 Target Resolution Date: 01/13/2020 Goal Status: Met Ulcer/skin breakdown will have a volume reduction of 50% by week 8 Date Initiated: 01/09/2020 Target Resolution Date: 02/10/2020 Goal Status: Active Interventions: Assess patient/caregiver ability to obtain necessary supplies Assess patient/caregiver ability to perform ulcer/skin care regimen upon admission and as needed Assess ulceration(s) every visit Notes: Electronic Signature(s) Signed: 01/23/2020 5:46:54 PM By: Levan Hurst RN, BSN Entered By: Levan Hurst on 01/23/2020 08:45:03 -------------------------------------------------------------------------------- Pain Assessment Details Patient Name: Date of Service: [name], [name] 01/23/2020 8:00 [place] Record Number: [id number] Patient Account  Number: [id number] Date of Birth/Sex: Treating RN: 14-Jan-1971 (49 y.o. Ernestene Mention Primary Care Adysen Raphael: [name] Other Clinician: Referring Kaylyne Axton: Treating Falyn Rubel/Extender: Johnna Acosta Weeks in Treatment: 6 Active Problems Location of Pain Severity and Description of Pain Patient Has Paino No Site Locations Rate the pain. Current Pain Level: 0 Pain Management and Medication Current Pain Management: Electronic Signature(s) Signed: 01/23/2020 4:59:01 PM By: Baruch Gouty RN, BSN Entered By: Baruch Gouty on 01/23/2020 08:35:13 -------------------------------------------------------------------------------- Patient/Caregiver Education Details Patient Name: Date of Service: [name] Judeen Hammans, [place] 11/22/2021andnbsp8:00 A M Medical Record Number: [id number] Patient Account Number: [id number] Date of Birth/Gender: Treating RN: Jul 25, 1970 (49 y.o. [name] Primary Care Physician: [name] Other Clinician: Referring Physician: Treating Physician/Extender: Rhae Hammock in Treatment: 6 Education Assessment Education Provided  To: Patient Education Topics Provided Wound/Skin Impairment: Methods: Explain/Verbal Responses: State content correctly Electronic Signature(s) Signed: 01/23/2020 5:46:54 PM By: Levan Hurst RN, BSN Entered By: Levan Hurst on 01/23/2020 09:15:12 -------------------------------------------------------------------------------- Wound Assessment Details Patient Name: Date of Service: [name], [place] [name] 01/23/2020 8:00 A M Medical Record Number: [id number] Patient Account Number: [id number] Date of Birth/Sex: Treating RN: 1970/07/31 (49 y.o. Ernestene Mention Primary Care Madgeline Rayo: [name] Other Clinician: Referring Shayma Pfefferle: Treating Roneshia Drew/Extender: Johnna Acosta Weeks in Treatment: 6 Wound Status Wound Number: 3 Primary Etiology: Abrasion Wound Location: Right Flank Wound Status: Open Wounding Event: Trauma Comorbid History: Paraplegia Date Acquired: 11/20/2019 Weeks Of Treatment: 6 Clustered Wound: No Wound Measurements Length: (cm) 0.8 Width: (cm) 1.4 Depth: (cm) 2.6 Area: (cm) 0.88 Volume: (cm) 2.287 % Reduction in Area: 94.8% % Reduction in Volume: 92.5% Epithelialization: Small (1-33%) Tunneling: Yes Position (o'clock): 12 Maximum Distance: (cm) 3.6 Undermining: No Wound Description Classification: Full Thickness Without Exposed Support Structures Wound Margin: Flat and Intact Exudate Amount: Medium Exudate Type: Purulent Exudate Color: yellow, brown, green Foul Odor After Cleansing: No Slough/Fibrino Yes Wound Bed Granulation Amount: Large (67-100%) Exposed Structure Granulation Quality: Red, Hyper-granulation, Friable Fascia Exposed: No Necrotic Amount: None Present (0%) Fat Layer (Subcutaneous Tissue) Exposed: Yes Tendon Exposed: No Muscle Exposed: No Joint Exposed: No Bone Exposed: No Treatment Notes Wound #3 (Right Flank) 1. Cleanse With Wound Cleanser 2. Periwound Care Skin Prep 3. Primary Dressing  Applied Packing strip 4. Secondary Dressing Foam Border Dressing 5. Secured With Self Adhesive Bandage Notes iodoform packing strip as primary dressing to right flank. Electronic Signature(s) Signed: 01/23/2020 4:59:01 PM By: Baruch Gouty RN, BSN Entered By: Baruch Gouty on 01/23/2020 08:42:56 -------------------------------------------------------------------------------- Wound Assessment Details Patient Name: Date of Service: [name], [place] [name] 01/23/2020 8:00 A M Medical Record Number: [id number] Patient Account Number: [id number] Date of Birth/Sex: Treating RN: 1971/02/20 (49 y.o. Ernestene Mention Primary Care Gratia Disla: [name] Other Clinician: Referring Peri Kreft: Treating Sricharan Lacomb/Extender: Johnna Acosta Weeks in Treatment: 6 Wound Status Wound Number: 4 Primary Etiology: Pressure Ulcer Wound Location: Left Trochanter Wound Status: Open Wounding Event: Pressure Injury Comorbid History: Paraplegia Date Acquired: 10/02/2019 Weeks Of Treatment: 6 Clustered Wound: No Wound Measurements Length: (cm) 0.4 Width: (cm) 0.1 Depth: (cm) 0.1 Area: (cm) 0.031 Volume: (cm) 0.003 % Reduction in Area: 97.1% % Reduction in Volume: 97.2% Epithelialization: Large (67-100%) Tunneling: No Undermining: No Wound Description Classification: Category/Stage IV Wound Margin: Thickened Exudate Amount: None Present Foul Odor After Cleansing: No Slough/Fibrino No Wound Bed Granulation Amount: Small (1-33%) Exposed Structure Granulation Quality: Pink, Pale Fascia Exposed: No Necrotic Amount: None  Present (0%) Fat Layer (Subcutaneous Tissue) Exposed: No Tendon Exposed: No Muscle Exposed: No Joint Exposed: No Bone Exposed: No Treatment Notes Wound #4 (Left Trochanter) 1. Cleanse With Wound Cleanser 2. Periwound Care Skin Prep 3. Primary Dressing Applied Calcium Alginate Ag 4. Secondary Dressing Foam Border Dressing 5. Secured With Administrator) Signed: 01/23/2020 4:59:01 PM By: Baruch Gouty RN, BSN Entered By: Baruch Gouty on 01/23/2020 08:45:05 -------------------------------------------------------------------------------- Vitals Details Patient Name: Date of Service: [name], [place] [name] 01/23/2020 8:00 A M Medical Record Number: [id number] Patient Account Number: [id number] Date of Birth/Sex: Treating RN: Dec 24, 1970 (49 y.o. Ernestene Mention Primary Care Mickala Laton: [name] Other Clinician: Referring Draiden Mirsky: Treating Grover Woodfield/Extender: Johnna Acosta Weeks in Treatment: 6 Vital Signs Time Taken: 08:34 Temperature (F): 97.8 Height (in): 79 Pulse (bpm): 105 Source: Stated Respiratory Rate (breaths/min): 18 Weight (lbs): 325 Blood Pressure (mmHg): 154/92 Source: Stated Reference Range: 80 - 120 mg / dl Body Mass Index (BMI): 36.6 Electronic Signature(s) Signed: 01/23/2020 4:59:01 PM By: Baruch Gouty RN, BSN Entered By: Baruch Gouty on 01/23/2020 08:35:05

## 2020-01-24 ENCOUNTER — Other Ambulatory Visit: Payer: Self-pay | Admitting: Physician Assistant

## 2020-01-24 DIAGNOSIS — L0291 Cutaneous abscess, unspecified: Secondary | ICD-10-CM

## 2020-01-25 DIAGNOSIS — Z960 Presence of urogenital implants: Secondary | ICD-10-CM | POA: Diagnosis not present

## 2020-01-25 DIAGNOSIS — G8221 Paraplegia, complete: Secondary | ICD-10-CM | POA: Diagnosis not present

## 2020-01-25 DIAGNOSIS — L03319 Cellulitis of trunk, unspecified: Secondary | ICD-10-CM | POA: Diagnosis not present

## 2020-01-25 DIAGNOSIS — S31000D Unspecified open wound of lower back and pelvis without penetration into retroperitoneum, subsequent encounter: Secondary | ICD-10-CM | POA: Diagnosis not present

## 2020-01-25 DIAGNOSIS — L89224 Pressure ulcer of left hip, stage 4: Secondary | ICD-10-CM | POA: Diagnosis not present

## 2020-01-27 LAB — AEROBIC CULTURE W GRAM STAIN (SUPERFICIAL SPECIMEN)

## 2020-01-28 DIAGNOSIS — L03319 Cellulitis of trunk, unspecified: Secondary | ICD-10-CM | POA: Diagnosis not present

## 2020-01-28 DIAGNOSIS — G8221 Paraplegia, complete: Secondary | ICD-10-CM | POA: Diagnosis not present

## 2020-01-28 DIAGNOSIS — Z960 Presence of urogenital implants: Secondary | ICD-10-CM | POA: Diagnosis not present

## 2020-01-28 DIAGNOSIS — L89224 Pressure ulcer of left hip, stage 4: Secondary | ICD-10-CM | POA: Diagnosis not present

## 2020-01-28 DIAGNOSIS — S31000D Unspecified open wound of lower back and pelvis without penetration into retroperitoneum, subsequent encounter: Secondary | ICD-10-CM | POA: Diagnosis not present

## 2020-01-30 DIAGNOSIS — Z960 Presence of urogenital implants: Secondary | ICD-10-CM | POA: Diagnosis not present

## 2020-01-30 DIAGNOSIS — G8221 Paraplegia, complete: Secondary | ICD-10-CM | POA: Diagnosis not present

## 2020-01-30 DIAGNOSIS — S31000D Unspecified open wound of lower back and pelvis without penetration into retroperitoneum, subsequent encounter: Secondary | ICD-10-CM | POA: Diagnosis not present

## 2020-01-30 DIAGNOSIS — L89224 Pressure ulcer of left hip, stage 4: Secondary | ICD-10-CM | POA: Diagnosis not present

## 2020-01-30 DIAGNOSIS — L03319 Cellulitis of trunk, unspecified: Secondary | ICD-10-CM | POA: Diagnosis not present

## 2020-01-31 ENCOUNTER — Other Ambulatory Visit: Payer: Self-pay

## 2020-01-31 ENCOUNTER — Encounter (HOSPITAL_BASED_OUTPATIENT_CLINIC_OR_DEPARTMENT_OTHER): Payer: Medicare Other | Admitting: Internal Medicine

## 2020-01-31 DIAGNOSIS — B964 Proteus (mirabilis) (morganii) as the cause of diseases classified elsewhere: Secondary | ICD-10-CM | POA: Diagnosis not present

## 2020-01-31 DIAGNOSIS — L98423 Non-pressure chronic ulcer of back with necrosis of muscle: Secondary | ICD-10-CM | POA: Diagnosis not present

## 2020-01-31 DIAGNOSIS — L89224 Pressure ulcer of left hip, stage 4: Secondary | ICD-10-CM | POA: Diagnosis not present

## 2020-01-31 DIAGNOSIS — B9561 Methicillin susceptible Staphylococcus aureus infection as the cause of diseases classified elsewhere: Secondary | ICD-10-CM | POA: Diagnosis not present

## 2020-01-31 DIAGNOSIS — Z1610 Resistance to unspecified beta lactam antibiotics: Secondary | ICD-10-CM | POA: Diagnosis not present

## 2020-01-31 DIAGNOSIS — G8221 Paraplegia, complete: Secondary | ICD-10-CM | POA: Diagnosis not present

## 2020-01-31 NOTE — Progress Notes (Signed)
[name] ([id number]) Visit Report for 01/31/2020 Arrival Information Details Patient Name: Date of Service: [name], [name]. 01/31/2020 8:00 [name] Record Number: [id number] Patient Account Number: [id number] Date of Birth/Sex: Treating RN: May 21, 1970 (49 y.o. [name], [name] Primary Care [name]: [name] Other Clinician: Referring [name]: Treating [name] in Treatment: 8 Visit Information History Since Last Visit Added or deleted any medications: No Patient Arrived: Wheel Chair Any new allergies or adverse reactions: No Arrival Time: 08:07 Had a fall or experienced change in No Accompanied By: self activities of daily living that may affect Transfer Assistance: None risk of falls: Patient Identification Verified: Yes Signs or symptoms of abuse/neglect since last visito No Secondary Verification Process Completed: Yes Hospitalized since last visit: No Patient Requires Transmission-Based Precautions: No Implantable device outside of the clinic excluding No Patient Has Alerts: No cellular tissue based products placed in the center since last visit: Has Dressing in Place as Prescribed: Yes Pain Present Now: No Electronic Signature(s) Signed: 01/31/2020 5:54:32 PM By: [name] Entered By: [name] on 01/31/2020 08:20:43 -------------------------------------------------------------------------------- Clinic Level of Care Assessment Details Patient Name: Date of Service: [place], [place] [name] 01/31/2020 8:00 A M Medical Record Number: [id number] Patient Account Number: [id number] Date of Birth/Sex: Treating RN: Aug 15, 1970 (49 y.o. [name]) [name] Primary Care [name]: [name] Other Clinician: Referring Zuha Dejonge: Treating [name]/Extender: [name] in Treatment: 8 Clinic Level of Care Assessment Items TOOL 4 Quantity Score X- 1 0 Use when only an EandM is  performed on FOLLOW-UP visit ASSESSMENTS - Nursing Assessment / Reassessment X- 1 10 Reassessment of Co-morbidities (includes updates in patient status) X- 1 5 Reassessment of Adherence to Treatment Plan ASSESSMENTS - Wound and Skin A ssessment / Reassessment _0  - 0 Simple Wound Assessment / Reassessment - one wound X- 2 5 Complex Wound Assessment / Reassessment - multiple wounds _1  - 0 Dermatologic / Skin Assessment (not related to wound area) ASSESSMENTS - Focused Assessment _2  - 0 Circumferential Edema Measurements - multi extremities _3  - 0 Nutritional Assessment / Counseling / Intervention _4  - 0 Lower Extremity Assessment (monofilament, tuning fork, pulses) _5  - 0 Peripheral Arterial Disease Assessment (using hand held doppler) ASSESSMENTS - Ostomy and/or Continence Assessment and Care _6  - 0 Incontinence Assessment and Management _7  - 0 Ostomy Care Assessment and Management (repouching, etc.) PROCESS - Coordination of Care X - Simple Patient / Family Education for ongoing care 1 15 _8  - 0 Complex (extensive) Patient / Family Education for ongoing care X- 1 10 Staff obtains Programmer, systems, Records, T Results / Process Orders est _9  - 0 Staff telephones HHA, Nursing Homes / Clarify orders / etc _10  - 0 Routine Transfer to another Facility (non-emergent condition) _11  - 0 Routine Hospital Admission (non-emergent condition) _12  - 0 New Admissions / Biomedical engineer / Ordering NPWT Apligraf, etc. , _13  - 0 Emergency Hospital Admission (emergent condition) X- 1 10 Simple Discharge Coordination _14  - 0 Complex (extensive) Discharge Coordination PROCESS - Special Needs _15  - 0 Pediatric / Minor Patient Management _16  - 0 Isolation Patient Management _17  - 0 Hearing / Language / Visual special needs _18  - 0 Assessment of Community assistance (transportation, D/C planning, etc.) _19  - 0 Additional assistance / Altered mentation _20  - 0 Support Surface(s) Assessment  (bed, cushion, seat, etc.) INTERVENTIONS - Wound Cleansing / Measurement _21  - 0 Simple Wound Cleansing - one wound X- 2 5 Complex Wound Cleansing -  multiple wounds X- 1 5 Wound Imaging (photographs - any number of wounds) _0  - 0 Wound Tracing (instead of photographs) _1  - 0 Simple Wound Measurement - one wound X- 2 5 Complex Wound Measurement - multiple wounds INTERVENTIONS - Wound Dressings X - Small Wound Dressing one or multiple wounds 2 10 _2  - 0 Medium Wound Dressing one or multiple wounds _3  - 0 Large Wound Dressing one or multiple wounds X- 1 5 Application of Medications - topical <[id number]>_3<\/[id number]>_0  - 0 Application of Medications - injection INTERVENTIONS - Miscellaneous _5  - 0 External ear exam _6  - 0 Specimen Collection (cultures, biopsies, blood, body fluids, etc.) _7  - 0 Specimen(s) / Culture(s) sent or taken to Lab for analysis _8  - 0 Patient Transfer (multiple staff / Civil Service fast streamer / Similar devices) _9  - 0 Simple Staple / Suture removal (25 or less) _10  - 0 Complex Staple / Suture removal (26 or more) _11  - 0 Hypo / Hyperglycemic Management (close monitor of Blood Glucose) _12  - 0 Ankle / Brachial Index (ABI) - do not check if billed separately X- 1 5 Vital Signs Has the patient been seen at the hospital within the last three years: Yes Total Score: 115 Level Of Care: New/Established - Level 3 Electronic Signature(s) Signed: 01/31/2020 5:56:00 PM By: [name] Coria RN Entered By: [name] on 01/31/2020 08:38:59 -------------------------------------------------------------------------------- Encounter Discharge Information Details Patient Name: Date of Service: [name] 01/31/2020 8:00 A M Medical Record Number: [id number] Patient Account Number: [id number] Date of Birth/Sex: Treating RN: January 03, 1971 (49 y.o. [name] Primary Care Minsa Weddington: [name] Other Clinician: Referring Phung Kotas: Treating Adonias Demore/Extender: [name] in Treatment: 8 Encounter Discharge Information Items Discharge Condition: Stable Ambulatory Status: Wheelchair Discharge Destination: Home Transportation: Private Auto Accompanied By: self Schedule Follow-up Appointment: Yes Clinical Summary of Care: Electronic Signature(s) Signed: 01/31/2020 5:54:32 PM By: [name] Entered By: [name] on 01/31/2020 08:52:20 -------------------------------------------------------------------------------- Lower Extremity Assessment Details Patient Name: Date of Service: [name], [place]. 01/31/2020 8:00 A M Medical Record Number: [id number] Patient Account Number: [id number] Date of Birth/Sex: Treating RN: 1970-03-25 (49 y.o. [name] Primary Care Ananya Mccleese: [name] Other Clinician: Referring Gladies Sofranko: Treating Jackston [place]/Extender: [name] in Treatment: 8 Electronic Signature(s) Signed: 01/31/2020 5:54:32 PM By: [name] Entered By: [name] on 01/31/2020 08:21:05 -------------------------------------------------------------------------------- Multi Wound Chart Details Patient Name: Date of Service: [place], [place] L. 01/31/2020 8:00 A M Medical Record Number: [id number] Patient Account Number: [id number] Date of Birth/Sex: Treating RN: 1970/05/04 (49 y.o. [name]) [name] Primary Care [name]: [name] Other Clinician: Referring Sumeet Geter: Treating Raphaela Cannaday/Extender: [name] in Treatment: 8 Vital Signs Height(in): 79 Pulse(bpm): 87 Weight(lbs): 325 Blood Pressure(mmHg): 134/82 Body Mass Index(BMI): 37 Temperature(F): 98 Respiratory Rate(breaths/min): 20 Photos: [3:No Photos Right Flank] [4:No Photos Left Trochanter] [N/A:N/A N/A] Wound Location: [3:Trauma] [4:Pressure Injury] [N/A:N/A] Wounding Event: [3:Abrasion] [4:Pressure Ulcer] [N/A:N/A] Primary Etiology: [3:Paraplegia] [4:Paraplegia] [N/A:N/A] Comorbid History:  [3:11/20/2019] [4:10/02/2019] [N/A:N/A] Date Acquired: [3:8] [4:8] [N/A:N/A] Weeks of Treatment: [3:Open] [4:Open] [N/A:N/A] Wound Status: [3:0.3x0.7x2.4] [4:0.2x0.2x0.1] [N/A:N/A] Measurements L x W x D (cm) [3:0.165] [4:0.031] [N/A:N/A] A (cm) : rea [3:0.396] [4:0.003] [N/A:N/A] Volume (cm) : [3:99.00%] [4:97.10%] [N/A:N/A] % Reduction in A rea: [3:98.70%] [4:97.20%] [N/A:N/A] % Reduction in Volume: [3:12] Position 1 (o'clock): [3:3.4] Maximum Distance 1 (cm): [3:Yes] [4:No] [N/A:N/A] Tunneling: [3:Full Thickness Without Exposed] [4:Category/Stage IV] [N/A:N/A] Classification: [3:Support Structures Large] [4:None Present] [N/A:N/A] Exudate Amount: [3:Purulent] [  4:N/A] [N/A:N/A] Exudate Type: [3:yellow, brown, green] [4:N/A] [N/A:N/A] Exudate Color: [3:Flat and Intact] [4:Thickened] [N/A:N/A] Wound Margin: [3:Large (67-100%)] [4:Small (1-33%)] [N/A:N/A] Granulation Amount: [3:Red] [4:Pink, Pale] [N/A:N/A] Granulation Quality: [3:None Present (0%)] [4:None Present (0%)] [N/A:N/A] Necrotic Amount: [3:Fat Layer (Subcutaneous Tissue): Yes Fascia: No] [N/A:N/A] Exposed Structures: [3:Fascia: No Tendon: No Muscle: No Joint: No Bone: No Small (1-33%)] [4:Fat Layer (Subcutaneous Tissue): No Tendon: No Muscle: No Joint: No Bone: No Large (67-100%)] [N/A:N/A] Treatment Notes Electronic Signature(s) Signed: 01/31/2020 5:54:56 PM By: Linton Ham MD Signed: 01/31/2020 5:56:00 PM By: [name] Coria RN Entered By: Linton Ham on 01/31/2020 08:45:03 -------------------------------------------------------------------------------- Multi-Disciplinary Care Plan Details Patient Name: Date of Service: [place], [name] 01/31/2020 8:00 A M Medical Record Number: [id number] Patient Account Number: [id number] Date of Birth/Sex: Treating RN: Jan 06, 1971 (49 y.o. [name] Primary Care Cleto Claggett: [name] Other Clinician: Referring Kindred Reidinger: Treating Henley Boettner/Extender: [name] in Treatment: 8 Active Inactive Wound/Skin Impairment Nursing Diagnoses: Knowledge deficit related to ulceration/compromised skin integrity Goals: Patient/caregiver will verbalize understanding of skin care regimen Date Initiated: 12/06/2019 Target Resolution Date: 02/10/2020 Goal Status: Active Ulcer/skin breakdown will have a volume reduction of 30% by week 4 Date Initiated: 12/06/2019 Date Inactivated: 01/09/2020 Target Resolution Date: 01/13/2020 Goal Status: Met Ulcer/skin breakdown will have a volume reduction of 50% by week 8 Date Initiated: 01/09/2020 Target Resolution Date: 02/10/2020 Goal Status: Active Interventions: Assess patient/caregiver ability to obtain necessary supplies Assess patient/caregiver ability to perform ulcer/skin care regimen upon admission and as needed Assess ulceration(s) every visit Notes: Electronic Signature(s) Signed: 01/31/2020 5:56:00 PM By: [name] Coria RN Entered By: [name] on 01/31/2020 08:13:54 -------------------------------------------------------------------------------- Pain Assessment Details Patient Name: Date of Service: [name] 01/31/2020 8:00 A M Medical Record Number: [id number] Patient Account Number: [id number] Date of Birth/Sex: Treating RN: Apr 25, 1970 (49 y.o. [name] Primary Care Deverick Pruss: [name] Other Clinician: Referring Yovana Scogin: Treating Gurvir Schrom/Extender: [name] in Treatment: 8 Active Problems Location of Pain Severity and Description of Pain Patient Has Paino No Site Locations Rate the pain. Current Pain Level: 0 Pain Management and Medication Current Pain Management: Medication: No Cold Application: No Rest: No Massage: No Activity: No T.E.N.S.: No Heat Application: No Leg drop or elevation: No Is the Current Pain Management Adequate: Adequate How does your wound impact your activities of daily  livingo Sleep: No Bathing: No Appetite: No Relationship With Others: No Bladder Continence: No Emotions: No Bowel Continence: No Work: No Toileting: No Drive: No Dressing: No Hobbies: No Electronic Signature(s) Signed: 01/31/2020 5:54:32 PM By: [name] Entered By: [name] on 01/31/2020 08:21:01 -------------------------------------------------------------------------------- Patient/Caregiver Education Details Patient Name: Date of Service: [place] Judeen [place], [place]. 11/30/2021andnbsp8:00 [place] Record Number: [id number] Patient Account Number: [id number] Date of Birth/Gender: Treating RN: 03/28/1970 (49 y.o. [name] Primary Care Physician: [name] Other Clinician: Referring Physician: Treating Physician/Extender: [name] in Treatment: 8 Education Assessment Education Provided To: Patient Education Topics Provided Wound/Skin Impairment: Methods: Explain/Verbal Responses: State content correctly Electronic Signature(s) Signed: 01/31/2020 5:56:00 PM By: [name] Coria RN Entered By: [name] on 01/31/2020 08:24:03 -------------------------------------------------------------------------------- Wound Assessment Details Patient Name: Date of Service: [name], [name] 01/31/2020 8:00 A M Medical Record Number: [id number] Patient Account Number: [id number] Date of Birth/Sex: Treating RN: [birth year] (49 y.o. [name] Primary Care Ayvion Kavanagh: [name] Other Clinician: Referring Terie Lear: Treating Gisel Vipond/Extender: [name]  in Treatment: 8 Wound Status Wound Number: 3 Primary Etiology: Abrasion Wound Location: Right Flank Wound Status: Open Wounding Event: Trauma Comorbid History: Paraplegia Date Acquired: 11/20/2019 Weeks Of Treatment: 8 Clustered Wound: No Wound Measurements Length: (cm) 0.3 Width: (cm) 0.7 Depth: (cm) 2.4 Area: (cm) 0.1 Volume: (cm) 0.3 %  Reduction in Area: 99% % Reduction in Volume: 98.7% Epithelialization: Small (1-33%) 65 Tunneling: Yes 96 Position (o'clock): 12 Maximum Distance: (cm) 3.4 Undermining: No Wound Description Classification: Full Thickness Without Exposed Support Structures Wound Margin: Flat and Intact Exudate Amount: Large Exudate Type: Purulent Exudate Color: yellow, brown, green Foul Odor After Cleansing: No Slough/Fibrino Yes Wound Bed Granulation Amount: Large (67-100%) Exposed Structure Granulation Quality: Red Fascia Exposed: No Necrotic Amount: None Present (0%) Fat Layer (Subcutaneous Tissue) Exposed: Yes Tendon Exposed: No Muscle Exposed: No Joint Exposed: No Bone Exposed: No Treatment Notes Wound #3 (Right Flank) 1. Cleanse With Wound Cleanser 3. Primary Dressing Applied Packing strip 4. Secondary Dressing Foam Border Dressing 5. Secured With Self Adhesive Bandage Notes iodoform packing strip as primary dressing to right flank. Electronic Signature(s) Signed: 01/31/2020 5:54:32 PM By: [name] Entered By: [name] on 01/31/2020 08:21:30 -------------------------------------------------------------------------------- Wound Assessment Details Patient Name: Date of Service: [name] 01/31/2020 8:00 [place] Record Number: [id number] Patient Account Number: [id number] Date of Birth/Sex: Treating RN: 1970/09/16 (49 y.o. [name] Primary Care Sedonia Kitner: [name] Other Clinician: Referring Sigifredo Pignato: Treating Dollie Bressi/Extender: [name] in Treatment: 8 Wound Status Wound Number: 4 Primary Etiology: Pressure Ulcer Wound Location: Left Trochanter Wound Status: Open Wounding Event: Pressure Injury Comorbid History: Paraplegia Date Acquired: 10/02/2019 Weeks Of Treatment: 8 Clustered Wound: No Wound Measurements Length: (cm) 0.2 Width: (cm) 0.2 Depth: (cm) 0.1 Area: (cm) 0.0 Volume: (cm) 0.0 Wound  Description Classification: Category/Stage IV Wound Margin: Thickened Exudate Amount: None Present Foul Odor After Cleansing: Slough/Fibrino % Reduction in Area: 97.1% % Reduction in Volume: 97.2% Epithelialization: Large (67-100%) 31 Tunneling: No 03 Undermining: No No No Wound Bed Granulation Amount: Small (1-33%) Exposed Structure Granulation Quality: Pink, Pale Fascia Exposed: No Necrotic Amount: None Present (0%) Fat Layer (Subcutaneous Tissue) Exposed: No Tendon Exposed: No Muscle Exposed: No Joint Exposed: No Bone Exposed: No Treatment Notes Wound #4 (Left Trochanter) 1. Cleanse With Wound Cleanser 3. Primary Dressing Applied Calcium Alginate Ag 4. Secondary Dressing Foam Border Dressing 5. Secured With Office manager) Signed: 01/31/2020 5:54:32 PM By: [name] Entered By: [name] on 01/31/2020 08:21:49 -------------------------------------------------------------------------------- Vitals Details Patient Name: Date of Service: [place], [place]. 01/31/2020 8:00 [place] Record Number: [id number] Patient Account Number: [id number] Date of Birth/Sex: Treating RN: Sep 03, 1970 (49 y.o. [name] Primary Care Johni Narine: [name] Other Clinician: Referring Dalonda Simoni: Treating Kittie Krizan/Extender: [name] in Treatment: 8 Vital Signs Time Taken: 08:08 Temperature (F): 98 Height (in): 79 Pulse (bpm): 87 Weight (lbs): 325 Respiratory Rate (breaths/min): 20 Body Mass Index (BMI): 36.6 Blood Pressure (mmHg): 134/82 Reference Range: 80 - 120 mg / dl Electronic Signature(s) Signed: 01/31/2020 5:54:32 PM By: [name] Entered By: [name] on 01/31/2020 08:20:53
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
## 2020-02-01 DIAGNOSIS — L89224 Pressure ulcer of left hip, stage 4: Secondary | ICD-10-CM | POA: Diagnosis not present

## 2020-02-01 DIAGNOSIS — S31000D Unspecified open wound of lower back and pelvis without penetration into retroperitoneum, subsequent encounter: Secondary | ICD-10-CM | POA: Diagnosis not present

## 2020-02-01 DIAGNOSIS — Z960 Presence of urogenital implants: Secondary | ICD-10-CM | POA: Diagnosis not present

## 2020-02-01 DIAGNOSIS — L03319 Cellulitis of trunk, unspecified: Secondary | ICD-10-CM | POA: Diagnosis not present

## 2020-02-01 DIAGNOSIS — G8221 Paraplegia, complete: Secondary | ICD-10-CM | POA: Diagnosis not present

## 2020-02-06 DIAGNOSIS — G8221 Paraplegia, complete: Secondary | ICD-10-CM | POA: Diagnosis not present

## 2020-02-06 DIAGNOSIS — L89224 Pressure ulcer of left hip, stage 4: Secondary | ICD-10-CM | POA: Diagnosis not present

## 2020-02-06 DIAGNOSIS — S31000D Unspecified open wound of lower back and pelvis without penetration into retroperitoneum, subsequent encounter: Secondary | ICD-10-CM | POA: Diagnosis not present

## 2020-02-06 DIAGNOSIS — L03319 Cellulitis of trunk, unspecified: Secondary | ICD-10-CM | POA: Diagnosis not present

## 2020-02-06 DIAGNOSIS — Z960 Presence of urogenital implants: Secondary | ICD-10-CM | POA: Diagnosis not present

## 2020-02-08 ENCOUNTER — Ambulatory Visit
Admission: RE | Admit: 2020-02-08 | Discharge: 2020-02-08 | Disposition: A | Discharge status: Home / Self Care | Payer: Medicare Other | Source: Ambulatory Visit | Attending: Physician Assistant | Admitting: Physician Assistant

## 2020-02-08 DIAGNOSIS — L03319 Cellulitis of trunk, unspecified: Secondary | ICD-10-CM | POA: Diagnosis not present

## 2020-02-08 DIAGNOSIS — L0291 Cutaneous abscess, unspecified: Secondary | ICD-10-CM

## 2020-02-08 DIAGNOSIS — S31000D Unspecified open wound of lower back and pelvis without penetration into retroperitoneum, subsequent encounter: Secondary | ICD-10-CM | POA: Diagnosis not present

## 2020-02-08 DIAGNOSIS — L89224 Pressure ulcer of left hip, stage 4: Secondary | ICD-10-CM | POA: Diagnosis not present

## 2020-02-08 DIAGNOSIS — Z960 Presence of urogenital implants: Secondary | ICD-10-CM | POA: Diagnosis not present

## 2020-02-08 DIAGNOSIS — G8221 Paraplegia, complete: Secondary | ICD-10-CM | POA: Diagnosis not present

## 2020-02-10 DIAGNOSIS — Z960 Presence of urogenital implants: Secondary | ICD-10-CM | POA: Diagnosis not present

## 2020-02-10 DIAGNOSIS — L03319 Cellulitis of trunk, unspecified: Secondary | ICD-10-CM | POA: Diagnosis not present

## 2020-02-10 DIAGNOSIS — L89224 Pressure ulcer of left hip, stage 4: Secondary | ICD-10-CM | POA: Diagnosis not present

## 2020-02-10 DIAGNOSIS — G8221 Paraplegia, complete: Secondary | ICD-10-CM | POA: Diagnosis not present

## 2020-02-10 DIAGNOSIS — S31000D Unspecified open wound of lower back and pelvis without penetration into retroperitoneum, subsequent encounter: Secondary | ICD-10-CM | POA: Diagnosis not present

## 2020-02-14 ENCOUNTER — Other Ambulatory Visit: Payer: Self-pay | Admitting: Internal Medicine

## 2020-02-14 ENCOUNTER — Encounter (HOSPITAL_BASED_OUTPATIENT_CLINIC_OR_DEPARTMENT_OTHER): Payer: Medicare Other | Attending: Internal Medicine | Admitting: Internal Medicine

## 2020-02-14 ENCOUNTER — Other Ambulatory Visit: Payer: Self-pay

## 2020-02-14 ENCOUNTER — Other Ambulatory Visit (HOSPITAL_COMMUNITY): Payer: Self-pay | Admitting: Internal Medicine

## 2020-02-14 ENCOUNTER — Encounter (HOSPITAL_COMMUNITY): Payer: Self-pay

## 2020-02-14 DIAGNOSIS — L98423 Non-pressure chronic ulcer of back with necrosis of muscle: Secondary | ICD-10-CM | POA: Insufficient documentation

## 2020-02-14 DIAGNOSIS — G8221 Paraplegia, complete: Secondary | ICD-10-CM | POA: Insufficient documentation

## 2020-02-14 DIAGNOSIS — L02212 Cutaneous abscess of back [any part, except buttock]: Secondary | ICD-10-CM | POA: Insufficient documentation

## 2020-02-14 DIAGNOSIS — B964 Proteus (mirabilis) (morganii) as the cause of diseases classified elsewhere: Secondary | ICD-10-CM | POA: Insufficient documentation

## 2020-02-14 DIAGNOSIS — L0291 Cutaneous abscess, unspecified: Secondary | ICD-10-CM

## 2020-02-14 DIAGNOSIS — L98492 Non-pressure chronic ulcer of skin of other sites with fat layer exposed: Secondary | ICD-10-CM | POA: Diagnosis not present
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
## 2020-02-14 NOTE — Progress Notes (Addendum)
[name], [name] ([id number]) Visit Report for 02/14/2020 Arrival Information Details Patient Name: Date of Service: [name], [name]. 02/14/2020 8:00 A M Medical Record Number: [id number] Patient Account Number: [id number] Date of Birth/Sex: Treating RN: 01/01/1971 (49 y.o. [name], [name] Primary Care Pansy Ostrovsky: [name] Other Clinician: Referring Ji Fairburn: Treating [name]/Extender: Marin Shutter in Treatment: 10 Visit Information History Since Last Visit Added or deleted any medications: No Patient Arrived: Wheel Chair Any new allergies or adverse reactions: No Arrival Time: 08:22 Had a fall or experienced change in No Accompanied By: self activities of daily living that may affect Transfer Assistance: None risk of falls: Patient Identification Verified: Yes Signs or symptoms of abuse/neglect since last visito No Secondary Verification Process Completed: Yes Hospitalized since last visit: No Patient Requires Transmission-Based Precautions: No Implantable device outside of the clinic excluding No Patient Has Alerts: No cellular tissue based products placed in the center since last visit: Has Dressing in Place as Prescribed: Yes Pain Present Now: No Electronic Signature(s) Signed: 02/14/2020 4:55:24 PM By: Baruch Gouty RN, BSN Entered By: Baruch Gouty on 02/14/2020 08:23:16 -------------------------------------------------------------------------------- Clinic Level of Care Assessment Details Patient Name: Date of Service: [name], [name]. 02/14/2020 8:00 A M Medical Record Number: [id number] Patient Account Number: [id number] Date of Birth/Sex: Treating RN: September 23, 1970 (49 y.o. [name], [name] Primary Care [name]: [name] Other Clinician: Referring [name]: Treating [name]/Extender: Marin Shutter in Treatment: 10 Clinic Level of Care Assessment Items TOOL 4 Quantity Score X- 1 0 Use when  only an EandM is performed on FOLLOW-UP visit ASSESSMENTS - Nursing Assessment / Reassessment X- 1 10 Reassessment of Co-morbidities (includes updates in patient status) X- 1 5 Reassessment of Adherence to Treatment Plan ASSESSMENTS - Wound and Skin A ssessment / Reassessment X - Simple Wound Assessment / Reassessment - one wound 1 5 _0  - 0 Complex Wound Assessment / Reassessment - multiple wounds X- 1 10 Dermatologic / Skin Assessment (not related to wound area) ASSESSMENTS - Focused Assessment _1  - 0 Circumferential Edema Measurements - multi extremities _2  - 0 Nutritional Assessment / Counseling / Intervention _3  - 0 Lower Extremity Assessment (monofilament, tuning fork, pulses) _4  - 0 Peripheral Arterial Disease Assessment (using hand held doppler) ASSESSMENTS - Ostomy and/or Continence Assessment and Care _5  - 0 Incontinence Assessment and Management _6  - 0 Ostomy Care Assessment and Management (repouching, etc.) PROCESS - Coordination of Care X - Simple Patient / Family Education for ongoing care 1 15 _7  - 0 Complex (extensive) Patient / Family Education for ongoing care X- 1 10 Staff obtains Programmer, systems, Records, T Results / Process Orders est X- 1 10 Staff telephones HHA, Nursing Homes / Clarify orders / etc _8  - 0 Routine Transfer to another Facility (non-emergent condition) _9  - 0 Routine Hospital Admission (non-emergent condition) _10  - 0 New Admissions / Biomedical engineer / Ordering NPWT Apligraf, etc. , _11  - 0 Emergency Hospital Admission (emergent condition) X- 1 10 Simple Discharge Coordination _12  - 0 Complex (extensive) Discharge Coordination PROCESS - Special Needs _13  - 0 Pediatric / Minor Patient Management _14  - 0 Isolation Patient Management _15  - 0 Hearing / Language / Visual special needs _16  - 0 Assessment of Community assistance (transportation, D/C planning, etc.) _17  - 0 Additional assistance / Altered mentation _18  - 0 Support  Surface(s) Assessment (bed, cushion, seat, etc.) INTERVENTIONS - Wound Cleansing / Measurement X - Simple Wound Cleansing - one wound 1 5 _19  -  0 Complex Wound Cleansing - multiple wounds X- 1 5 Wound Imaging (photographs - any number of wounds) _0  - 0 Wound Tracing (instead of photographs) X- 1 5 Simple Wound Measurement - one wound _1  - 0 Complex Wound Measurement - multiple wounds INTERVENTIONS - Wound Dressings X - Small Wound Dressing one or multiple wounds 1 10 _2  - 0 Medium Wound Dressing one or multiple wounds _3  - 0 Large Wound Dressing one or multiple wounds <[id number]>_6<\/CLEXNTZGYFVCBSWH>_6  - 0 Application of Medications - topical <PRFFMBWGYKZLDJTT>_0<\/VXBLTJQZESPQZRAQ>_7  - 0 Application of Medications - injection INTERVENTIONS - Miscellaneous _6  - 0 External ear exam _7  - 0 Specimen Collection (cultures, biopsies, blood, body fluids, etc.) _8  - 0 Specimen(s) / Culture(s) sent or taken to Lab for analysis _9  - 0 Patient Transfer (multiple staff / Civil Service fast streamer / Similar devices) _10  - 0 Simple Staple / Suture removal (25 or less) _11  - 0 Complex Staple / Suture removal (26 or more) _12  - 0 Hypo / Hyperglycemic Management (close monitor of Blood Glucose) _13  - 0 Ankle / Brachial Index (ABI) - do not check if billed separately X- 1 5 Vital Signs Has the patient been seen at the hospital within the last three years: Yes Total Score: 105 Level Of Care: New/Established - Level 3 Electronic Signature(s) Signed: 02/15/2020 9:59:17 AM By: Rhae Hammock RN Entered By: Rhae Hammock on 02/14/2020 09:02:02 -------------------------------------------------------------------------------- Encounter Discharge Information Details Patient Name: Date of Service: [name], [name] 02/14/2020 8:00 A M Medical Record Number: [id number] Patient Account Number: [id number] Date of Birth/Sex: Treating RN: June 29, 1970 (49 y.o. Hessie Diener Primary Care Jeanie Mccard: [name] Other Clinician: Referring Turhan Chill: Treating Amera Banos/Extender:  Marin Shutter in Treatment: 10 Encounter Discharge Information Items Discharge Condition: Stable Ambulatory Status: Wheelchair Discharge Destination: Home Transportation: Private Auto Accompanied By: self Schedule Follow-up Appointment: Yes Clinical Summary of Care: Electronic Signature(s) Signed: 02/14/2020 4:43:44 PM By: Deon Pilling Entered By: Deon Pilling on 02/14/2020 09:11:56 -------------------------------------------------------------------------------- Lower Extremity Assessment Details Patient Name: Date of Service: [place], [place]. 02/14/2020 8:00 A M Medical Record Number: [id number] Patient Account Number: [id number] Date of Birth/Sex: Treating RN: 1970-11-16 (49 y.o. Ernestene Mention Primary Care Jerrod Damiano: [name] Other Clinician: Referring Santiana Glidden: Treating Corinne Goucher/Extender: Marin Shutter in Treatment: 10 Electronic Signature(s) Signed: 02/14/2020 4:55:24 PM By: Baruch Gouty RN, BSN Entered By: Baruch Gouty on 02/14/2020 08:24:05 -------------------------------------------------------------------------------- Multi Wound Chart Details Patient Name: Date of Service: [place], [place] [name] 02/14/2020 8:00 A M Medical Record Number: [id number] Patient Account Number: [id number] Date of Birth/Sex: Treating RN: 10/14/1970 (49 y.o. Oval Linsey Primary Care Brenn Deziel: [name] Other Clinician: Referring Deasha Clendenin: Treating Dunia Pringle/Extender: Marin Shutter in Treatment: 10 Vital Signs Height(in): 79 Pulse(bpm): 74 Weight(lbs): 325 Blood Pressure(mmHg): 115/77 Body Mass Index(BMI): 37 Temperature(F): 97.4 Respiratory Rate(breaths/min): 18 Photos: [3:No Photos Right Flank] [4:No Photos Left Trochanter] [N/A:N/A N/A] Wound Location: [3:Trauma] [4:Pressure Injury] [N/A:N/A] Wounding Event: [3:Abrasion] [4:Pressure Ulcer] [N/A:N/A] Primary Etiology: [3:Paraplegia]  [4:Paraplegia] [N/A:N/A] Comorbid History: [3:11/20/2019] [4:10/02/2019] [N/A:N/A] Date Acquired: [3:10] [4:10] [N/A:N/A] Weeks of Treatment: [3:Open] [4:Open] [N/A:N/A] Wound Status: [3:0.7x1.4x2] [4:0x0x0] [N/A:N/A] Measurements L x W x D (cm) [3:0.77] [4:0] [N/A:N/A] A (cm) : rea [3:1.539] [4:0] [N/A:N/A] Volume (cm) : [3:95.50%] [4:100.00%] [N/A:N/A] % Reduction in A rea: [3:95.00%] [4:100.00%] [N/A:N/A] % Reduction in Volume: [3:11] Position 1 (o'clock): [3:7.5] Maximum Distance 1 (cm): [3:Yes] [4:No] [N/A:N/A] Tunneling: [3:Full Thickness Without Exposed] [4:Category/Stage IV] [N/A:N/A] Classification: [3:Support Structures  Medium] [4:None Present] [N/A:N/A] Exudate Amount: [3:Serosanguineous] [4:N/A] [N/A:N/A] Exudate Type: [3:red, brown] [4:N/A] [N/A:N/A] Exudate Color: [3:Distinct, outline attached] [4:Thickened] [N/A:N/A] Wound Margin: [3:Large (67-100%)] [4:None Present (0%)] [N/A:N/A] Granulation Amount: [3:Red] [4:N/A] [N/A:N/A] Granulation Quality: [3:None Present (0%)] [4:None Present (0%)] [N/A:N/A] Necrotic Amount: [3:Fat Layer (Subcutaneous Tissue): Yes Fascia: No] [N/A:N/A] Exposed Structures: [3:Fascia: No Tendon: No Muscle: No Joint: No Bone: No None] [4:Fat Layer (Subcutaneous Tissue): No Tendon: No Muscle: No Joint: No Bone: No Large (67-100%)] [N/A:N/A] Treatment Notes Electronic Signature(s) Signed: 02/14/2020 4:37:04 PM By: Carlene Coria RN Signed: 02/14/2020 5:21:51 PM By: Linton Ham MD Entered By: Linton Ham on 02/14/2020 09:01:44 -------------------------------------------------------------------------------- Multi-Disciplinary Care Plan Details Patient Name: Date of Service: [name], [name] 02/14/2020 8:00 A M Medical Record Number: [id number] Patient Account Number: [id number] Date of Birth/Sex: Treating RN: [birth year] (49 y.o. [name], [name] Primary Care Endiya Klahr: [name] Other Clinician: Referring Kennie Karapetian: Treating  Zackory Pudlo/Extender: Marin Shutter in Treatment: 10 Active Inactive Wound/Skin Impairment Nursing Diagnoses: Knowledge deficit related to ulceration/compromised skin integrity Goals: Patient/caregiver will verbalize understanding of skin care regimen Date Initiated: 12/06/2019 Target Resolution Date: 03/29/2020 Goal Status: Active Ulcer/skin breakdown will have a volume reduction of 30% by week 4 Date Initiated: 12/06/2019 Date Inactivated: 01/09/2020 Target Resolution Date: 01/13/2020 Goal Status: Met Ulcer/skin breakdown will have a volume reduction of 50% by week 8 Date Initiated: 01/09/2020 Target Resolution Date: 03/29/2020 Goal Status: Active Interventions: Assess patient/caregiver ability to obtain necessary supplies Assess patient/caregiver ability to perform ulcer/skin care regimen upon admission and as needed Assess ulceration(s) every visit Notes: Electronic Signature(s) Signed: 02/14/2020 8:15:26 AM By: Rhae Hammock RN Entered By: Rhae Hammock on 02/14/2020 08:15:26 -------------------------------------------------------------------------------- Pain Assessment Details Patient Name: Date of Service: [name], [place]. 02/14/2020 8:00 A M Medical Record Number: [id number] Patient Account Number: [id number] Date of Birth/Sex: Treating RN: 16-May-1970 (49 y.o. Ernestene Mention Primary Care Sydney Hasten: [name] Other Clinician: Referring Nikoleta Dady: Treating Jedidiah Demartini/Extender: Marin Shutter in Treatment: 10 Active Problems Location of Pain Severity and Description of Pain Patient Has Paino No Site Locations Rate the pain. Current Pain Level: 0 Pain Management and Medication Current Pain Management: Electronic Signature(s) Signed: 02/14/2020 4:55:24 PM By: Baruch Gouty RN, BSN Entered By: Baruch Gouty on 02/14/2020  08:23:57 -------------------------------------------------------------------------------- Patient/Caregiver Education Details Patient Name: Date of Service: [name], [place] 12/14/2021andnbsp8:00 [place] Record Number: [id number] Patient Account Number: [id number] Date of Birth/Gender: Treating RN: Aug 30, 1970 (49 y.o. Erie Noe Primary Care Physician: [name] Other Clinician: Referring Physician: Treating Physician/Extender: Marin Shutter in Treatment: 10 Education Assessment Education Provided To: Patient Education Topics Provided Wound/Skin Impairment: Methods: Explain/Verbal Responses: State content correctly Electronic Signature(s) Signed: 02/15/2020 9:59:17 AM By: Rhae Hammock RN Entered By: Rhae Hammock on 02/14/2020 08:15:38 -------------------------------------------------------------------------------- Wound Assessment Details Patient Name: Date of Service: [place], [place]. 02/14/2020 8:00 A M Medical Record Number: [id number] Patient Account Number: [id number] Date of Birth/Sex: Treating RN: 09-01-1970 (49 y.o. Ernestene Mention Primary Care Rashaud Ybarbo: [name] Other Clinician: Referring Rockwell Zentz: Treating Gabbriella Presswood/Extender: Marin Shutter in Treatment: 10 Wound Status Wound Number: 3 Primary Etiology: Abrasion Wound Location: Right Flank Wound Status: Open Wounding Event: Trauma Comorbid History: Paraplegia Date Acquired: 11/20/2019 Weeks Of Treatment: 10 Clustered Wound: No Wound Measurements Length: (cm) 0.7 Width: (cm) 1.4 Depth: (cm) 2 Area: (cm) 0.77 Volume: (cm) 1.539 % Reduction in Area: 95.5% % Reduction in  Volume: 95% Epithelialization: None Tunneling: Yes Position (o'clock): 11 Maximum Distance: (cm) 7.5 Undermining: No Wound Description Classification: Full Thickness Without Exposed Support Structu Wound Margin: Distinct, outline attached Exudate  Amount: Medium Exudate Type: Serosanguineous Exudate Color: red, brown Wound Bed Granulation Amount: Large (67-100%) Granulation Quality: Red Necrotic Amount: None Present (0%) res Foul Odor After Cleansing: No Slough/Fibrino Yes Exposed Structure Fascia Exposed: No Fat Layer (Subcutaneous Tissue) Exposed: Yes Tendon Exposed: No Muscle Exposed: No Joint Exposed: No Bone Exposed: No Treatment Notes Wound #3 (Flank) Wound Laterality: Right Cleanser Wound Cleanser Discharge Instruction: Cleanse the wound with wound cleanser prior to applying a clean dressing using gauze sponges, not tissue or cotton balls. Peri-Wound Care Topical Primary Dressing Iodoform packing strip 1/4 (in) Discharge Instruction: Lightly pack as instructed Secondary Dressing Woven Gauze Sponge, Non-Sterile 4x4 in Discharge Instruction: Apply over primary dressing as directed. ABD Pad, 5x9 Discharge Instruction: Apply over primary dressing as directed. Secured With Paper Tape, 2x10 (in/yd) Discharge Instruction: Secure dressing with tape as directed. Compression Wrap Compression Stockings Add-Ons Electronic Signature(s) Signed: 02/14/2020 4:55:24 PM By: Baruch Gouty RN, BSN Entered By: Baruch Gouty on 02/14/2020 08:33:54 -------------------------------------------------------------------------------- Wound Assessment Details Patient Name: Date of Service: [name], [name] 02/14/2020 8:00 A M Medical Record Number: [id number] Patient Account Number: [id number] Date of Birth/Sex: Treating RN: Jun 04, 1970 (50 y.o. Ernestene Mention Primary Care Aliyah Abeyta: [name] Other Clinician: Referring Gyan Cambre: Treating Mariluz Crespo/Extender: Marin Shutter in Treatment: 10 Wound Status Wound Number: 4 Primary Etiology: Pressure Ulcer Wound Location: Left Trochanter Wound Status: Open Wounding Event: Pressure Injury Comorbid History: Paraplegia Date Acquired: 10/02/2019 Weeks  Of Treatment: 10 Clustered Wound: No Wound Measurements Length: (cm) Width: (cm) Depth: (cm) Area: (cm) Volume: (cm) 0 % Reduction in Area: 100% 0 % Reduction in Volume: 100% 0 Epithelialization: Large (67-100%) 0 Tunneling: No 0 Undermining: No Wound Description Classification: Category/Stage IV Wound Margin: Thickened Exudate Amount: None Present Foul Odor After Cleansing: No Slough/Fibrino No Wound Bed Granulation Amount: None Present (0%) Exposed Structure Necrotic Amount: None Present (0%) Fascia Exposed: No Fat Layer (Subcutaneous Tissue) Exposed: No Tendon Exposed: No Muscle Exposed: No Joint Exposed: No Bone Exposed: No Electronic Signature(s) Signed: 02/14/2020 4:55:24 PM By: Baruch Gouty RN, BSN Entered By: Baruch Gouty on 02/14/2020 08:34:08 -------------------------------------------------------------------------------- [place] Details Patient Name: Date of Service: [name], [place] [name] 02/14/2020 8:00 A M Medical Record Number: [id number] Patient Account Number: [id number] Date of Birth/Sex: Treating RN: 12/31/1970 (49 y.o. Ernestene Mention Primary Care Laurianne Floresca: [name] Other Clinician: Referring Madolin Twaddle: Treating Reigan Tolliver/Extender: Marin Shutter in Treatment: 10 Vital Signs Time Taken: 08:23 Temperature (F): 97.4 Height (in): 79 Pulse (bpm): 74 Source: Stated Respiratory Rate (breaths/min): 18 Weight (lbs): 325 Blood Pressure (mmHg): 115/77 Source: Stated Reference Range: 80 - 120 mg / dl Body Mass Index (BMI): 36.6 Electronic Signature(s) Signed: 02/14/2020 4:55:24 PM By: Baruch Gouty RN, BSN Entered By: Baruch Gouty on 02/14/2020 08:23:47
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
## 2020-02-17 DIAGNOSIS — Z960 Presence of urogenital implants: Secondary | ICD-10-CM | POA: Diagnosis not present

## 2020-02-17 DIAGNOSIS — G8221 Paraplegia, complete: Secondary | ICD-10-CM | POA: Diagnosis not present

## 2020-02-17 DIAGNOSIS — L89224 Pressure ulcer of left hip, stage 4: Secondary | ICD-10-CM | POA: Diagnosis not present

## 2020-02-17 DIAGNOSIS — L03319 Cellulitis of trunk, unspecified: Secondary | ICD-10-CM | POA: Diagnosis not present

## 2020-02-17 DIAGNOSIS — S31000D Unspecified open wound of lower back and pelvis without penetration into retroperitoneum, subsequent encounter: Secondary | ICD-10-CM | POA: Diagnosis not present

## 2020-02-19 DIAGNOSIS — G8221 Paraplegia, complete: Secondary | ICD-10-CM | POA: Diagnosis not present

## 2020-02-19 DIAGNOSIS — Z960 Presence of urogenital implants: Secondary | ICD-10-CM | POA: Diagnosis not present

## 2020-02-19 DIAGNOSIS — L89224 Pressure ulcer of left hip, stage 4: Secondary | ICD-10-CM | POA: Diagnosis not present

## 2020-02-21 ENCOUNTER — Other Ambulatory Visit: Payer: Self-pay | Admitting: Physician Assistant

## 2020-02-22 ENCOUNTER — Encounter (HOSPITAL_COMMUNITY): Payer: Self-pay

## 2020-02-22 ENCOUNTER — Other Ambulatory Visit: Payer: Self-pay

## 2020-02-22 ENCOUNTER — Ambulatory Visit (HOSPITAL_COMMUNITY)
Admission: RE | Admit: 2020-02-22 | Discharge: 2020-02-22 | Disposition: A | Discharge status: Home / Self Care | Payer: Medicare Other | Source: Ambulatory Visit | Attending: Internal Medicine | Admitting: Internal Medicine

## 2020-02-22 ENCOUNTER — Other Ambulatory Visit (HOSPITAL_COMMUNITY): Payer: Self-pay | Admitting: Internal Medicine

## 2020-02-22 DIAGNOSIS — G8221 Paraplegia, complete: Secondary | ICD-10-CM | POA: Diagnosis not present

## 2020-02-22 DIAGNOSIS — S31000A Unspecified open wound of lower back and pelvis without penetration into retroperitoneum, initial encounter: Secondary | ICD-10-CM | POA: Insufficient documentation

## 2020-02-22 DIAGNOSIS — S31119A Laceration without foreign body of abdominal wall, unspecified quadrant without penetration into peritoneal cavity, initial encounter: Secondary | ICD-10-CM | POA: Diagnosis not present

## 2020-02-22 DIAGNOSIS — K651 Peritoneal abscess: Secondary | ICD-10-CM | POA: Diagnosis not present

## 2020-02-22 DIAGNOSIS — L0291 Cutaneous abscess, unspecified: Secondary | ICD-10-CM

## 2020-02-22 DIAGNOSIS — S24102S Unspecified injury at T2-T6 level of thoracic spinal cord, sequela: Secondary | ICD-10-CM | POA: Insufficient documentation

## 2020-02-22 DIAGNOSIS — L89224 Pressure ulcer of left hip, stage 4: Secondary | ICD-10-CM | POA: Diagnosis not present

## 2020-02-22 DIAGNOSIS — G8220 Paraplegia, unspecified: Secondary | ICD-10-CM | POA: Insufficient documentation

## 2020-02-22 DIAGNOSIS — Z960 Presence of urogenital implants: Secondary | ICD-10-CM | POA: Diagnosis not present

## 2020-02-22 LAB — CBC WITH DIFFERENTIAL/PLATELET
Abs Immature Granulocytes: 0.01 10*3/uL (ref 0.00–0.07)
Basophils Absolute: 0 10*3/uL (ref 0.0–0.1)
Basophils Relative: 0 %
Eosinophils Absolute: 0.1 10*3/uL (ref 0.0–0.5)
Eosinophils Relative: 3 %
HCT: 35.4 % — ABNORMAL LOW (ref 39.0–52.0)
Hemoglobin: 10.7 g/dL — ABNORMAL LOW (ref 13.0–17.0)
Immature Granulocytes: 0 %
Lymphocytes Relative: 35 %
Lymphs Abs: 1.3 10*3/uL (ref 0.7–4.0)
MCH: 26.2 pg (ref 26.0–34.0)
MCHC: 30.2 g/dL (ref 30.0–36.0)
MCV: 86.6 fL (ref 80.0–100.0)
Monocytes Absolute: 0.5 10*3/uL (ref 0.1–1.0)
Monocytes Relative: 13 %
Neutro Abs: 1.7 10*3/uL (ref 1.7–7.7)
Neutrophils Relative %: 49 %
Platelets: 208 10*3/uL (ref 150–400)
RBC: 4.09 MIL/uL — ABNORMAL LOW (ref 4.22–5.81)
RDW: 14.9 % (ref 11.5–15.5)
WBC: 3.5 10*3/uL — ABNORMAL LOW (ref 4.0–10.5)
nRBC: 0 % (ref 0.0–0.2)

## 2020-02-22 LAB — PROTIME-INR
INR: 1.1 (ref 0.8–1.2)
Prothrombin Time: 13.7 s (ref 11.4–15.2)

## 2020-02-22 MED ORDER — FENTANYL CITRATE (PF) 100 MCG/2ML IJ SOLN
INTRAMUSCULAR | Status: AC
  Filled 2020-02-22: qty 2

## 2020-02-22 MED ORDER — MIDAZOLAM HCL 2 MG/2ML IJ SOLN
INTRAMUSCULAR | Status: AC
  Filled 2020-02-22: qty 2

## 2020-02-22 MED ORDER — LIDOCAINE-EPINEPHRINE 1 %-1:100000 IJ SOLN
INTRAMUSCULAR | Status: AC
  Filled 2020-02-22: qty 1

## 2020-02-22 NOTE — Discharge Instructions (Addendum)
Percutaneous Abscess Drain, Care After °This sheet gives you information about how to care for yourself after your procedure. Your health care provider may also give you more specific instructions. If you have problems or questions, contact your health care provider. °What can I expect after the procedure? °After your procedure, it is common to have: °· A small amount of bruising and discomfort in the area where the drainage tube (catheter) was placed. °· Sleepiness and fatigue. This should go away after the medicines you were given have worn off. °Follow these instructions at home: °Incision care °· Follow instructions from your health care provider about how to take care of your incision. Make sure you: °? Wash your hands with soap and water before you change your bandage (dressing). If soap and water are not available, use hand sanitizer. °? Change your dressing as told by your health care provider. °? Leave stitches (sutures), skin glue, or adhesive strips in place. These skin closures may need to stay in place for 2 weeks or longer. If adhesive strip edges start to loosen and curl up, you may trim the loose edges. Do not remove adhesive strips completely unless your health care provider tells you to do that. °· Check your incision area every day for signs of infection. Check for: °? More redness, swelling, or pain. °? More fluid or blood. °? Warmth. °? Pus or a bad smell. °? Fluid leaking from around your catheter (instead of fluid draining through your catheter). °Catheter care ° °· Follow instructions from your health care provider about emptying and cleaning your catheter and collection bag. You may need to clean the catheter every day so it does not clog. °· If directed, write down the following information every time you empty your bag: °? The date and time. °? The amount of drainage. °General instructions °· Rest at home for 1-2 days after your procedure. Return to your normal activities as told by your  health care provider. °· Do not take baths, swim, or use a hot tub for 24 hours after your procedure, or until your health care provider says that this is okay. °· Take over-the-counter and prescription medicines only as told by your health care provider. °· Keep all follow-up visits as told by your health care provider. This is important. °Contact a health care provider if: °· You have less than 10 mL of drainage a day for 2-3 days in a row, or as directed by your health care provider. °· You have more redness, swelling, or pain around your incision area. °· You have more fluid or blood coming from your incision area. °· Your incision area feels warm to the touch. °· You have pus or a bad smell coming from your incision area. °· You have fluid leaking from around your catheter (instead of through your catheter). °· You have a fever or chills. °· You have pain that does not get better with medicine. °Get help right away if: °· Your catheter comes out. °· You suddenly stop having drainage from your catheter. °· You suddenly have blood in the fluid that is draining from your catheter. °· You become dizzy or you faint. °· You develop a rash. °· You have nausea or vomiting. °· You have difficulty breathing or you feel short of breath. °· You develop chest pain. °· You have problems with your speech or vision. °· You have trouble balancing or moving your arms or legs. °Summary °· It is common to have a small   amount of bruising and discomfort in the area where the drainage tube (catheter) was placed. °· You may be directed to record the amount of drainage from the bag every time you empty it. °· Follow instructions from your health care provider about emptying and cleaning your catheter and collection bag. °This information is not intended to replace advice given to you by your health care provider. Make sure you discuss any questions you have with your health care provider. °Document Revised: 01/30/2017 Document  Reviewed: 01/10/2016 °Elsevier Patient Education © 2020 [employer] °Moderate Conscious Sedation, Adult °Sedation is the use of medicines to promote relaxation and relieve discomfort and anxiety. Moderate conscious sedation is a type of sedation. Under moderate conscious sedation, you are less alert than normal, but you are still able to respond to instructions, touch, or both. °Moderate conscious sedation is used during short medical and dental procedures. It is milder than deep sedation, which is a type of sedation under which you cannot be easily woken up. It is also milder than general anesthesia, which is the use of medicines to make you unconscious. Moderate conscious sedation allows you to return to your regular activities sooner. °Tell a health care provider about: °· Any allergies you have. °· All medicines you are taking, including vitamins, herbs, eye drops, creams, and over-the-counter medicines. °· Use of steroids (by mouth or creams). °· Any problems you or family members have had with sedatives and anesthetic medicines. °· Any blood disorders you have. °· Any surgeries you have had. °· Any medical conditions you have, such as sleep apnea. °· Whether you are pregnant or may be pregnant. °· Any use of cigarettes, alcohol, marijuana, or street drugs. °What are the risks? °Generally, this is a safe procedure. However, problems may occur, including: °· Getting too much medicine (oversedation). °· Nausea. °· Allergic reaction to medicines. °· Trouble breathing. If this happens, a breathing tube may be used to help with breathing. It will be removed when you are awake and breathing on your own. °· Heart trouble. °· Lung trouble. °What happens before the procedure? °Staying hydrated °Follow instructions from your health care provider about hydration, which may include: °· Up to 2 hours before the procedure - you may continue to drink clear liquids, such as water, clear fruit juice, black coffee, and plain  tea. °Eating and drinking restrictions °Follow instructions from your health care provider about eating and drinking, which may include: °· 8 hours before the procedure - stop eating heavy meals or foods such as meat, fried foods, or fatty foods. °· 6 hours before the procedure - stop eating light meals or foods, such as toast or cereal. °· 6 hours before the procedure - stop drinking milk or drinks that contain milk. °· 2 hours before the procedure - stop drinking clear liquids. °Medicine °Ask your health care provider about: °· Changing or stopping your regular medicines. This is especially important if you are taking diabetes medicines or blood thinners. °· Taking medicines such as aspirin and ibuprofen. These medicines can thin your blood. Do not take these medicines before your procedure if your health care provider instructs you not to. ° °Tests and exams °· You will have a physical exam. °· You may have blood tests done to show: °? How well your kidneys and liver are working. °? How well your blood can clot. °General instructions °· Plan to have someone take you home from the hospital or clinic. °· If you will be going home   right after the procedure, plan to have someone with you for 24 hours. °What happens during the procedure? °· An IV tube will be inserted into one of your veins. °· Medicine to help you relax (sedative) will be given through the IV tube. °· The medical or dental procedure will be performed. °What happens after the procedure? °· Your blood pressure, heart rate, breathing rate, and blood oxygen level will be monitored often until the medicines you were given have worn off. °· Do not drive for 24 hours. °This information is not intended to replace advice given to you by your health care provider. Make sure you discuss any questions you have with your health care provider. °Document Revised: 01/30/2017 Document Reviewed: 06/09/2015 °Elsevier Patient Education © 2020 [employer] ° °
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
## 2020-02-22 NOTE — Procedures (Signed)
Pre procedural Dx: Body wall wound and abscess. Post procedural Dx: Same  Apparent resolution of previously noted subcutaneous fluid collection along the right flank subjacent to the persistently draining subcutaneous wound.    No intervention attempted.   Katherina Right, MD Pager #: [phone]

## 2020-02-22 NOTE — Sedation Documentation (Signed)
Patient's IV removed, patient got himself dressed and into his wheelchair from home. Patient discharged from the nurses station.

## 2020-02-22 NOTE — Sedation Documentation (Signed)
Dr. Grace Isaac decided that the area did not need a drain. No sedation given.

## 2020-02-27 DIAGNOSIS — L89224 Pressure ulcer of left hip, stage 4: Secondary | ICD-10-CM | POA: Diagnosis not present

## 2020-02-27 DIAGNOSIS — Z960 Presence of urogenital implants: Secondary | ICD-10-CM | POA: Diagnosis not present

## 2020-02-27 DIAGNOSIS — G8221 Paraplegia, complete: Secondary | ICD-10-CM | POA: Diagnosis not present

## 2020-02-28 ENCOUNTER — Encounter (HOSPITAL_BASED_OUTPATIENT_CLINIC_OR_DEPARTMENT_OTHER): Payer: Medicare Other | Admitting: Internal Medicine

## 2020-02-28 ENCOUNTER — Other Ambulatory Visit: Payer: Self-pay

## 2020-02-28 DIAGNOSIS — L98492 Non-pressure chronic ulcer of skin of other sites with fat layer exposed: Secondary | ICD-10-CM | POA: Diagnosis not present

## 2020-02-28 DIAGNOSIS — L98423 Non-pressure chronic ulcer of back with necrosis of muscle: Secondary | ICD-10-CM | POA: Diagnosis not present

## 2020-02-28 DIAGNOSIS — L02212 Cutaneous abscess of back [any part, except buttock]: Secondary | ICD-10-CM | POA: Diagnosis not present

## 2020-02-28 DIAGNOSIS — B964 Proteus (mirabilis) (morganii) as the cause of diseases classified elsewhere: Secondary | ICD-10-CM | POA: Diagnosis not present

## 2020-02-28 DIAGNOSIS — G8221 Paraplegia, complete: Secondary | ICD-10-CM | POA: Diagnosis not present
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
## 2020-02-29 NOTE — Progress Notes (Addendum)
[name], [name] ([id number]) Visit Report for 02/28/2020 Arrival Information Details Patient Name: Date of Service: DA [name], [name] [name] 02/28/2020 8:00 A M Medical Record Number: [id number] Patient Account Number: [id number] Date of Birth/Sex: Treating RN: 1970-12-11 (49 y.o. Bayard Hugger, Bonita Quin Primary Care Cj Beecher: Evelena Leyden Other Clinician: Referring Beckam Abdulaziz: Treating Averey Trompeter/Extender: Minda Meo in Treatment: 12 Visit Information History Since Last Visit Added or deleted any medications: No Patient Arrived: Wheel Chair Any new allergies or adverse reactions: No Arrival Time: 08:10 Had a fall or experienced change in No Accompanied By: self activities of daily living that may affect Transfer Assistance: None risk of falls: Patient Identification Verified: Yes Signs or symptoms of abuse/neglect since last visito No Secondary Verification Process Completed: Yes Hospitalized since last visit: No Patient Requires Transmission-Based Precautions: No Implantable device outside of the clinic excluding No Patient Has Alerts: No cellular tissue based products placed in the center since last visit: Has Dressing in Place as Prescribed: Yes Pain Present Now: No Electronic Signature(s) Signed: 02/28/2020 6:35:32 PM By: Zenaida Deed RN, BSN Entered By: Zenaida Deed on 02/28/2020 08:10:53 -------------------------------------------------------------------------------- Encounter Discharge Information Details Patient Name: Date of Service: [name], [place] [name] 02/28/2020 8:00 A M Medical Record Number: [id number] Patient Account Number: [id number] Date of Birth/Sex: Treating RN: 1970-11-16 (49 y.o. [name] Primary Care Shalita Notte: Evelena Leyden Other Clinician: Referring Rutha Melgoza: Treating Loran Auguste/Extender: Minda Meo in Treatment: 12 Encounter Discharge Information Items Discharge Condition: Stable Ambulatory  Status: Wheelchair Discharge Destination: Home Transportation: Private Auto Accompanied By: self Schedule Follow-up Appointment: Yes Clinical Summary of Care: Patient Declined Electronic Signature(s) Signed: 02/28/2020 6:35:32 PM By: Zenaida Deed RN, BSN Entered By: Zenaida Deed on 02/28/2020 08:35:12 -------------------------------------------------------------------------------- Lower Extremity Assessment Details Patient Name: Date of Service: [name] [name] 02/28/2020 8:00 A M Medical Record Number: [id number] Patient Account Number: [id number] Date of Birth/Sex: Treating RN: 1970-10-01 (49 y.o. [name] Primary Care Sheldon Amara: Evelena Leyden Other Clinician: Referring Akin Yi: Treating Wilferd Ritson/Extender: Minda Meo in Treatment: 12 Electronic Signature(s) Signed: 02/28/2020 6:35:32 PM By: Zenaida Deed RN, BSN Entered By: Zenaida Deed on 02/28/2020 08:14:29 -------------------------------------------------------------------------------- Multi Wound Chart Details Patient Name: Date of Service: [name] [name] 02/28/2020 8:00 A M Medical Record Number: [id number] Patient Account Number: [id number] Date of Birth/Sex: Treating RN: 05/22/1970 (49 y.o. [name] Primary Care Tayjah Lobdell: Evelena Leyden Other Clinician: Referring Salisha Bardsley: Treating Saafir Abdullah/Extender: Minda Meo in Treatment: 12 Vital Signs Height(in): 79 Pulse(bpm): 79 Weight(lbs): 325 Blood Pressure(mmHg): 106/72 Body Mass Index(BMI): 37 Temperature(F): 97.6 Respiratory Rate(breaths/min): 18 Photos: [3:No Photos Right Flank] [N/A:N/A N/A] Wound Location: [3:Trauma] [N/A:N/A] Wounding Event: [3:Abrasion] [N/A:N/A] Primary Etiology: [3:Paraplegia] [N/A:N/A] Comorbid History: [3:11/20/2019] [N/A:N/A] Date Acquired: [3:12] [N/A:N/A] Weeks of Treatment: [3:Open] [N/A:N/A] Wound Status: [3:0.4x0.4x1.7]  [N/A:N/A] Measurements L x W x D (cm) [3:0.126] [N/A:N/A] A (cm) : rea [3:0.214] [N/A:N/A] Volume (cm) : [3:99.30%] [N/A:N/A] % Reduction in A rea: [3:99.30%] [N/A:N/A] % Reduction in Volume: [3:12] Position 1 (o'clock): [3:3.3] Maximum Distance 1 (cm): [3:2] Position 2 (o'clock): [3:8.5] Maximum Distance 2 (cm): [3:Yes] [N/A:N/A] Tunneling: [3:Full Thickness Without Exposed] [N/A:N/A] Classification: [3:Support Structures Small] [N/A:N/A] Exudate Amount: [3:Serosanguineous] [N/A:N/A] Exudate Type: [3:red, brown] [N/A:N/A] Exudate Color: [3:Epibole] [N/A:N/A] Wound Margin: [3:Large (67-100%)] [N/A:N/A] Granulation Amount: [3:Red] [N/A:N/A] Granulation Quality: [3:None Present (0%)] [N/A:N/A] Necrotic Amount: [3:Fat Layer (Subcutaneous Tissue): Yes N/A] Exposed Structures: [3:Fascia: No Tendon: No Muscle: No Joint: No Bone:  No None] [N/A:N/A] Treatment Notes Electronic Signature(s) Signed: 02/28/2020 6:35:32 PM By: Zenaida Deed RN, BSN Signed: 02/29/2020 12:07:59 PM By: Baltazar Najjar MD Entered By: Baltazar Najjar on 02/28/2020 08:32:06 -------------------------------------------------------------------------------- Multi-Disciplinary Care Plan Details Patient Name: Date of Service: [name] [name] 02/28/2020 8:00 A M Medical Record Number: [id number] Patient Account Number: [id number] Date of Birth/Sex: Treating RN: [birth year] (49 y.o. [name] Primary Care Nickalaus Crooke: Evelena Leyden Other Clinician: Referring Rubye Strohmeyer: Treating Tristan Proto/Extender: Minda Meo in Treatment: 12 Active Inactive Electronic Signature(s) Signed: 03/27/2020 5:43:35 PM By: Zenaida Deed RN, BSN Previous Signature: 02/28/2020 6:35:32 PM Version By: Zenaida Deed RN, BSN Entered By: Zenaida Deed on 03/20/2020 16:21:29 -------------------------------------------------------------------------------- Pain Assessment Details Patient Name: Date of  Service: DA [name], [place] [name] 02/28/2020 8:00 A M Medical Record Number: [id number] Patient Account Number: [id number] Date of Birth/Sex: Treating RN: 09/23/1970 (49 y.o. [name] Primary Care Masa Lubin: Evelena Leyden Other Clinician: Referring Jaasiel Hollyfield: Treating Kariah Loredo/Extender: Minda Meo in Treatment: 12 Active Problems Location of Pain Severity and Description of Pain Patient Has Paino No Site Locations Rate the pain. Current Pain Level: 0 Pain Management and Medication Current Pain Management: Electronic Signature(s) Signed: 02/28/2020 6:35:32 PM By: Zenaida Deed RN, BSN Entered By: Zenaida Deed on 02/28/2020 08:14:22 -------------------------------------------------------------------------------- Patient/Caregiver Education Details Patient Name: Date of Service: [name], [place] Aldine Contes 12/28/2021andnbsp8:00 A M Medical Record Number: [id number] Patient Account Number: [id number] Date of Birth/Gender: Treating RN: 10/31/1970 (49 y.o. [name] Primary Care Physician: Evelena Leyden Other Clinician: Referring Physician: Treating Physician/Extender: Minda Meo in Treatment: 12 Education Assessment Education Provided To: Patient Education Topics Provided Wound/Skin Impairment: Methods: Explain/Verbal Responses: Reinforcements needed, State content correctly Electronic Signature(s) Signed: 02/28/2020 6:35:32 PM By: Zenaida Deed RN, BSN Entered By: Zenaida Deed on 02/28/2020 08:19:16 -------------------------------------------------------------------------------- Wound Assessment Details Patient Name: Date of Service: [name], [place] [name] 02/28/2020 8:00 A M Medical Record Number: [id number] Patient Account Number: [id number] Date of Birth/Sex: Treating RN: 1970/03/29 (49 y.o. [name] Primary Care Donaldson Richter: Evelena Leyden Other Clinician: Referring Sherrina Zaugg: Treating  Cayli Escajeda/Extender: Minda Meo in Treatment: 12 Wound Status Wound Number: 3 Primary Etiology: Abrasion Wound Location: Right Flank Wound Status: Open Wounding Event: Trauma Comorbid History: Paraplegia Date Acquired: 11/20/2019 Weeks Of Treatment: 12 Clustered Wound: No Photos Photo Uploaded By: Benjaman Kindler on 03/12/2020 14:47:35 Wound Measurements Length: (cm) 0.4 Width: (cm) 0.4 Depth: (cm) 1.7 Area: (cm) 0.126 Volume: (cm) 0.214 % Reduction in Area: 99.3% % Reduction in Volume: 99.3% Epithelialization: None Tunneling: Yes Location 1 Position (o'clock): 12 Maximum Distance: (cm) 3.3 Location 2 Position (o'clock): 2 Maximum Distance: (cm) 8.5 Undermining: No Wound Description Classification: Full Thickness Without Exposed Support Structures Wound Margin: Epibole Exudate Amount: Small Exudate Type: Serosanguineous Exudate Color: red, brown Foul Odor After Cleansing: No Slough/Fibrino Yes Wound Bed Granulation Amount: Large (67-100%) Exposed Structure Granulation Quality: Red Fascia Exposed: No Necrotic Amount: None Present (0%) Fat Layer (Subcutaneous Tissue) Exposed: Yes Tendon Exposed: No Muscle Exposed: No Joint Exposed: No Bone Exposed: No Electronic Signature(s) Signed: 02/28/2020 6:35:32 PM By: Zenaida Deed RN, BSN Entered By: Zenaida Deed on 02/28/2020 08:29:46 -------------------------------------------------------------------------------- Vitals Details Patient Name: Date of Service: [name], [place] [name] 02/28/2020 8:00 A M Medical Record Number: [id number] Patient Account Number: [id number] Date of Birth/Sex: Treating RN: [birth year] (49 y.o. [name] Primary Care Jefry Lesinski: Evelena Leyden Other Clinician: Referring Alecea Trego: Treating Yosiah Jasmin/Extender:  Jerre Simon Weeks in Treatment: 12 Vital Signs Time Taken: 08:10 Temperature (F): 97.6 Height (in): 79 Pulse (bpm):  79 Source: Stated Respiratory Rate (breaths/min): 18 Weight (lbs): 325 Blood Pressure (mmHg): 106/72 Source: Stated Reference Range: 80 - 120 mg / dl Body Mass Index (BMI): 36.6 Electronic Signature(s) Signed: 02/28/2020 6:35:32 PM By: Zenaida Deed RN, BSN Entered By: Zenaida Deed on 02/28/2020 08:14:08

## 2020-03-12 ENCOUNTER — Encounter: Payer: Self-pay | Admitting: Physical Therapy

## 2020-03-12 NOTE — Therapy (Signed)
[facility] [address] [place], [place], [address] Phone: [phone]   Fax:  [phone]  Patient Details  Name: [name] MRN: [id number] Date of Birth: 09-06-1970 Referring Provider:  No ref. provider found  Encounter Date: 03/12/2020  PHYSICAL THERAPY DISCHARGE SUMMARY  Visits from Start of Care: 7  Current functional level related to goals / functional outcomes:  PT Long Term Goals - 09/22/19 1152      PT LONG TERM GOAL #1   Title Pt will be independent with progression of HEP for improved upper extremity and trunk strength, floor<>chair transfers.  TARGET 10/21/2019    Time 4    Period Weeks    Status Revised      PT LONG TERM GOAL #2   Title Pt will report at least 25% improvement in chair<>floor transfers for participation in HEP.    Baseline Pt reports increased difficulty with chair>floor transfers. Performed on 09/20/2019-feels that it has improved (has not done at home this week)    Time 4    Period Weeks    Status On-going      PT LONG TERM GOAL #3   Title Pt will perform at least 12 minutes of continuous seated aerobic activity for improved trunk stability and strength for safe return to w/c basketball activities.    Time 4    Period Weeks    Status Revised      PT LONG TERM GOAL #4   Title Pt will verbalize plans for return to community fitness upon d/c from PT.    Time 4    Period Weeks    Status On-going          At pt's last visit, 0/17/4944, recert completed to fully address LTGs, as progress was being made.  However, pt did not show for remaining visits; unable to reasses LTGs.   Remaining deficits: See recert   Education / Equipment: Educated in [employer]  Plan: Patient agrees to discharge.  Patient goals were not met. Patient is being discharged due to not returning since the last visit.  ?????       Nyzier Boivin W. 03/12/2020, 11:35 AM Frazier Butt., PT [facility] [address] [employer] [place], [place], [address] Phone: [phone]   Fax:  [phone]

## 2020-03-13 ENCOUNTER — Encounter (HOSPITAL_BASED_OUTPATIENT_CLINIC_OR_DEPARTMENT_OTHER): Payer: Medicare Other | Admitting: Internal Medicine

## 2020-03-20 ENCOUNTER — Encounter (HOSPITAL_BASED_OUTPATIENT_CLINIC_OR_DEPARTMENT_OTHER): Payer: Medicare Other | Admitting: Internal Medicine

## 2020-03-27 ENCOUNTER — Encounter (HOSPITAL_BASED_OUTPATIENT_CLINIC_OR_DEPARTMENT_OTHER): Payer: Medicare Other | Admitting: Internal Medicine

## 2021-04-03 DIAGNOSIS — E039 Hypothyroidism, unspecified: Secondary | ICD-10-CM | POA: Diagnosis not present

## 2021-12-18 ENCOUNTER — Emergency Department (HOSPITAL_COMMUNITY): Payer: Medicare Other

## 2021-12-18 ENCOUNTER — Encounter (HOSPITAL_COMMUNITY): Payer: Self-pay

## 2021-12-18 ENCOUNTER — Emergency Department (HOSPITAL_COMMUNITY)
Admission: EM | Admit: 2021-12-18 | Discharge: 2021-12-18 | Disposition: A | Discharge status: Home / Self Care | Payer: Medicare Other | Attending: Emergency Medicine | Admitting: Emergency Medicine

## 2021-12-18 ENCOUNTER — Other Ambulatory Visit: Payer: Self-pay

## 2021-12-18 DIAGNOSIS — M545 Low back pain, unspecified: Secondary | ICD-10-CM | POA: Insufficient documentation

## 2021-12-18 DIAGNOSIS — W19XXXA Unspecified fall, initial encounter: Secondary | ICD-10-CM
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
## 2021-12-18 NOTE — Discharge Instructions (Signed)
Return for any problem.  ?

## 2021-12-24 ENCOUNTER — Other Ambulatory Visit: Payer: Self-pay | Admitting: Registered Nurse

## 2021-12-24 DIAGNOSIS — R0989 Other specified symptoms and signs involving the circulatory and respiratory systems: Secondary | ICD-10-CM

## 2022-01-02 ENCOUNTER — Ambulatory Visit
Admission: RE | Admit: 2022-01-02 | Discharge: 2022-01-02 | Disposition: A | Discharge status: Home / Self Care | Payer: Medicare Other | Source: Ambulatory Visit | Attending: Registered Nurse | Admitting: Registered Nurse

## 2022-01-02 ENCOUNTER — Other Ambulatory Visit: Payer: Self-pay | Admitting: Registered Nurse

## 2022-01-02 DIAGNOSIS — R0989 Other specified symptoms and signs involving the circulatory and respiratory systems: Secondary | ICD-10-CM

## 2022-02-05 ENCOUNTER — Emergency Department (HOSPITAL_BASED_OUTPATIENT_CLINIC_OR_DEPARTMENT_OTHER): Payer: Medicare Other

## 2022-02-05 ENCOUNTER — Emergency Department (HOSPITAL_COMMUNITY): Payer: Medicare Other

## 2022-02-05 ENCOUNTER — Emergency Department (HOSPITAL_COMMUNITY)
Admission: EM | Admit: 2022-02-05 | Discharge: 2022-02-05 | Disposition: A | Discharge status: Home / Self Care | Payer: Medicare Other | Attending: Emergency Medicine | Admitting: Emergency Medicine

## 2022-02-05 ENCOUNTER — Other Ambulatory Visit: Payer: Self-pay

## 2022-02-05 DIAGNOSIS — R600 Localized edema: Secondary | ICD-10-CM | POA: Diagnosis present

## 2022-02-05 DIAGNOSIS — M545 Low back pain, unspecified: Secondary | ICD-10-CM | POA: Diagnosis not present

## 2022-02-05 DIAGNOSIS — M7989 Other specified soft tissue disorders: Secondary | ICD-10-CM

## 2022-02-05 DIAGNOSIS — R609 Edema, unspecified: Secondary | ICD-10-CM

## 2022-02-05 DIAGNOSIS — R109 Unspecified abdominal pain: Secondary | ICD-10-CM | POA: Insufficient documentation

## 2022-02-05 DIAGNOSIS — W01198A Fall on same level from slipping, tripping and stumbling with subsequent striking against other object, initial encounter: Secondary | ICD-10-CM | POA: Diagnosis not present

## 2022-02-05 DIAGNOSIS — M79661 Pain in right lower leg: Secondary | ICD-10-CM | POA: Diagnosis not present

## 2022-02-05 DIAGNOSIS — W19XXXD Unspecified fall, subsequent encounter: Secondary | ICD-10-CM

## 2022-02-05 LAB — CBC WITH DIFFERENTIAL/PLATELET
Abs Immature Granulocytes: 0.03 10*3/uL (ref 0.00–0.07)
Basophils Absolute: 0 10*3/uL (ref 0.0–0.1)
Basophils Relative: 1 %
Eosinophils Absolute: 0.2 10*3/uL (ref 0.0–0.5)
Eosinophils Relative: 3 %
HCT: 34.5 % — ABNORMAL LOW (ref 39.0–52.0)
Hemoglobin: 11 g/dL — ABNORMAL LOW (ref 13.0–17.0)
Immature Granulocytes: 1 %
Lymphocytes Relative: 24 %
Lymphs Abs: 1.2 10*3/uL (ref 0.7–4.0)
MCH: 27.4 pg (ref 26.0–34.0)
MCHC: 31.9 g/dL (ref 30.0–36.0)
MCV: 86 fL (ref 80.0–100.0)
Monocytes Absolute: 0.4 10*3/uL (ref 0.1–1.0)
Monocytes Relative: 8 %
Neutro Abs: 3.3 10*3/uL (ref 1.7–7.7)
Neutrophils Relative %: 63 %
Platelets: 214 10*3/uL (ref 150–400)
RBC: 4.01 MIL/uL — ABNORMAL LOW (ref 4.22–5.81)
RDW: 13.7 % (ref 11.5–15.5)
WBC: 5.1 10*3/uL (ref 4.0–10.5)
nRBC: 0 % (ref 0.0–0.2)

## 2022-02-05 LAB — URINALYSIS, ROUTINE W REFLEX MICROSCOPIC
Bilirubin Urine: NEGATIVE
Glucose, UA: NEGATIVE mg/dL
Hgb urine dipstick: NEGATIVE
Ketones, ur: NEGATIVE mg/dL
Nitrite: POSITIVE — AB
Protein, ur: NEGATIVE mg/dL
Specific Gravity, Urine: 1.014 (ref 1.005–1.030)
pH: 6 (ref 5.0–8.0)

## 2022-02-05 LAB — COMPREHENSIVE METABOLIC PANEL
ALT: 15 U/L (ref 0–44)
AST: 31 U/L (ref 15–41)
Albumin: 3.7 g/dL (ref 3.5–5.0)
Alkaline Phosphatase: 56 U/L (ref 38–126)
Anion gap: 6 (ref 5–15)
BUN: 12 mg/dL (ref 6–20)
CO2: 26 mmol/L (ref 22–32)
Calcium: 8.9 mg/dL (ref 8.9–10.3)
Chloride: 107 mmol/L (ref 98–111)
Creatinine, Ser: 0.88 mg/dL (ref 0.61–1.24)
GFR, Estimated: >60 mL/min (ref >60)
Glucose, Bld: 102 mg/dL — ABNORMAL HIGH (ref 70–99)
Potassium: 4.2 mmol/L (ref 3.5–5.1)
Sodium: 139 mmol/L (ref 135–145)
Total Bilirubin: 1 mg/dL (ref 0.3–1.2)
Total Protein: 6.9 g/dL (ref 6.5–8.1)

## 2022-02-05 MED ORDER — CEPHALEXIN 500 MG PO CAPS: 500.0000 mg | ORAL_CAPSULE | Freq: Four times a day (QID) | ORAL | 0 refills | Status: DC

## 2022-02-05 NOTE — Discharge Instructions (Addendum)
You have been seen today for your complaint of right-sided tightness. Your lab work showed that you have a UTI but was otherwise unremarkable. Your imaging showed no acute traumatic changes or blood clots in your legs. Your discharge medications include Keflex.  This is an antibiotic.  You should take it as prescribed and for the entire duration of the prescription. Home care instructions are as follows:  Continue to perform daily stretching exercises Follow up with: Your primary care provider regarding your lower extremity swelling as soon as possible Please seek immediate medical care if you develop any of the following symptoms:  At this time there does not appear to be the presence of an emergent medical condition, however there is always the potential for conditions to change. Please read and follow the below instructions.  Do not take your medicine if  develop an itchy rash, swelling in your mouth or lips, or difficulty breathing; call 911 and seek immediate emergency medical attention if this occurs.  You may review your lab tests and imaging results in their entirety on your MyChart account.  Please discuss all results of fully with your primary care provider and other specialist at your follow-up visit.  Note: Portions of this text may have been transcribed using voice recognition software. Every effort was made to ensure accuracy; however, inadvertent computerized transcription errors may still be present.
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
## 2022-02-05 NOTE — Progress Notes (Signed)
Lower extremity venous right study completed.  Preliminary results relayed to [place], [place].  See CV Proc for preliminary results report.   Jean Rosenthal, RDMS, RVT

## 2022-03-20 ENCOUNTER — Other Ambulatory Visit: Payer: Self-pay

## 2022-03-20 ENCOUNTER — Observation Stay (HOSPITAL_COMMUNITY)
Admission: EM | Admit: 2022-03-20 | Discharge: 2022-03-21 | Disposition: A | Discharge status: Home / Self Care | Payer: 59 | Attending: Internal Medicine | Admitting: Internal Medicine

## 2022-03-20 ENCOUNTER — Emergency Department (HOSPITAL_BASED_OUTPATIENT_CLINIC_OR_DEPARTMENT_OTHER): Payer: 59

## 2022-03-20 ENCOUNTER — Encounter (HOSPITAL_COMMUNITY): Payer: Self-pay | Admitting: Emergency Medicine

## 2022-03-20 DIAGNOSIS — M7989 Other specified soft tissue disorders: Secondary | ICD-10-CM

## 2022-03-20 DIAGNOSIS — M869 Osteomyelitis, unspecified: Secondary | ICD-10-CM

## 2022-03-20 DIAGNOSIS — M86179 Other acute osteomyelitis, unspecified ankle and foot: Secondary | ICD-10-CM | POA: Diagnosis present

## 2022-03-20 DIAGNOSIS — N319 Neuromuscular dysfunction of bladder, unspecified: Secondary | ICD-10-CM | POA: Diagnosis present

## 2022-03-20 DIAGNOSIS — L538 Other specified erythematous conditions: Secondary | ICD-10-CM

## 2022-03-20 DIAGNOSIS — M86171 Other acute osteomyelitis, right ankle and foot: Principal | ICD-10-CM | POA: Insufficient documentation

## 2022-03-20 DIAGNOSIS — T148XXA Other injury of unspecified body region, initial encounter: Secondary | ICD-10-CM

## 2022-03-20 DIAGNOSIS — G8220 Paraplegia, unspecified: Secondary | ICD-10-CM | POA: Diagnosis present

## 2022-03-20 LAB — CBC WITH DIFFERENTIAL/PLATELET
Abs Immature Granulocytes: 0.02 10*3/uL (ref 0.00–0.07)
Basophils Absolute: 0 10*3/uL (ref 0.0–0.1)
Basophils Relative: 0 %
Eosinophils Absolute: 0.2 10*3/uL (ref 0.0–0.5)
Eosinophils Relative: 4 %
HCT: 36.9 % — ABNORMAL LOW (ref 39.0–52.0)
Hemoglobin: 11.4 g/dL — ABNORMAL LOW (ref 13.0–17.0)
Immature Granulocytes: 0 %
Lymphocytes Relative: 27 %
Lymphs Abs: 1.5 10*3/uL (ref 0.7–4.0)
MCH: 27 pg (ref 26.0–34.0)
MCHC: 30.9 g/dL (ref 30.0–36.0)
MCV: 87.2 fL (ref 80.0–100.0)
Monocytes Absolute: 0.3 10*3/uL (ref 0.1–1.0)
Monocytes Relative: 6 %
Neutro Abs: 3.5 10*3/uL (ref 1.7–7.7)
Neutrophils Relative %: 63 %
Platelets: 218 10*3/uL (ref 150–400)
RBC: 4.23 MIL/uL (ref 4.22–5.81)
RDW: 13.9 % (ref 11.5–15.5)
WBC: 5.6 10*3/uL (ref 4.0–10.5)
nRBC: 0 % (ref 0.0–0.2)

## 2022-03-20 LAB — LACTIC ACID, PLASMA
Lactic Acid, Venous: 2.4 mmol/L (ref 0.5–1.9)
Lactic Acid, Venous: 0.9 mmol/L (ref 0.5–1.9)

## 2022-03-20 LAB — COMPREHENSIVE METABOLIC PANEL
ALT: 17 U/L (ref 0–44)
AST: 31 U/L (ref 15–41)
Albumin: 4.1 g/dL (ref 3.5–5.0)
Alkaline Phosphatase: 65 U/L (ref 38–126)
Anion gap: 10 (ref 5–15)
BUN: 16 mg/dL (ref 6–20)
CO2: 22 mmol/L (ref 22–32)
Calcium: 9.2 mg/dL (ref 8.9–10.3)
Chloride: 107 mmol/L (ref 98–111)
Creatinine, Ser: 0.95 mg/dL (ref 0.61–1.24)
GFR, Estimated: >60 mL/min (ref >60)
Glucose, Bld: 127 mg/dL — ABNORMAL HIGH (ref 70–99)
Potassium: 3.6 mmol/L (ref 3.5–5.1)
Sodium: 139 mmol/L (ref 135–145)
Total Bilirubin: 0.6 mg/dL (ref 0.3–1.2)
Total Protein: 7.8 g/dL (ref 6.5–8.1)

## 2022-03-20 MED ORDER — METRONIDAZOLE 500 MG/100ML IV SOLN: 500.0000 mg | Freq: Two times a day (BID) | INTRAVENOUS | Status: DC

## 2022-03-20 MED ORDER — SODIUM CHLORIDE 0.9 % IV SOLN
2.0000 g | INTRAVENOUS | Status: DC
  Administered 2022-03-20: 2 g via INTRAVENOUS
  Filled 2022-03-20: qty 20

## 2022-03-20 MED ORDER — ONDANSETRON HCL 4 MG/2ML IJ SOLN: 4.0000 mg | Freq: Four times a day (QID) | INTRAMUSCULAR | Status: DC | PRN

## 2022-03-20 MED ORDER — ONDANSETRON HCL 4 MG PO TABS: 4.0000 mg | ORAL_TABLET | Freq: Four times a day (QID) | ORAL | Status: DC | PRN

## 2022-03-20 MED ORDER — VANCOMYCIN HCL 1250 MG/250ML IV SOLN
1250.0000 mg | Freq: Three times a day (TID) | INTRAVENOUS | Status: DC
  Administered 2022-03-21 (×2): 1250 mg via INTRAVENOUS
  Filled 2022-03-20 (×2): qty 250

## 2022-03-20 MED ORDER — ACETAMINOPHEN 325 MG PO TABS: 650.0000 mg | ORAL_TABLET | Freq: Four times a day (QID) | ORAL | Status: DC | PRN

## 2022-03-20 MED ORDER — OXYCODONE HCL 5 MG PO TABS: 5.0000 mg | ORAL_TABLET | ORAL | Status: DC | PRN

## 2022-03-20 MED ORDER — ACETAMINOPHEN 650 MG RE SUPP: 650.0000 mg | Freq: Four times a day (QID) | RECTAL | Status: DC | PRN

## 2022-03-20 MED ORDER — SENNOSIDES-DOCUSATE SODIUM 8.6-50 MG PO TABS: 1.0000 | ORAL_TABLET | Freq: Every evening | ORAL | Status: DC | PRN

## 2022-03-20 MED ORDER — HEPARIN SODIUM (PORCINE) 5000 UNIT/ML IJ SOLN
5000.0000 [IU] | Freq: Three times a day (TID) | INTRAMUSCULAR | Status: DC
  Administered 2022-03-20 – 2022-03-21 (×3): 5000 [IU] via SUBCUTANEOUS
  Filled 2022-03-20 (×3): qty 1

## 2022-03-20 MED ORDER — VANCOMYCIN HCL 2000 MG/400ML IV SOLN
2000.0000 mg | Freq: Once | INTRAVENOUS | Status: AC
  Administered 2022-03-20: 2000 mg via INTRAVENOUS
  Filled 2022-03-20: qty 400
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
## 2022-03-20 NOTE — Progress Notes (Signed)
Lower extremity venous right study completed.  Preliminary results relayed to [place], MD.   See CV Proc for preliminary results report.   Aleeza [place], RDMS, RVT   

## 2022-03-20 NOTE — ED Provider Notes (Signed)
[facility] EMERGENCY DEPARTMENT Provider Note   CSN: [id number] Arrival date & time: 03/20/22  1128     History  Chief Complaint  Patient presents with   Wound Infection    [name] is a 52 y.o. male.  With PMH of morbid obesity, paraplegia sent in by family doctor after outpatient MRI performed 03/18/2022 showed evidence of acute osteomyelitis of the fifth metatarsal base and calcaneus with associated overlying ulcer/cellulitis.  Patient sees [name] at [facility] who has been helping manage his right lower extremity pain and swelling.  He has been developing increased swelling over the past couple of months and told me he had an ultrasound that ruled out DVT however he has chronic ulcers of the posterior foot and lateral ankle that have been getting red and warm.  She ordered an MRI which showed evidence of osteomyelitis which led to him coming into the ER today.  He has not been on any oral antibiotics recently.  He denies any fevers, chills, severe pain or active drainage.  He is wheelchair-bound due to history of paraplegia.  His baseline sensation is decreased in bilateral limbs and he has no movement of either limb.  There is been no change in the sensation or ability to move either limb.  HPI     Home Medications Prior to Admission medications   Medication Sig Start Date End Date Taking? Authorizing Provider  cephALEXin (KEFLEX) 500 MG capsule Take 1 capsule (500 mg total) by mouth 4 (four) times daily. 02/05/22   [name], [name] Folk, PA-C  levothyroxine (SYNTHROID) 25 MCG tablet TAKE 1 TABLET BY MOUTH DAILY BEFORE BREAKFAST. Patient not taking: Reported on 08/22/2019 07/28/19   [name] More, DO  Multiple Vitamin (MULTIVITAMIN) tablet Take 1 tablet by mouth daily. Patient not taking: Reported on 08/22/2019    [provider]  Omega-3 Fatty Acids (FISH OIL PO) Take 1 capsule by mouth daily. Patient not taking: Reported on 08/22/2019     [provider]      Allergies    Patient has no known allergies.    Review of Systems   Review of Systems  Physical Exam Updated Vital Signs BP 133/85 (BP Location: Left Arm)   Pulse 82   Temp 98 F (36.7 C)   Resp 16   Wt (!) 152.9 kg   SpO2 95%   BMI 38.95 kg/m  Physical Exam Constitutional: Alert and oriented.  Pleasant male who is well appearing and in no distress. Eyes: Conjunctivae are normal. ENT      Mouth/Throat: Mucous membranes are moist.      Neck: No stridor. Cardiovascular: S1, S2, regular rate and rhythm, unable to palpate DP pulse of right foot due to overlying edema however foot is warm and dry and capillary refill of toes approximately 2 to 3 seconds and dopplered right DP pulse Respiratory: Normal respiratory effort.  Gastrointestinal: Soft and nondistended Musculoskeletal: Normal range of motion in all extremities.      Right lower leg: 2+ pitting edema extending from foot to knee, no tenderness to palpation, unable to move toes, decreased sensation at baseline, clean edged ulcer of the right lateral foot as well as ulcer of the right posterior calcaneus that can be seen in media with minimal surrounding erythema and no active drainage      Left lower leg: No tenderness or edema. Neurologic: Normal speech and language. No gross focal neurologic deficits are appreciated. Skin: Skin is warm, dry  and intact. No rash noted. Psychiatric: Mood and affect are normal. Speech and behavior are normal.        ED Results / Procedures / Treatments   Labs (all labs ordered are listed, but only abnormal results are displayed) Labs Reviewed  LACTIC ACID, PLASMA - Abnormal; Notable for the following components:      Result Value   Lactic Acid, Venous 2.4 (*)    All other components within normal limits  CBC WITH DIFFERENTIAL/PLATELET - Abnormal; Notable for the following components:   Hemoglobin 11.4 (*)    HCT 36.9 (*)    All other components within  normal limits  COMPREHENSIVE METABOLIC PANEL - Abnormal; Notable for the following components:   Glucose, Bld 127 (*)    All other components within normal limits  CULTURE, BLOOD (ROUTINE X 2)  CULTURE, BLOOD (ROUTINE X 2)  LACTIC ACID, PLASMA    EKG None  Radiology No results found.  Procedures Procedures    Medications Ordered in ED Medications - No data to display  ED Course/ Medical Decision Making/ A&P Clinical Course as of 03/20/22 [name] Mar 20, 2022  1844 S/w [name] who has accepted patient for admission for IV antibiotics and plan for orthopedics to evaluate patient for new acute osteomyelitis of the right foot tomorrow. [VB]    Clinical Course User Index [VB] [name] Congo, MD   {                            Medical Decision Making [name] is a 52 y.o. male.  With PMH of morbid obesity, paraplegia sent in by family doctor after outpatient MRI performed 03/18/2022 showed evidence of acute osteomyelitis of the fifth metatarsal base and calcaneus with associated overlying ulcer/cellulitis.   Patient's lactate 2.4 although doubt sepsis.  He has normal white blood cell count 5.6 and is overall well-appearing not hypotensive, not tachycardic, not febrile.  MRI of the patient right foot obtained outpatient which did show evidence of acute osteomyelitis of the calcaneus with overlying cellulitis as well as acute osteomyelitis of the fifth metatarsal base with overlying ulcer cellulitis with associated tibiotalar joint effusion which is consistent with pitting edema on exam.  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  Will cover patient with broad-spectrum IV antibiotics.  He has dopplerable pulse, no concern for ischemic limb.  No recent injuries, no concern for fracture or dislocation.  Consulted orthopedics [name], plan to cover patient with IV antibiotics at this time and obtain ABI as well as ultrasound of right lower extremity to rule out DVT although  swelling likely reactive from infection.  Patient to be seen by orthopedics tomorrow.  Plan to admit to hospitalist/medicine.  May need repeat inpatient MRI as we are unable to view images from outpatient setting.  S/w [name] who has accepted patient for admission for IV antibiotics and plan for orthopedics to evaluate patient for new acute osteomyelitis of the right foot tomorrow.   Amount and/or Complexity of Data Reviewed External Data Reviewed: radiology.    Details: MRI R foot:" Impression  IMPRESSION: 1.  Acute osteomyelitis of the calcaneus with overlying ulcer/cellulitis. 2.  Acute osteomyelitis of the fifth metatarsal base with overlying ulcer/cellulitis. 3.  Moderate tibiotalar joint effusion. Small posterior subtalar joint effusion. Small talonavicular joint effusion. 4.  No abscess."  Risk Prescription drug management. Decision regarding hospitalization.    Final Clinical Impression(s) / ED  Diagnoses Final diagnoses:  None    Rx / DC Orders ED Discharge Orders     None         Mardene Sayer, MD 03/20/22 [id number]

## 2022-03-20 NOTE — Progress Notes (Signed)
Pharmacy Antibiotic Note  [name] is a 52 y.o. male admitted on 03/20/2022 presenting with heel infection, suspected DFI.  Pharmacy has been consulted for vancomycin dosing.  Plan: Vancomycin 2000 mg IV x 1, then 1250 mg IV q 8h (eAUC 486) Monitor renal function, Cx and clinical progression to narrow Vancomycin levels as indicated   Weight: (!) 152.9 kg (337 lb 1.3 oz)  Temp (24hrs), Avg:98.2 F (36.8 C), Min:98 F (36.7 C), Max:98.4 F (36.9 C)  Recent Labs  Lab 03/20/22 1217  WBC 5.6  CREATININE 0.95  LATICACIDVEN 2.4*    Estimated Creatinine Clearance: 147.5 mL/min (by C-G formula based on SCr of 0.95 mg/dL).    No Known Allergies  Bertis Ruddy, PharmD, [facility] Clinical Pharmacist ED Pharmacist Phone # [phone] 03/20/2022 4:31 PM

## 2022-03-20 NOTE — ED Triage Notes (Signed)
Pt was sent from foot doctor for antibiotics for right heel infection. PT had MRI 2 days ago and doctor called and advised to come to ED. Pt complains of foot spasms.

## 2022-03-20 NOTE — ED Provider Triage Note (Signed)
Emergency Medicine Provider Triage Evaluation Note  [name] , a 52 y.o. male  was evaluated in triage.  Pt complains of right foot wound. Patient was sent here from PCP after confirmed osteo on MRI. Denies any fevers. Reports this is a chronic wound  Review of Systems  Positive:  Negative:   Physical Exam  BP (!) 172/102 (BP Location: Right Arm)   Pulse 87   Temp 98.4 F (36.9 C) (Oral)   Resp 16   Wt (!) 152.9 kg   SpO2 93%   BMI 38.95 kg/m  Gen:   Awake, no distress   Resp:  Normal effort  MSK:   Moves extremities without difficulty  Other:  Foot brace with sock and dressing applied.   Medical Decision Making  Medically screening exam initiated at 12:17 PM.  Appropriate orders placed.  [name] was informed that the remainder of the evaluation will be completed by another provider, this initial triage assessment does not replace that evaluation, and the importance of remaining in the ED until their evaluation is complete.  Labs and antibiotics ordered. Patient does not meet criteria for sepsis off vital signs.   MRI on 03/18/22  1.  Acute osteomyelitis of the calcaneus with overlying ulcer/cellulitis.  2.  Acute osteomyelitis of the fifth metatarsal base with overlying ulcer/cellulitis.  3.  Moderate tibiotalar joint effusion. Small posterior subtalar joint effusion. Small talonavicular joint effusion.  4.  No abscess.    Sherrell Puller, [place] 03/20/22 1219

## 2022-03-21 DIAGNOSIS — M86171 Other acute osteomyelitis, right ankle and foot: Secondary | ICD-10-CM | POA: Diagnosis not present

## 2022-03-21 DIAGNOSIS — T148XXA Other injury of unspecified body region, initial encounter: Secondary | ICD-10-CM

## 2022-03-21 DIAGNOSIS — M86179 Other acute osteomyelitis, unspecified ankle and foot: Secondary | ICD-10-CM | POA: Diagnosis present

## 2022-03-21 LAB — CBC
HCT: 31.5 % — ABNORMAL LOW (ref 39.0–52.0)
Hemoglobin: 10 g/dL — ABNORMAL LOW (ref 13.0–17.0)
MCH: 27.3 pg (ref 26.0–34.0)
MCHC: 31.7 g/dL (ref 30.0–36.0)
MCV: 86.1 fL (ref 80.0–100.0)
Platelets: 165 10*3/uL (ref 150–400)
RBC: 3.66 MIL/uL — ABNORMAL LOW (ref 4.22–5.81)
RDW: 13.8 % (ref 11.5–15.5)
WBC: 3.6 10*3/uL — ABNORMAL LOW (ref 4.0–10.5)
nRBC: 0 % (ref 0.0–0.2)

## 2022-03-21 LAB — BASIC METABOLIC PANEL
Anion gap: 7 (ref 5–15)
BUN: 11 mg/dL (ref 6–20)
CO2: 26 mmol/L (ref 22–32)
Calcium: 8.6 mg/dL — ABNORMAL LOW (ref 8.9–10.3)
Chloride: 109 mmol/L (ref 98–111)
Creatinine, Ser: 0.82 mg/dL (ref 0.61–1.24)
GFR, Estimated: >60 mL/min (ref >60)
Glucose, Bld: 100 mg/dL — ABNORMAL HIGH (ref 70–99)
Potassium: 3.7 mmol/L (ref 3.5–5.1)
Sodium: 142 mmol/L (ref 135–145)

## 2022-03-21 LAB — C-REACTIVE PROTEIN: CRP: 0.6 mg/dL (ref <1.0)

## 2022-03-21 LAB — SEDIMENTATION RATE: Sed Rate: 42 mm/hr — ABNORMAL HIGH (ref 0–16)

## 2022-03-21 LAB — HIV ANTIBODY (ROUTINE TESTING W REFLEX): HIV Screen 4th Generation wRfx: NONREACTIVE

## 2022-03-21 NOTE — TOC CM/SW Note (Signed)
Durable Medical Equipment  (From admission, onward)           Start     Ordered   03/21/22 1309  For home use only DME Hospital bed  Once       Question Answer Comment  Length of Need Lifetime   Patient has (list medical condition): Right foot osteomyelitis,  T5 paraplegia   The above medical condition requires: Patient requires the ability to reposition frequently   Head must be elevated greater than: 45 degrees   Bed type Semi-electric   Support Surface: Low Air loss Mattress      03/21/22 1308
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
## 2022-03-21 NOTE — Care Management CC44 (Signed)
Condition Code 44 Documentation Completed  Patient Details  Name: [name] MRN: [id number] Date of Birth: 02-27-1971   Condition Code 44 given:  Yes Patient signature on Condition Code 44 notice:  Yes Documentation of 2 MD's agreement:  Yes Code 44 added to claim:  Yes    Marilu Favre, RN 03/21/2022, 2:04 PM
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
## 2022-03-21 NOTE — Progress Notes (Signed)
Pt discharged to home later in shift by Prentiss Bells, Therapist, sports. See RN's note for details.

## 2022-03-21 NOTE — TOC Initial Note (Addendum)
Transition of Care [facility]) - Initial/Assessment Note    Patient Details  Name: [name] MRN: [id number] Date of Birth: April 21, 1970  Transition of Care [facility]) CM/SW Contact:    [name] Favre, RN Phone Number: 03/21/2022, 1:10 PM  Clinical Narrative:                  Patient from home with daughter and grand daughter    PCP is [name] at [facility] [employer]  Patient already has an appointment at [facility] April 01, 2022 at 0800 am , information on AVS .   Patient has wheel chair already.   Ordered hospital bed and low air loss mattress for home. Confirmed address and his contact number to arrange delivery. Ordered with [name] with [facility].   Bedside nurse will provide wound care education and some dressing supplies.   Patient has transportation home at discharge. Patient aware discharge today   Expected Discharge Plan: Home/Self Care Barriers to Discharge: No Barriers Identified   Patient Goals and CMS Choice Patient states their goals for this hospitalization and ongoing recovery are:: to return to home          Expected Discharge Plan and Services   Discharge Planning Services: CM Consult   Living arrangements for the past 2 months: Single Family Home Expected Discharge Date: 03/21/22               DME Arranged: Hospital bed (low air loss mattress) DME Agency: [employer] Date DME Agency Contacted: 03/21/22 Time DME Agency Contacted: 64 Representative spoke with at DME Agency: [place]: NA          Prior Living Arrangements/Services Living arrangements for the past 2 months: Single Family Home Lives with:: Adult Children Patient language and need for interpreter reviewed:: Yes Do you feel safe going back to the place where you live?: Yes      Need for Family Participation in Patient Care: Yes (Comment) Care giver support system in place?: Yes (comment) Current home services: DME Criminal Activity/Legal  Involvement Pertinent to Current Situation/Hospitalization: No - Comment as needed  Activities of Daily Living Home Assistive Devices/Equipment: Wheelchair, Reacher, Grab bars around toilet, Grab bars in shower, Hand-held shower hose, Eyeglasses ADL Screening (condition at time of admission) Patient's cognitive ability adequate to safely complete daily activities?: Yes Is the patient deaf or have difficulty hearing?: Yes Does the patient have difficulty seeing, even when wearing glasses/contacts?: Yes Does the patient have difficulty concentrating, remembering, or making decisions?: Yes Patient able to express need for assistance with ADLs?: No Does the patient have difficulty dressing or bathing?: No Independently performs ADLs?: Yes (appropriate for developmental age) Does the patient have difficulty walking or climbing stairs?: Yes Weakness of Legs: Both Weakness of Arms/Hands: None  Permission Sought/Granted   Permission granted to share information with : No              Emotional Assessment Appearance:: Appears stated age Attitude/Demeanor/Rapport: Engaged Affect (typically observed): Accepting Orientation: : Oriented to Self, Oriented to Place, Oriented to  Time, Oriented to Situation Alcohol / Substance Use: Not Applicable Psych Involvement: No (comment)  Admission diagnosis:  Acute osteomyelitis of right foot ([place]) [M86.171] Osteomyelitis of right foot, unspecified type [facility]) [M86.9] Patient Active Problem List   Diagnosis Date Noted   Chronic wound 03/21/2022   Acute osteomyelitis of right foot ([place]) 03/20/2022   Special screening for malignant neoplasms, colon 06/28/2019   Anemia 05/04/2019  Morbid obesity ([place]) 05/04/2019   Hypothyroidism 08/06/2012   Paraplegia ([place]) 08/06/2012   Neurogenic bladder 11/04/2011   Urethral stricture 05/05/2011   PCP:  Pcp, No Pharmacy:   [id number] [place], [address] [place] [address] Phone: [phone] Fax: [phone]  [id number] - [place], [place] [address] Phone: [phone] Fax: [phone]     Social Determinants of Health (SDOH) Social History: SDOH Screenings   Food Insecurity: No Food Insecurity (03/20/2022)  Housing: Low Risk  (03/20/2022)  Transportation Needs: No Transportation Needs (03/20/2022)  Utilities: Not At Risk (03/20/2022)  Depression (PHQ2-9): Medium Risk (05/04/2019)  Tobacco Use: Low Risk  (03/20/2022)   SDOH Interventions:     Readmission Risk Interventions     No data to display

## 2022-03-21 NOTE — Progress Notes (Signed)
RN gave patient DC instructions and he stated understanding. IV has been removed pt given supplies for dressing change. No new medications.

## 2022-03-21 NOTE — Care Management Obs Status (Signed)
[place] NOTIFICATION   Patient Details  Name: [name] MRN: [id number] Date of Birth: Apr 28, 1970   Medicare Observation Status Notification Given:  Yes    Marilu Favre, RN 03/21/2022, 2:04 PM

## 2022-03-25 LAB — CULTURE, BLOOD (ROUTINE X 2)
Culture: NO GROWTH
Culture: NO GROWTH
Special Requests: ADEQUATE

## 2022-04-01 ENCOUNTER — Encounter (HOSPITAL_BASED_OUTPATIENT_CLINIC_OR_DEPARTMENT_OTHER): Payer: 59 | Attending: Internal Medicine | Admitting: Internal Medicine

## 2022-04-10 ENCOUNTER — Other Ambulatory Visit: Payer: Self-pay

## 2022-04-10 ENCOUNTER — Encounter: Payer: Self-pay | Admitting: Internal Medicine

## 2022-04-10 ENCOUNTER — Ambulatory Visit (INDEPENDENT_AMBULATORY_CARE_PROVIDER_SITE_OTHER): Payer: 59 | Admitting: Internal Medicine

## 2022-04-10 VITALS — BP 145/79 | HR 88 | Temp 96.1°F

## 2022-04-10 DIAGNOSIS — L89613 Pressure ulcer of right heel, stage 3: Secondary | ICD-10-CM

## 2022-04-10 DIAGNOSIS — G8220 Paraplegia, unspecified: Secondary | ICD-10-CM

## 2022-04-10 NOTE — Patient Instructions (Signed)
You have finished basically a course of bone infection treatment with antibiotics  Again I am unclear if you actually have bone infection, but regardless we can stop antibiotics today   Please send me mychart of the antibiotics that you took so I can document   See wound care for ongoing care. If there is concern of new bone infection, I would advise to have the bone debrided and sent for culture to confirm diagnosis and to guide antibiotics choice   See me as needed

## 2022-04-10 NOTE — Progress Notes (Signed)
[place] for Infectious Disease  Patient Active Problem List   Diagnosis Date Noted   Chronic wound 03/21/2022   Acute osteomyelitis of foot ([place]) 03/21/2022   Acute osteomyelitis of right foot ([place]) 03/20/2022   Special screening for malignant neoplasms, colon 06/28/2019   Anemia 05/04/2019   Morbid obesity ([place]) 05/04/2019   Hypothyroidism 08/06/2012   Paraplegia ([place]) 08/06/2012   Neurogenic bladder 11/04/2011   Urethral stricture 05/05/2011      Subjective:    Patient ID: [name], male    DOB: April 25, 1970, 52 y.o.   MRN: [id number]  Chief Complaint  Patient presents with   Hospitalization Follow-up    HPI:  [name] is a 52 y.o. male paraplegic (not diabetic) with decub right heel ulcer with mri suggestion osteomyelitis here for hospital f/u   I saw him in [place] on 03/20/22. He had mri that suggest calcaneal om Lab Results  Component Value Date   CREATININE 0.82 03/21/2022   Lab Results  Component Value Date   CRP 0.6 03/21/2022   Lab Results  Component Value Date   ESRSEDRATE 42 (H) 03/21/2022    His crp however was normal, so given the nonspecificity and also lack of good wound care/bone culture, we decide to continue monitoring off abx  If crp rises or imaging progression of bone destruciton, or if bone culture and I&D available and confirm osteomyelitis then reasonable to try abx at that time  He is here today 04/10/22 for first id clinic f/u His pcp had given him a prescription for abx which he had been taking since even before admission 2-3 weeks and still continues to take it up until today's visit  He doesn't remember the antibiotics name but said he takes it once a day and only one antibiotics   He said his open wound is smaller -- he showed me picture in hospital 1-2 cm stage 3 looking wound clean based  No Known Allergies    Outpatient Medications Prior to Visit  Medication Sig Dispense Refill   atorvastatin  (LIPITOR) 10 MG tablet Take 10 mg by mouth in the morning.     collagenase (SANTYL) 250 UNIT/GM ointment Apply 1 Application topically in the morning.     levothyroxine (SYNTHROID) 25 MCG tablet TAKE 1 TABLET BY MOUTH DAILY BEFORE BREAKFAST. 90 tablet 0   Omega-3 Fatty Acids (FISH OIL PO) Take 1 capsule by mouth in the morning.     No facility-administered medications prior to visit.     Social History   Socioeconomic History   Marital status: Single    Spouse name: Not on file   Number of children: Not on file   Years of education: Not on file   Highest education level: Not on file  Occupational History   Not on file  Tobacco Use   Smoking status: Never   Smokeless tobacco: Never  Vaping Use   Vaping Use: Never used  Substance and Sexual Activity   Alcohol use: No   Drug use: No   Sexual activity: Not on file  Other Topics Concern   Not on file  Social History Narrative   Not on file   Social Determinants of Health   Financial Resource Strain: Not on file  Food Insecurity: No Food Insecurity (03/20/2022)   Hunger Vital Sign    Worried About Running Out of Food in the Last Year: Never true    Ran Out of  Food in the Last Year: Never true  Transportation Needs: No Transportation Needs (03/20/2022)   PRAPARE - Hydrologist (Medical): No    Lack of Transportation (Non-Medical): No  Physical Activity: Not on file  Stress: Not on file  Social Connections: Not on file  Intimate Partner Violence: Not At Risk (03/20/2022)   Humiliation, Afraid, Rape, and Kick questionnaire    Fear of Current or Ex-Partner: No    Emotionally Abused: No    Physically Abused: No    Sexually Abused: No      Review of Systems    All other ros negative   Objective:    BP (!) 145/79   Pulse 88   Temp (!) 96.1 F (35.6 C) (Temporal)  Nursing note and vital signs reviewed.  Physical Exam     General/constitutional: no distress, pleasant HEENT:  Normocephalic, PER, Conj Clear, EOMI, Oropharynx clear Neck supple CV: rrr no mrg Lungs: clear to auscultation, normal respiratory effort Abd: Soft, Nontender Ext: no edema Skin: No Rash Neuro: paraplegic; right foot dressing c/d/I    Labs: Lab Results  Component Value Date   WBC 3.6 (L) 03/21/2022   HGB 10.0 (L) 03/21/2022   HCT 31.5 (L) 03/21/2022   MCV 86.1 03/21/2022   PLT 165 27/78/2423   Last metabolic panel Lab Results  Component Value Date   GLUCOSE 100 (H) 03/21/2022   NA 142 03/21/2022   K 3.7 03/21/2022   CL 109 03/21/2022   CO2 26 03/21/2022   BUN 11 03/21/2022   CREATININE 0.82 03/21/2022   GFRNONAA >60 03/21/2022   CALCIUM 8.6 (L) 03/21/2022   PHOS 2.7 05/12/2009   PROT 7.8 03/20/2022   ALBUMIN 4.1 03/20/2022   BILITOT 0.6 03/20/2022   ALKPHOS 65 03/20/2022   AST 31 03/20/2022   ALT 17 03/20/2022   ANIONGAP 7 03/21/2022   Lab Results  Component Value Date   ESRSEDRATE 42 (H) 03/21/2022   Lab Results  Component Value Date   CRP 0.6 03/21/2022    Micro:  Serology:  Imaging:  Assessment & Plan:   Problem List Items Addressed This Visit       Nervous and Auditory   Paraplegia ([place])   Relevant Orders   CBC   COMPLETE METABOLIC PANEL WITH GFR   C-reactive protein   Other Visit Diagnoses     Pressure injury of right heel, stage 3 (HCC)    -  Primary   Relevant Orders   CBC   COMPLETE METABOLIC PANEL WITH GFR   C-reactive protein         No orders of the defined types were placed in this encounter.    Decub ulcer right heel with mri suggestion on 03/21/22 of om Has been on some abx empirically for 5 weeks as of today's visit 04/10/22, so basically finished an empiric om treatment   He'll start to see outpatient wound care  Not diabetic   Advise ongoing wound care and if further treatment with abx indicated, he'll need bone I&D and culture. Would avoid empiric abx especially given prior treatment and also difficult nature  of heel OM to be cured  F/u as needed   Follow-up: Return if symptoms worsen or fail to improve.      Jabier Mutton, [place] for Infectious Disease [facility] 04/10/2022, 3:43 PM

## 2022-04-11 LAB — COMPLETE METABOLIC PANEL WITH GFR
AG Ratio: 1.5 (calc) (ref 1.0–2.5)
ALT: 13 U/L (ref 9–46)
AST: 16 U/L (ref 10–35)
Albumin: 4.3 g/dL (ref 3.6–5.1)
Alkaline phosphatase (APISO): 66 U/L (ref 35–144)
BUN: 14 mg/dL (ref 7–25)
CO2: 28 mmol/L (ref 20–32)
Calcium: 9.4 mg/dL (ref 8.6–10.3)
Chloride: 108 mmol/L (ref 98–110)
Creat: 0.79 mg/dL (ref 0.70–1.30)
Globulin: 2.9 g/dL (calc) (ref 1.9–3.7)
Glucose, Bld: 107 mg/dL — ABNORMAL HIGH (ref 65–99)
Potassium: 4.2 mmol/L (ref 3.5–5.3)
Sodium: 143 mmol/L (ref 135–146)
Total Bilirubin: 0.4 mg/dL (ref 0.2–1.2)
Total Protein: 7.2 g/dL (ref 6.1–8.1)
eGFR: 106 mL/min/{1.73_m2} (ref >=60)

## 2022-04-11 LAB — CBC
HCT: 32.9 % — ABNORMAL LOW (ref 38.5–50.0)
Hemoglobin: 10.6 g/dL — ABNORMAL LOW (ref 13.2–17.1)
MCH: 27.2 pg (ref 27.0–33.0)
MCHC: 32.2 g/dL (ref 32.0–36.0)
MCV: 84.4 fL (ref 80.0–100.0)
MPV: 11.8 fL (ref 7.5–12.5)
Platelets: 222 10*3/uL (ref 140–400)
RBC: 3.9 10*6/uL — ABNORMAL LOW (ref 4.20–5.80)
RDW: 13.2 % (ref 11.0–15.0)
WBC: 5.1 10*3/uL (ref 3.8–10.8)

## 2022-04-11 LAB — C-REACTIVE PROTEIN: CRP: 4 mg/L (ref <8.0)

## 2022-04-28 ENCOUNTER — Encounter (HOSPITAL_BASED_OUTPATIENT_CLINIC_OR_DEPARTMENT_OTHER): Payer: 59 | Attending: General Surgery | Admitting: General Surgery

## 2022-04-28 DIAGNOSIS — M86171 Other acute osteomyelitis, right ankle and foot: Secondary | ICD-10-CM | POA: Insufficient documentation

## 2022-04-28 DIAGNOSIS — G8220 Paraplegia, unspecified: Secondary | ICD-10-CM | POA: Diagnosis not present

## 2022-04-28 DIAGNOSIS — E039 Hypothyroidism, unspecified: Secondary | ICD-10-CM | POA: Insufficient documentation

## 2022-04-28 DIAGNOSIS — L97512 Non-pressure chronic ulcer of other part of 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: Secondary | ICD-10-CM | POA: Diagnosis not present

## 2022-04-28 DIAGNOSIS — L97412 Non-pressure chronic ulcer of right heel and midfoot with fat layer exposed: Secondary | ICD-10-CM | POA: Insufficient documentation

## 2022-04-29 NOTE — Progress Notes (Signed)
[name], [name] ([id number]) [phone] [id number] Page 1 of 4 Visit Report for 04/28/2022 Abuse Risk Screen Details Patient Name: Date of Service: [name], [name] 04/28/2022 9:00 [place] Record Number: [id number] Patient Account Number: [id number] Date of Birth/Sex: Treating RN: 1970/11/14 (52 y.o. [name] Primary Care Dawnn Nam: Arthur Holms Other Clinician: Referring Ryin Ambrosius: Treating Mersades Barbaro/Extender: [name] in Treatment: 0 Abuse Risk Screen Items Answer ABUSE RISK SCREEN: Has anyone close to you tried to hurt or harm you recentlyo No Do you feel uncomfortable with anyone in your familyo No Has anyone forced you do things that you didnt want to doo No Electronic Signature(s) Signed: 04/28/2022 5:30:43 PM By: Baruch Gouty RN, BSN Entered By: Baruch Gouty on 04/28/2022 09:33:47 -------------------------------------------------------------------------------- Activities of Daily Living Details Patient Name: Date of Service: [name], [place]. 04/28/2022 9:00 [place] Record Number: [id number] Patient Account Number: [id number] Date of Birth/Sex: Treating RN: [birth year] (52 y.o. [name] Primary Care [name]: Arthur Holms Other Clinician: Referring Olin Gurski: Treating Solstice Lastinger/Extender: [name] in Treatment: 0 Activities of Daily Living Items Answer Activities of Daily Living (Please select one for each item) Drive Automobile Completely Able T Medications ake Completely Able Use T elephone Completely Able Care for Appearance Completely Able Use T oilet Completely Able Bath / Shower Completely Able Dress Self Completely Able Feed Self Completely Able Walk Not Able Get In / Out Bed Completely Able Housework Completely Able Prepare Meals Completely [place] for Self Need Assistance Electronic Signature(s) Signed: 04/28/2022 5:30:43 PM By:  Baruch Gouty RN, BSN Entered By: Baruch Gouty on 04/28/2022 09:34:17 -------------------------------------------------------------------------------- Education Screening Details Patient Name: Date of Service: [name], [place]. 04/28/2022 9:00 [place] Record Number: [id number] Patient Account Number: [id number] Date of Birth/Sex: Treating RN: 1970/11/27 (52 y.o. [name] Primary Care Carry Ortez: Arthur Holms Other Clinician: Referring Abagayle Klutts: Treating Lovely Kerins/Extender: [name] in Treatment: 0 [name], [name] ([id number]) [id number] Page 2 of 4 Primary Learner Assessed: Patient Learning Preferences/Education Level/Primary Language Learning Preference: Explanation, Demonstration, Printed Material Highest Education Level: High School Preferred Language: Diplomatic Services operational officer Language Barrier: No Translator Needed: No Memory Deficit: No Emotional Barrier: No Cultural/Religious Beliefs Affecting Medical Care: No Physical Barrier Impaired Vision: No Impaired Hearing: No Decreased Hand dexterity: No Knowledge/Comprehension Knowledge Level: High Comprehension Level: High Ability to understand written instructions: High Ability to understand verbal instructions: High Motivation Anxiety Level: Calm Cooperation: Cooperative Education Importance: Acknowledges Need Interest in Health Problems: Asks Questions Perception: Coherent Willingness to Engage in Self-Management High Activities: Readiness to Engage in Self-Management High Activities: Electronic Signature(s) Signed: 04/28/2022 5:30:43 PM By: Baruch Gouty RN, BSN Entered By: Baruch Gouty on 04/28/2022 09:34:50 -------------------------------------------------------------------------------- Fall Risk Assessment Details Patient Name: Date of Service: [name], [name] 04/28/2022 9:00 A M Medical Record Number: [id number] Patient Account  Number: [id number] Date of Birth/Sex: Treating RN: [birth year] (52 y.o. [name] Primary Care Kalanie Fewell: Arthur Holms Other Clinician: Referring Adara Kittle: Treating Kasiya Burck/Extender: [name] in Treatment: 0 Fall Risk Assessment Items Have you had 2 or more falls in the last 12 monthso 0 No Have you had any fall that resulted in injury in the last 12 monthso 0 No FALLS RISK SCREEN History of falling - immediate or within 3 months 0 No Secondary diagnosis (Do you have 2 or more medical diagnoseso) 0 No Ambulatory  aid None/bed rest/wheelchair/nurse 0 Yes Crutches/cane/walker 0 No Furniture 0 No Intravenous therapy Access/Saline/Heparin Lock 0 No Gait/Transferring Normal/ bed rest/ wheelchair 0 Yes Weak (short steps with or without shuffle, stooped but able to lift head while walking, may seek 0 No support from furniture) Impaired (short steps with shuffle, may have difficulty arising from chair, head down, impaired 0 No balance) Mental Status Oriented to own ability 0 Yes Overestimates or forgets limitations 0 No Risk Level: Low Risk Score: 0 [name], [name] L ([id number]) [id number] Page 3 of 4 Electronic Signature(s) -------------------------------------------------------------------------------- Foot Assessment Details Patient Name: Date of Service: [name], [name] 04/28/2022 9:00 [place] Record Number: [id number] Patient Account Number: [id number] Date of Birth/Sex: Treating RN: 1970-08-18 (52 y.o. [name] Primary Care Prayan Ulin: Arthur Holms Other Clinician: Referring Janete Quilling: Treating Eli Pattillo/Extender: [name] in Treatment: 0 Foot Assessment Items Site Locations + = Sensation present, - = Sensation absent, C = Callus, U = Ulcer R = Redness, W = Warmth, M = Maceration, PU = Pre-ulcerative lesion F = Fissure, S = Swelling, D = Dryness Assessment Right: Left: Other  Deformity: No No Prior Foot Ulcer: Yes No Prior Amputation: No No Charcot Joint: No No Ambulatory Status: Non-ambulatory Assistance Device: Wheelchair Gait: Engineer, maintenance) Signed: 04/28/2022 5:30:43 PM By: Baruch Gouty RN, BSN Entered By: Baruch Gouty on 04/28/2022 09:40:54 -------------------------------------------------------------------------------- Nutrition Risk Screening Details Patient Name: Date of Service: [name], [name] 04/28/2022 9:00 A M Medical Record Number: [id number] Patient Account Number: [id number] Date of Birth/Sex: Treating RN: 1970-10-27 (52 y.o. [name] Primary Care Deshanna Kama: Arthur Holms Other Clinician: Referring Shenique Childers: Treating Yohann Curl/Extender: [name] in Treatment: 0 Height (in): 78 Weight (lbs): 336 Body Mass Index (BMI): 38.8 Skow, Derrich L ([id number]) [id number] Page 4 of 4 Nutrition Risk Screening Items Score Screening NUTRITION RISK SCREEN: I have an illness or condition that made me change the kind and/or amount of food I eat 0 No I eat fewer than two meals per day 0 No I eat few fruits and vegetables, or milk products 0 No I have three or more drinks of beer, liquor or wine almost every day 0 No I have tooth or mouth problems that make it hard for me to eat 0 No I don't always have enough money to buy the food I need 0 No I eat alone most of the time 0 No I take three or more different prescribed or over-the-counter drugs a day 1 Yes Without wanting to, I have lost or gained 10 pounds in the last six months 0 No I am not always physically able to shop, cook and/or feed myself 0 No Nutrition Protocols Good Risk Protocol 0 No interventions needed Moderate Risk Protocol High Risk Proctocol Risk Level: Good Risk Score: 1 Electronic Signature(s) Signed: 04/28/2022 5:30:43 PM By: Baruch Gouty RN, BSN Entered By: Baruch Gouty on 04/28/2022  09:35:45
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
## 2022-04-29 NOTE — Progress Notes (Signed)
[name] [name], [name] [name] ([id number] Page 1 of 7 Visit Report for 04/28/2022 Allergy List Details Patient Name: Date of Service: [name] [name] IS, [place] YNE [name]. 04/28/2022 9:00 [place] [name] Record Number: [id number] Patient Account Number: [id number] Date of Birth/Sex: Treating RN: [name] [name] (52 y.o. [name] [name] Primary Care [name] [name]: [name] [name] Other Clinician: Referring [name] [name]: Treating [name] [name]/Extender: [name] [name]: 0 Allergies Active Allergies levothyroxine Reaction: lethargy Allergy Notes Electronic Signature(s) Signed: 04/28/2022 5:30:43 PM By: Baruch Gouty RN, BSN Entered By: Baruch [name] on [name] 09:56:41 -------------------------------------------------------------------------------- Arrival Information Details Patient Name: Date of Service: [name] [name]. 04/28/2022 9:00 A M Medical Record Number: [id number] Patient Account Number: [id number] Date of Birth/Sex: Treating RN: [name] [name] (52 y.o. [name] [name] Primary Care Katrese Shell: [name] [name] Other Clinician: Referring [name] [name]: Treating [name] [name]/Extender: [name] [name] in [name]: 0 Visit Information Patient Arrived: Wheel Chair Arrival Time: 08:19 Accompanied By: self Transfer Assistance: None Patient Has Alerts: Yes Patient Alerts: R ABI= 1.16, [name] ABI= 1.09 History Since Last Visit Electronic Signature(s) Signed: 04/28/2022 5:30:43 PM By: Baruch Gouty RN, BSN Entered By: Baruch [name] on [name] 09:28:09 -------------------------------------------------------------------------------- Clinic Level of Care Assessment Details Patient Name: Date of Service: [name] [name], [name] 04/28/2022 9:00 A M Medical Record Number: [id number] Patient Account Number: [id number] Date of Birth/Sex: Treating RN: [name] [name] (52 y.o. [name] [name] Primary Care Elzora Cullins: [name] [name] Other Clinician: Referring [name] [name]: Treating  [name] [name]/Extender: [name] [name] in [name]: 0 Clinic Level of Care Assessment Items TOOL 1 Quantity Score '[]'$  - 0 Use when [name] [name] and Procedure is performed on INITIAL visit ASSESSMENTS - Nursing Assessment / Reassessment X- 1 20 General Physical Exam (combine w/ comprehensive assessment (listed just below) when performed on new pt. evals) X- 1 25 Comprehensive Assessment (HX, ROS, Risk Assessments, Wounds Hx, etc.) ASSESSMENTS - Wound and Skin Assessment / Reassessment [name] [name], [name] [name] ([id number] Page 2 of 7 '[]'$  - 0 Dermatologic / Skin Assessment (not related to wound area) ASSESSMENTS - Ostomy and/or Continence Assessment and Care '[]'$  - 0 Incontinence Assessment and Management '[]'$  - 0 Ostomy Care Assessment and Management (repouching, etc.) PROCESS - Coordination of Care X - Simple Patient / Family Education for ongoing care 1 15 '[]'$  - 0 Complex (extensive) Patient / Family Education for ongoing care X- 1 10 Staff obtains Programmer, systems, Records, T Results / Process Orders est '[]'$  - 0 Staff telephones HHA, Nursing Homes / Clarify orders / etc '[]'$  - 0 Routine Transfer to another Facility (non-emergent condition) '[]'$  - 0 Routine Hospital Admission (non-emergent condition) X- 1 15 New Admissions / Biomedical engineer / Ordering NPWT Apligraf, etc. , '[]'$  - 0 Emergency Hospital Admission (emergent condition) PROCESS - Special Needs '[]'$  - 0 Pediatric / Minor Patient Management '[]'$  - 0 Isolation Patient Management '[]'$  - 0 Hearing / Language / Visual special needs '[]'$  - 0 Assessment of Community assistance (transportation, D/C planning, etc.) '[]'$  - 0 Additional assistance / Altered mentation '[]'$  - 0 Support Surface(s) Assessment (bed, cushion, seat, etc.) INTERVENTIONS - Miscellaneous '[]'$  - 0 External ear exam '[]'$  - 0 Patient Transfer (multiple staff / Civil Service fast streamer / Similar devices) '[]'$  - 0 Simple Staple / Suture removal (25  or less) '[]'$  - 0 Complex Staple / Suture removal (26 or more) '[]'$  - 0 Hypo/Hyperglycemic Management (do not check if billed separately) '[]'$  - 0 Ankle / Brachial Index (ABI) - do not check if billed separately Has  the patient been seen at the hospital within the last three years: Yes Total Score: 85 Level Of Care: New/Established - Level 3 Electronic Signature(s) Signed: 04/28/2022 5:30:43 PM By: Baruch Gouty RN, BSN Entered By: Baruch [name] on [name] 10:28:25 -------------------------------------------------------------------------------- Lower Extremity Assessment Details Patient Name: Date of Service: [name] [name]. 04/28/2022 9:00 A M Medical Record Number: [id number] Patient Account Number: [id number] Date of Birth/Sex: Treating RN: [birth year] (52 y.o. [name] [name] Primary Care [name] [name]: [name] [name] Other Clinician: Referring [name] [name]: Treating [name] [name]/Extender: [name] [name] in [name]: 0 Edema Assessment Assessed: [Left: No] [Right: No] Edema: [Left: Ye] [Right: s] Calf Left: Right: Point of Measurement: From Medial Instep 40 cm Ankle Pelly, [name] [name] ([name] [name] Page 3 of 7 Left: Right: Point of Measurement: From Medial Instep 32 cm Vascular Assessment Pulses: Dorsalis Pedis Palpable: [Right:No] Electronic Signature(s) Signed: 04/28/2022 5:30:43 PM By: Baruch Gouty RN, BSN Entered By: Baruch [name] on [name] 09:42:04 -------------------------------------------------------------------------------- Multi Wound Chart Details Patient Name: Date of Service: [name] [name]. 04/28/2022 9:00 [place] Record Number: [id number] Patient Account Number: [id number] Date of Birth/Sex: Treating RN: Dec [birth year], [name] (52 y.o. 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 Primary Etiology: Anemia, Osteomyelitis, Paraplegia Anemia, Osteomyelitis, Paraplegia N/A Comorbid History: 11/15/2021 11/15/2021 N/A Date Acquired: 0 0 N/A Weeks of [name]: Open Open N/A Wound Status: No No N/A Wound Recurrence: 1x1.6x1.1 1.7x1.3x0.2 N/A Measurements [name] x W x D (cm) 1.257 1.736 N/A A (cm) : rea 1.382 0.347 N/A Volume (cm) : Full Thickness Without Exposed Full Thickness Without Exposed N/A Classification: Support Structures Support Structures Medium Medium N/A Exudate Amount: Serosanguineous Serosanguineous N/A Exudate Type: red, brown red, brown N/A Exudate Color: Distinct, outline attached Distinct, outline attached N/A Wound Margin: Large (67-100%) Medium (34-66%) N/A Granulation Amount: Red Red, Friable N/A Granulation Quality: Small (1-33%) Medium (34-66%) N/A Necrotic Amount: Fat Layer (Subcutaneous Tissue): Yes Fat Layer (Subcutaneous Tissue): Yes N/A Exposed Structures: Fascia: No Fascia: No Tendon: No Tendon: No Muscle: No Muscle: No Joint: No Joint: No Bone: No Bone: No Small (1-33%) Small (1-33%) N/A Epithelialization: Callus: Yes No Abnormalities Noted N/A Periwound Skin Texture: No Abnormalities Noted No Abnormalities Noted N/A Periwound Skin Moisture: No Abnormalities Noted No Abnormalities Noted N/A Periwound Skin Color: No Abnormality No Abnormality N/A Temperature: [name] [name], [name] [name] ([name] [name] Page 4 of 7 [name] Notes Electronic Signature(s) Signed: 04/28/2022 10:08:01 AM By: Fredirick Maudlin MD FACS Entered By: Fredirick Maudlin on  [name] 10:08:01 -------------------------------------------------------------------------------- Multi-Disciplinary Care Plan Details Patient Name: Date of Service: [name] [name]. 04/28/2022 9:00 [place] Record Number: [id number] Patient Account Number: [id number] Date of Birth/Sex: Treating RN: [birth year][name] (52 y.o. [name] [name] Primary Care Marland Reine: [name] [name] Other Clinician: Referring Akili Corsetti: Treating Francesco Provencal/Extender: [name] [name] in [name]: 0 Multidisciplinary Care Plan reviewed with physician Active Inactive Wound/Skin Impairment Nursing Diagnoses: Impaired tissue integrity Knowledge deficit related to ulceration/compromised skin integrity Goals: Patient/caregiver will verbalize understanding of skin care regimen Date Initiated: 04/28/2022 [place] Resolution Date: 05/26/2022 Goal Status: Active Ulcer/skin breakdown will have a volume reduction of 30% by week 4 Date Initiated: 04/28/2022 [place]  Resolution Date: 05/26/2022 Goal Status: Active Interventions: Assess patient/caregiver ability to obtain necessary supplies Assess patient/caregiver ability to perform ulcer/skin care regimen upon admission and as needed Assess ulceration(s) every visit Provide education on ulcer and skin care [name] Activities: Skin care regimen initiated : 04/28/2022 Topical wound management initiated : 04/28/2022 Notes: Electronic Signature(s) Signed: 04/28/2022 5:30:43 PM By: Baruch Gouty RN, BSN Entered By: Baruch [name] on [name] 10:05:55 -------------------------------------------------------------------------------- Pain Assessment Details Patient Name: Date of Service: [name] Judeen Hammans, [place] YNE [name]. 04/28/2022 9:00 [place] Record Number: [id number] Patient Account Number: [id number] Date of Birth/Sex: Treating RN: April [birth year], [name] (52 y.o. [name] [name] Primary Care Chatara Lucente: [name] [name] Other Clinician: Referring Chandel Zaun: Treating  Keandria Berrocal/Extender: [name] [name] in [name]: 0 Active Problems Location of Pain Severity and Description of Pain Patient Has Paino No Site Locations Rate the pain. [name] [name], [name] [name] ([id number] Page 5 of 7 Rate the pain. Current Pain Level: 0 Pain Management and Medication Current Pain Management: Electronic Signature(s) Signed: 04/28/2022 5:30:43 PM By: Baruch Gouty RN, BSN Entered By: Baruch [name] on [name] 09:53:31 -------------------------------------------------------------------------------- Patient/Caregiver Education Details Patient Name: Date of Service: [name] Judeen [place], [place] 2/26/2024andnbsp9:00 [place] Record Number: [id number] Patient Account Number: [id number] Date of Birth/Gender: Treating RN: [name] (52 y.o. [name] [name] Primary Care Physician: [name] [name] Other Clinician: Referring Physician: Treating Physician/Extender: [name] [name] in [name]: 0 Education Assessment Education Provided To: Patient Education Topics Provided Wound/Skin Impairment: Handouts: Caring for Your Ulcer, Smoking and Wound Healing Methods: Explain/Verbal, Printed Responses: Reinforcements needed, State content correctly Electronic Signature(s) Signed: 04/28/2022 5:30:43 PM By: Baruch Gouty RN, BSN Entered By: Baruch [name] on [name] 10:06:33 -------------------------------------------------------------------------------- Wound Assessment Details Patient Name: Date of Service: [name] [name]. 04/28/2022 9:00 A M Medical Record Number: [id number] Patient Account Number: [id number] Date of Birth/Sex: Treating RN: [birth year][name] (52 y.o. 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 Wound Status: Open Wounding Event: Trauma Comorbid History: Anemia, Osteomyelitis, Paraplegia Date Acquired: 11/15/2021 Weeks Of [name]: 0 [name] [name], [name] [name] ([id number] Page 6 of 7 Clustered Wound: No Photos Wound Measurements Length: (cm) 1 Width: (cm) 1.6 Depth: (cm) 1.1 Area: (cm) 1.257 Volume: (cm) 1.382 % Reduction in Area: % Reduction in Volume: Epithelialization: Small (1-33%) Tunneling: No Undermining: No Wound Description Classification: Full Thickness Without Exposed Support Structures Wound Margin: Distinct, outline attached Exudate Amount: Medium Exudate Type: Serosanguineous Exudate Color: red, brown Foul Odor After Cleansing: No Slough/Fibrino Yes Wound Bed Granulation Amount: Large (67-100%) Exposed Structure Granulation Quality: Red Fascia Exposed: No Necrotic Amount: Small (1-33%) Fat Layer (Subcutaneous Tissue) Exposed: Yes Necrotic Quality: Adherent Slough Tendon Exposed: No Muscle Exposed: No Joint Exposed: No Bone Exposed: No Periwound Skin Texture Texture Color No Abnormalities Noted: No No Abnormalities Noted: Yes Callus: Yes Temperature / Pain Temperature: No Abnormality Moisture No Abnormalities Noted: Yes Electronic Signature(s) Signed: 04/28/2022 5:30:43 PM By: Baruch Gouty RN, BSN Entered By: Baruch [name] on [name] 09:51:43 -------------------------------------------------------------------------------- Wound Assessment Details Patient Name: Date of Service: [name] [name]. 04/28/2022 9:00 A M Medical Record Number: [id number] Patient Account Number: [id number] Date of Birth/Sex: Treating RN: [name] (52 y.o. [name] [name] Primary Care Nasir Bright: [name] [name] Other Clinician: Referring Jelesa Mangini: Treating Deangelo Berns/Extender: [name] [name] in [name]: 0 Wound  Status Wound Number: 6 Primary Etiology: Neuropathic  Ulcer-Non Diabetic Wound Location: Right, Lateral Foot Wound Status: Open Wounding Event: Trauma Comorbid History: Anemia, Osteomyelitis, Paraplegia Date Acquired: 11/15/2021 Weeks Of [name]: 0 Clustered Wound: No Photos [name] [name], [name] [name] ([id number] Page 7 of 7 Wound Measurements Length: (cm) 1.7 Width: (cm) 1.3 Depth: (cm) 0.2 Area: (cm) 1.736 Volume: (cm) 0.347 % Reduction in Area: % Reduction in Volume: Epithelialization: Small (1-33%) Tunneling: No Undermining: No Wound Description Classification: Full Thickness Without Exposed Support Structures Wound Margin: Distinct, outline attached Exudate Amount: Medium Exudate Type: Serosanguineous Exudate Color: red, brown Foul Odor After Cleansing: No Slough/Fibrino Yes Wound Bed Granulation Amount: Medium (34-66%) Exposed Structure Granulation Quality: Red, Friable Fascia Exposed: No Necrotic Amount: Medium (34-66%) Fat Layer (Subcutaneous Tissue) Exposed: Yes Necrotic Quality: Adherent Slough Tendon Exposed: No Muscle Exposed: No Joint Exposed: No Bone Exposed: No Periwound Skin Texture Texture Color No Abnormalities Noted: Yes No Abnormalities Noted: Yes Moisture Temperature / Pain No Abnormalities Noted: Yes Temperature: No Abnormality Electronic Signature(s) Signed: 04/28/2022 5:30:43 PM By: Baruch Gouty RN, BSN Entered By: Baruch [name] on [name] 09:53:14 -------------------------------------------------------------------------------- Vitals Details Patient Name: Date of Service: [name] [name]. 04/28/2022 9:00 A M Medical Record Number: [id number] Patient Account Number: [id number] Date of Birth/Sex: Treating RN: Jan [birth year], [name] (52 y.o. [name] [name] Primary Care Gorman Safi: [name] [name] Other Clinician: Referring Ariely Riddell: Treating April Colter/Extender: [name] [name] in [name]: 0 Vital Signs Time Taken: 09:28 Temperature (F):  98 Height (in): 78 Pulse (bpm): 97 Source: Stated Respiratory Rate (breaths/min): 18 Weight (lbs): 336 Blood Pressure (mmHg): 120/78 Source: Stated Reference Range: 80 - 120 mg / dl Body Mass Index (BMI): 38.8 Electronic Signature(s) Signed: 04/28/2022 5:30:43 PM By: Baruch Gouty RN, BSN Entered By: Baruch [name] on [name] 09:29:00

## 2022-05-06 ENCOUNTER — Encounter (HOSPITAL_BASED_OUTPATIENT_CLINIC_OR_DEPARTMENT_OTHER): Payer: 59 | Attending: General Surgery | Admitting: General Surgery

## 2022-05-06 DIAGNOSIS — Z6838 Body mass index (BMI) 38.0-38.9, adult: Secondary | ICD-10-CM | POA: Diagnosis not present

## 2022-05-06 DIAGNOSIS — L97412 Non-pressure chronic ulcer of right heel and midfoot with fat layer exposed: Secondary | ICD-10-CM | POA: Insufficient documentation

## 2022-05-06 DIAGNOSIS — M86171 Other acute osteomyelitis, right ankle and foot: Secondary | ICD-10-CM | POA: Diagnosis not present

## 2022-05-06 DIAGNOSIS — L97512 Non-pressure chronic ulcer of other part of 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: Secondary | ICD-10-CM | POA: Insufficient documentation

## 2022-05-06 DIAGNOSIS — G8220 Paraplegia, unspecified: Secondary | ICD-10-CM | POA: Insufficient documentation
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
## 2022-05-08 NOTE — Progress Notes (Signed)
IZZIAH, CAPPER ([id number]) [phone].pdf Page 1 of 9 Visit Report for 05/06/2022 Arrival Information Details Patient Name: Date of Service: [name], [place] [name]. 05/06/2022 10:30 A M Medical Record Number: [id number] Patient Account Number: [id number] Date of Birth/Sex: Treating RN: Jun 11, 1970 (52 y.o. M) Primary Care Deloris Moger: Arthur Holms Other Clinician: Referring Karlen Barbar: Treating Audine Mangione/Extender: Hollice Espy in Treatment: 1 Visit Information History Since Last Visit All ordered tests and consults were completed: No Patient Arrived: Gilford Rile Added or deleted any medications: No Arrival Time: 10:18 Any new allergies or adverse reactions: No Accompanied By: self Had a fall or experienced change in No Transfer Assistance: None activities of daily living that may affect Patient Identification Verified: Yes risk of falls: Secondary Verification Process Completed: Yes Signs or symptoms of abuse/neglect since last visito No Patient Has Alerts: Yes Hospitalized since last visit: No Patient Alerts: R ABI= 1.16, [name] ABI= 1.09 Implantable device outside of the clinic excluding No cellular tissue based products placed in the center since last visit: Has Dressing in Place as Prescribed: Yes Pain Present Now: No Electronic Signature(s) Signed: 05/06/2022 5:11:49 PM By: Baruch Gouty RN, BSN Entered By: Baruch Gouty on 05/06/2022 10:53:52 -------------------------------------------------------------------------------- Encounter Discharge Information Details Patient Name: Date of Service: [name], [name] [name]. 05/06/2022 10:30 A M Medical Record Number: [id number] Patient Account Number: [id number] Date of Birth/Sex: Treating RN: 09-Dec-1970 (52 y.o. Ernestene Mention Primary Care Margueritte Guthridge: Arthur Holms Other Clinician: Referring Leiyah Maultsby: Treating Adriana Quinby/Extender: Hollice Espy in Treatment: 1 Encounter Discharge Information  Items Post Procedure Vitals Discharge Condition: Stable Temperature (F): 97.8 Ambulatory Status: Wheelchair Pulse (bpm): 79 Discharge Destination: Home Respiratory Rate (breaths/min): 18 Transportation: Private Auto Blood Pressure (mmHg): 120/80 Accompanied By: self Schedule Follow-up Appointment: Yes Clinical Summary of Care: Patient Declined Electronic Signature(s) Signed: 05/06/2022 5:11:49 PM By: Baruch Gouty RN, BSN Entered By: Baruch Gouty on 05/06/2022 11:27:27 -------------------------------------------------------------------------------- Lower Extremity Assessment Details Patient Name: Date of Service: [name], [name] [name]. 05/06/2022 10:30 A M Medical Record Number: [id number] Patient Account Number: [id number] Date of Birth/Sex: Treating RN: 10-20-1970 (52 y.o. M) Primary Care Hodan Wurtz: Arthur Holms Other Clinician: Referring Cheney Gosch: Treating Joncarlos Atkison/Extender: Hollice Espy in Treatment: 1 Edema Assessment D[Left: Weston Anna [id number] Patrice [id number].pdf Page 2 of 9] Assessed: [Left: No] [Right: No] Edema: [Left: Ye] [Right: s] Calf Left: Right: Point of Measurement: From Medial Instep 40 cm Ankle Left: Right: Point of Measurement: From Medial Instep 32 cm Vascular Assessment Pulses: Dorsalis Pedis Palpable: [Right:Yes] Electronic Signature(s) Signed: 05/06/2022 5:11:49 PM By: Baruch Gouty RN, BSN Entered By: Baruch Gouty on 05/06/2022 10:54:26 -------------------------------------------------------------------------------- Multi Wound Chart Details Patient Name: Date of Service: [name], [name] [name]. 05/06/2022 10:30 A M Medical Record Number: [id number] Patient Account Number: [id number] Date of Birth/Sex: Treating RN: 1970-12-16 (52 y.o. 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 Primary Etiology: Anemia, Osteomyelitis, Paraplegia Anemia, Osteomyelitis, Paraplegia N/A Comorbid History: 11/15/2021 11/15/2021 N/A Date Acquired: 1 1 N/A Weeks of Treatment: Open Open N/A Wound Status: No No N/A Wound Recurrence: 0.2x0.2x0.5 1.5x1.2x0.1 N/A Measurements [name] x W x D (cm) 0.031 1.414 N/A  A (cm) : rea 0.016 0.141 N/A Volume (cm) : 97.50% 18.50% N/A % Reduction in A rea: 98.80% 59.40% N/A % Reduction in Volume: 12 Starting Position 1 (o'clock): 12 Ending Position 1 (o'clock): 0.3 Maximum Distance 1 (cm): Yes No N/A Undermining: Full Thickness Without Exposed Full Thickness Without Exposed N/A Classification: Support Structures Support Structures Medium Medium N/A Exudate Amount: Serosanguineous Serosanguineous N/A Exudate Type: red, brown red, brown N/A Exudate Color: Distinct, outline attached Distinct, outline attached N/A Wound Margin: Large (67-100%) Large (67-100%) N/A Granulation Amount: INA, STRAUSS [name] ([id number].pdf Page 3 of 9 Red Red N/A Granulation Quality: Small (1-33%) Small (1-33%) N/A Necrotic Amount: Fat Layer (Subcutaneous Tissue): Yes Fat Layer (Subcutaneous Tissue): Yes N/A Exposed Structures: Fascia: No Fascia: No Tendon: No Tendon: No Muscle: No Muscle: No Joint: No Joint: No Bone: No Bone: No Small (1-33%) None N/A Epithelialization: Debridement - Selective/Open Wound Debridement - Selective/Open Wound N/A Debridement: Pre-procedure Verification/Time Out 11:05 11:05 N/A Taken: Callus Callus, Slough N/A Tissue  Debrided: Skin/Epidermis Skin/Epidermis N/A Level: 4 1.8 N/A Debridement A (sq cm): rea Curette Curette N/A Instrument: Minimum Minimum N/A Bleeding: Silver Nitrate Silver Nitrate N/A Hemostasis A chieved: Insensate Insensate N/A Procedural Pain: Insensate Insensate N/A Post Procedural Pain: Procedure was tolerated well Procedure was tolerated well N/A Debridement Treatment Response: 0.4x0.4x0.3 1.5x1.2x0.1 N/A Post Debridement Measurements [name] x W x D (cm) 0.038 0.141 N/A Post Debridement Volume: (cm) Callus: Yes No Abnormalities Noted N/A Periwound Skin Texture: No Abnormalities Noted No Abnormalities Noted N/A Periwound Skin Moisture: No Abnormalities Noted No Abnormalities Noted N/A Periwound Skin Color: No Abnormality No Abnormality N/A Temperature: Debridement Debridement N/A Procedures Performed: Treatment Notes Wound #5 (Calcaneus) Wound Laterality: Right Cleanser Normal Saline Discharge Instruction: Cleanse the wound with Normal Saline prior to applying a clean dressing using gauze sponges, not tissue or cotton balls. Soap and Water Discharge Instruction: May shower and wash wound with dial antibacterial soap and water prior to dressing change. Byram Ancillary Kit - 15 Day Supply Discharge Instruction: Use supplies as instructed; Kit contains: (15) Saline Bullets; (15) 3x3 Gauze; 15 pr Gloves Peri-Wound Care Topical Primary Dressing Aquacel Ag Dressing, 2x2 (in/in) Secondary Dressing Bordered Gauze, 2x3.75 in Discharge Instruction: Apply over primary dressing as directed. Secured With Compression Wrap Compression Stockings Add-Ons Wound #6 (Foot) Wound Laterality: Right, Lateral Cleanser Normal Saline Discharge Instruction: Cleanse the wound with Normal Saline prior to applying a clean dressing using gauze sponges, not tissue or cotton balls. Soap and Water Discharge Instruction: May shower and wash wound with dial antibacterial soap and water prior  to dressing change. Byram Ancillary Kit - 15 Day Supply Discharge Instruction: Use supplies as instructed; Kit contains: (15) Saline Bullets; (15) 3x3 Gauze; 15 pr Gloves Peri-Wound Care Topical Primary Dressing Aquacel Ag Dressing, 2x2 (in/in) Secondary Dressing Bordered Gauze, 2x3.75 in [name], [name] [name] ([id number]) [phone].pdf Page 4 of 9 Discharge Instruction: Apply over primary dressing as directed. Secured With Compression Wrap Compression Stockings Environmental education officer) Signed: 05/06/2022 11:31:18 AM By: Fredirick Maudlin MD FACS Entered By: Fredirick Maudlin on 05/06/2022 11:31:18 -------------------------------------------------------------------------------- Multi-Disciplinary Care Plan Details Patient Name: Date of Service: [name], [name] [name]. 05/06/2022 10:30 A M Medical Record Number: [id number] Patient Account Number: [id number] Date of Birth/Sex: Treating RN: Sep 13, 1970 (52 y.o. Ernestene Mention Primary Care Adie Vilar: Arthur Holms Other Clinician: Referring Collyn Ribas: Treating Carena Stream/Extender: Hollice Espy in Treatment: 1 Multidisciplinary Care Plan reviewed with physician Active Inactive Pressure Nursing Diagnoses: Knowledge deficit related to  management of pressures ulcers Potential for impaired tissue integrity related to pressure, friction, moisture, and shear Goals: Patient/caregiver will verbalize understanding of pressure ulcer management Date Initiated: 05/06/2022 Target Resolution Date: 05/27/2022 Goal Status: Active Interventions: Assess: immobility, friction, shearing, incontinence upon admission and as needed Notes: Wound/Skin Impairment Nursing Diagnoses: Impaired tissue integrity Knowledge deficit related to ulceration/compromised skin integrity Goals: Patient/caregiver will verbalize understanding of skin care regimen Date Initiated: 04/28/2022 Target Resolution Date: 05/26/2022 Goal Status:  Active Ulcer/skin breakdown will have a volume reduction of 30% by week 4 Date Initiated: 04/28/2022 Target Resolution Date: 05/26/2022 Goal Status: Active Interventions: Assess patient/caregiver ability to obtain necessary supplies Assess patient/caregiver ability to perform ulcer/skin care regimen upon admission and as needed Assess ulceration(s) every visit Provide education on ulcer and skin care Treatment Activities: Skin care regimen initiated : 04/28/2022 Topical wound management initiated : 04/28/2022 Notes: Electronic Signature(s) [name], [name] ([id number]) [phone].pdf Page 5 of 9 Signed: 05/06/2022 5:11:49 PM By: Baruch Gouty RN, BSN Entered By: Baruch Gouty on 05/06/2022 10:58:33 -------------------------------------------------------------------------------- Pain Assessment Details Patient Name: Date of Service: [name], [name] [name]. 05/06/2022 10:30 A M Medical Record Number: [id number] Patient Account Number: [id number] Date of Birth/Sex: Treating RN: March 11, 1970 (52 y.o. M) Primary Care Malik Paar: Arthur Holms Other Clinician: Referring Kaelynne Christley: Treating Yahira Timberman/Extender: Hollice Espy in Treatment: 1 Active Problems Location of Pain Severity and Description of Pain Patient Has Paino No Site Locations Rate the pain. Current Pain Level: 0 Pain Management and Medication Current Pain Management: Electronic Signature(s) Signed: 05/06/2022 5:11:49 PM By: Baruch Gouty RN, BSN Entered By: Baruch Gouty on 05/06/2022 10:54:14 -------------------------------------------------------------------------------- Patient/Caregiver Education Details Patient Name: Date of Service: [name] Judeen Hammans, [name] Verne Grain 3/5/2024andnbsp10:30 A M Medical Record Number: [id number] Patient Account Number: [id number] Date of Birth/Gender: Treating RN: 06-01-1970 (52 y.o. Ernestene Mention Primary Care Physician: Arthur Holms Other Clinician: Referring  Physician: Treating Physician/Extender: Hollice Espy in Treatment: 1 Education Assessment Education Provided To: Patient Education Topics Provided Wound/Skin Impairment: Methods: Explain/Verbal Responses: Reinforcements needed, State content correctly Electronic Signature(s) Signed: 05/06/2022 5:11:49 PM By: Baruch Gouty RN, BSN [name], [name] ([id number]) [id number] Page 6 of 9 Entered By: Baruch Gouty on 05/06/2022 10:59:24 -------------------------------------------------------------------------------- Wound Assessment Details Patient Name: Date of Service: [name], [name] [name]. 05/06/2022 10:30 A M Medical Record Number: [id number] Patient Account Number: [id number] Date of Birth/Sex: Treating RN: [birth year] (52 y.o. 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 Wound Status: Open Wounding Event: Trauma Comorbid History: Anemia, Osteomyelitis, Paraplegia Date Acquired: 11/15/2021 Weeks Of Treatment: 1 Clustered Wound: No Photos Wound Measurements Length: (cm) 0.2 Width: (cm) 0.2 Depth: (cm) 0.5 Area: (cm) 0.031 Volume: (cm) 0.016 % Reduction in Area: 97.5% % Reduction in Volume: 98.8% Epithelialization: Small (1-33%) Tunneling: No Undermining: Yes Starting Position (o'clock): 12 Ending Position (o'clock): 12 Maximum Distance: (cm) 0.3 Wound Description Classification: Full Thickness Without Exposed Support Structures Wound Margin: Distinct, outline attached Exudate Amount: Medium Exudate Type: Serosanguineous Exudate Color: red, brown Foul Odor After Cleansing: No Slough/Fibrino Yes Wound Bed Granulation Amount: Large (67-100%) Exposed Structure Granulation Quality: Red Fascia  Exposed: No Necrotic Amount: Small (1-33%) Fat Layer (Subcutaneous Tissue) Exposed: Yes Necrotic Quality: Adherent Slough Tendon Exposed: No Muscle Exposed: No Joint Exposed: No Bone Exposed: No Periwound Skin Texture Texture Color No  Abnormalities Noted: No No Abnormalities Noted: Yes Callus: Yes Temperature / Pain Temperature: No Abnormality Moisture No Abnormalities Noted: Yes Treatment Notes Wound #5 (Calcaneus) Wound Laterality: Right [name], [name] ([id number].pdf Page 7 of 9 Cleanser Normal Saline Discharge Instruction: Cleanse the wound with Normal Saline prior to applying a clean dressing using gauze sponges, not tissue or cotton balls. Soap and Water Discharge Instruction: May shower and wash wound with dial antibacterial soap and water prior to dressing change. Byram Ancillary Kit - 15 Day Supply Discharge Instruction: Use supplies as instructed; Kit contains: (15) Saline Bullets; (15) 3x3 Gauze; 15 pr Gloves Peri-Wound Care Topical Primary Dressing Aquacel Ag Dressing, 2x2 (in/in) Secondary Dressing Bordered Gauze, 2x3.75 in Discharge Instruction: Apply over primary dressing as directed. Secured With Compression Wrap Compression Stockings Environmental education officer) Signed: 05/06/2022 5:11:49 PM By: Baruch Gouty RN, BSN Entered By: Baruch Gouty on 05/06/2022 10:56:18 -------------------------------------------------------------------------------- Wound Assessment Details Patient Name: Date of Service: [name], [name] [name]. 05/06/2022 10:30 A M Medical Record Number: [id number] Patient Account Number: [id number] Date of Birth/Sex: Treating RN: 07-13-1970 (52 y.o. 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,  Lateral Foot Wound Status: Open Wounding Event: Trauma Comorbid History: Anemia, Osteomyelitis, Paraplegia Date Acquired: 11/15/2021 Weeks Of Treatment: 1 Clustered Wound: No Photos Wound Measurements Length: (cm) 1.5 Width: (cm) 1.2 Depth: (cm) 0.1 Area: (cm) 1.414 Volume: (cm) 0.141 % Reduction in Area: 18.5% % Reduction in Volume: 59.4% Epithelialization: None Tunneling: No Undermining: No Wound Description Classification: Full Thickness Without Exposed Support Structures Wound Margin: Distinct, outline attached [name], [name] ([id number]) Exudate Amount: Medium Exudate Type: Serosanguineous Exudate Color: red, brown Foul Odor After Cleansing: No Slough/Fibrino Yes [phone].pdf Page 8 of 9 Wound Bed Granulation Amount: Large (67-100%) Exposed Structure Granulation Quality: Red Fascia Exposed: No Necrotic Amount: Small (1-33%) Fat Layer (Subcutaneous Tissue) Exposed: Yes Necrotic Quality: Adherent Slough Tendon Exposed: No Muscle Exposed: No Joint Exposed: No Bone Exposed: No Periwound Skin Texture Texture Color No Abnormalities Noted: Yes No Abnormalities Noted: Yes Moisture Temperature / Pain No Abnormalities Noted: Yes Temperature: No Abnormality Treatment Notes Wound #6 (Foot) Wound Laterality: Right, Lateral Cleanser Normal Saline Discharge Instruction: Cleanse the wound with Normal Saline prior to applying a clean dressing using gauze sponges, not tissue or cotton balls. Soap and Water Discharge Instruction: May shower and wash wound with dial antibacterial soap and water prior to dressing change. Byram Ancillary Kit - 15 Day Supply Discharge Instruction: Use supplies as instructed; Kit contains: (15) Saline Bullets; (15) 3x3 Gauze; 15 pr Gloves Peri-Wound Care Topical Primary Dressing Aquacel Ag Dressing, 2x2 (in/in) Secondary Dressing Bordered Gauze, 2x3.75 in Discharge Instruction: Apply over primary dressing as  directed. Secured With Compression Wrap Compression Stockings Environmental education officer) Signed: 05/06/2022 5:11:49 PM By: Baruch Gouty RN, BSN Entered By: Baruch Gouty on 05/06/2022 10:56:52 -------------------------------------------------------------------------------- Vitals Details Patient Name: Date of Service: [name], [name] [name]. 05/06/2022 10:30 A M Medical Record Number: [id number] Patient Account Number: [id number] Date of Birth/Sex: Treating RN: [birth year] (52 y.o. M) Primary Care Damoni Erker: Arthur Holms Other Clinician: Referring Aidden Markovic: Treating Anacristina Steffek/Extender: Hollice Espy in Treatment: 1 Vital Signs Time Taken: 10:19 Temperature (F): 97.8 Height (in): 78 Pulse (bpm): 79 Weight (lbs): 336 Respiratory Rate (breaths/min): 20 Body Mass Index (BMI): 38.8 Blood Pressure (mmHg): 120/80 Reference Range: 80 -  120 mg / dl [name], [name] ([id number]) [phone].pdf Page 9 of 9 Electronic Signature(s) Signed: 05/06/2022 5:11:49 PM By: Baruch Gouty RN, BSN Entered By: Baruch Gouty on 05/06/2022 10:54:07

## 2022-05-12 ENCOUNTER — Encounter (HOSPITAL_BASED_OUTPATIENT_CLINIC_OR_DEPARTMENT_OTHER): Payer: 59 | Admitting: General Surgery

## 2022-05-12 DIAGNOSIS — L97412 Non-pressure chronic ulcer of right heel and midfoot with fat layer exposed: Secondary | ICD-10-CM | POA: Diagnosis not present
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
## 2022-05-13 NOTE — Progress Notes (Signed)
TEANCUM, BALDEZ ([id number].pdf Page 1 of 9 Visit Report for 05/12/2022 Arrival Information Details Patient Name: Date of Service: [name], [place] [name] 05/12/2022 7:30 A M Medical Record Number: [id number] Patient Account Number: [id number] Date of Birth/Sex: Treating RN: 1970-09-17 (52 y.o. Ernestene Mention Primary Care Xareni Kelch: Arthur Holms Other Clinician: Referring Mignonne Afonso: Treating Ryker Pherigo/Extender: Hollice Espy in Treatment: 2 Visit Information History Since Last Visit Added or deleted any medications: No Patient Arrived: Wheel Chair Any new allergies or adverse reactions: No Arrival Time: 07:39 Had a fall or experienced change in No Accompanied By: self activities of daily living that may affect Transfer Assistance: None risk of falls: Patient Identification Verified: Yes Signs or symptoms of abuse/neglect since last visito No Secondary Verification Process Completed: Yes Hospitalized since last visit: No Patient Has Alerts: Yes Implantable device outside of the clinic excluding No Patient Alerts: R ABI= 1.16, L ABI= 1.09 cellular tissue based products placed in the center since last visit: Has Dressing in Place as Prescribed: Yes Pain Present Now: No Electronic Signature(s) Signed: 05/12/2022 5:44:44 PM By: Baruch Gouty RN, BSN Entered By: Baruch Gouty on 05/12/2022 07:40:06 -------------------------------------------------------------------------------- Encounter Discharge Information Details Patient Name: Date of Service: [name] 05/12/2022 7:30 A M Medical Record Number: [id number] Patient Account Number: [id number] Date of Birth/Sex: Treating RN: May 08, 1970 (52 y.o. Ernestene Mention Primary Care Koby Pickup: Arthur Holms Other Clinician: Referring Daneesha Quinteros: Treating Maraya Gwilliam/Extender: Hollice Espy in Treatment: 2 Encounter Discharge Information Items Post Procedure  Vitals Discharge Condition: Stable Temperature (F): 98 Ambulatory Status: Wheelchair Pulse (bpm): 75 Discharge Destination: Home Respiratory Rate (breaths/min): 18 Transportation: Private Auto Blood Pressure (mmHg): 129/80 Accompanied By: self Schedule Follow-up Appointment: Yes Clinical Summary of Care: Patient Declined Electronic Signature(s) Signed: 05/12/2022 5:44:44 PM By: Baruch Gouty RN, BSN Entered By: Baruch Gouty on 05/12/2022 08:23:58 -------------------------------------------------------------------------------- Lower Extremity Assessment Details Patient Name: Date of Service: [name] 05/12/2022 7:30 A M Medical Record Number: [id number] Patient Account Number: [id number] Date of Birth/Sex: Treating RN: 1970-03-29 (52 y.o. Ernestene Mention Primary Care Kiaan Overholser: Arthur Holms Other Clinician: Referring Doni Widmer: Treating Gaspard Isbell/Extender: Hollice Espy in Treatment: 2 Edema Assessment Assessed: [Left: No] [Right: No] D[LeftLAVERE, ECHARD [id number] [[id number] Page 2 of 9] Edema: [Left: Ye] [Right: s] Calf Left: Right: Point of Measurement: From Medial Instep 40 cm Ankle Left: Right: Point of Measurement: From Medial Instep 32 cm Vascular Assessment Pulses: Dorsalis Pedis Palpable: [Right:Yes] Electronic Signature(s) Signed: 05/12/2022 5:44:44 PM By: Baruch Gouty RN, BSN Entered By: Baruch Gouty on 05/12/2022 07:46:16 -------------------------------------------------------------------------------- Multi Wound Chart Details Patient Name: Date of Service: [name] 05/12/2022 7:30 A M Medical Record Number: [id number] Patient Account Number: [id number] Date of Birth/Sex: Treating RN: Aug 20, 1970 (52 y.o. 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 Primary Etiology: Anemia, Osteomyelitis, Paraplegia Anemia, Osteomyelitis, Paraplegia N/A Comorbid History: 11/15/2021 11/15/2021 N/A Date Acquired: 2 2 N/A Weeks of Treatment: Open Open N/A Wound Status: No No N/A Wound Recurrence: 0.3x0.3x0.4 1.4x1x0.3 N/A Measurements L x W x D (cm) 0.071 1.1 N/A A (cm) :  rea 0.028 0.33 N/A Volume (cm) : 94.40% 36.60% N/A % Reduction in A rea: 98.00% 4.90% N/A % Reduction in Volume: Full Thickness Without Exposed Full Thickness Without Exposed N/A Classification: Support Structures Support Structures Small Medium N/A Exudate Amount: Serosanguineous Serosanguineous N/A Exudate Type: red, brown red, brown N/A Exudate Color: Distinct, outline attached Distinct, outline attached N/A Wound Margin: Large (67-100%) Large (67-100%) N/A Granulation Amount: Red Red, Hyper-granulation N/A Granulation Quality: Small (1-33%) Small (1-33%) N/A Necrotic Amount: Fat Layer (Subcutaneous Tissue): Yes Fat Layer (Subcutaneous Tissue): Yes N/A Exposed Structures: Fascia: No Fascia: No Tendon: No Tendon: No Randon, Theran L ([id number] Page 3 of 9 Muscle: No Muscle: No Joint: No Joint: No Bone: No Bone: No Small (1-33%) Small (1-33%) N/A Epithelialization: Debridement - Selective/Open Wound N/A N/A Debridement: Pre-procedure Verification/Time Out 08:05 N/A N/A Taken: Callus N/A N/A Tissue Debrided: Skin/Epidermis N/A N/A Level: 0.48 N/A N/A Debridement A (sq cm): rea Curette N/A N/A Instrument: Minimum N/A N/A Bleeding: Pressure N/A N/A Hemostasis  A chieved: Insensate N/A N/A Procedural Pain: Insensate N/A N/A Post Procedural Pain: Procedure was tolerated well N/A N/A Debridement Treatment Response: 0.5x0.8x0.3 N/A N/A Post Debridement Measurements L x W x D (cm) 0.094 N/A N/A Post Debridement Volume: (cm) Callus: Yes No Abnormalities Noted N/A Periwound Skin Texture: No Abnormalities Noted No Abnormalities Noted N/A Periwound Skin Moisture: No Abnormalities Noted No Abnormalities Noted N/A Periwound Skin Color: No Abnormality No Abnormality N/A Temperature: Debridement Chemical Cauterization N/A Procedures Performed: Treatment Notes Wound #5 (Calcaneus) Wound Laterality: Right Cleanser Normal Saline Discharge Instruction: Cleanse the wound with Normal Saline prior to applying a clean dressing using gauze sponges, not tissue or cotton balls. Soap and Water Discharge Instruction: May shower and wash wound with dial antibacterial soap and water prior to dressing change. Byram Ancillary Kit - 15 Day Supply Discharge Instruction: Use supplies as instructed; Kit contains: (15) Saline Bullets; (15) 3x3 Gauze; 15 pr Gloves Peri-Wound Care Topical Primary Dressing Aquacel Ag Dressing, 2x2 (in/in) Secondary Dressing Bordered Gauze, 2x3.75 in Discharge Instruction: Apply over primary dressing as directed. Secured With Compression Wrap Compression Stockings Add-Ons Wound #6 (Foot) Wound Laterality: Right, Lateral Cleanser Normal Saline Discharge Instruction: Cleanse the wound with Normal Saline prior to applying a clean dressing using gauze sponges, not tissue or cotton balls. Soap and Water Discharge Instruction: May shower and wash wound with dial antibacterial soap and water prior to dressing change. Byram Ancillary Kit - 15 Day Supply Discharge Instruction: Use supplies as instructed; Kit contains: (15) Saline Bullets; (15) 3x3 Gauze; 15 pr Gloves Peri-Wound Care Topical Primary Dressing Aquacel Ag Dressing, 2x2  (in/in) Secondary Dressing Bordered Gauze, 2x3.75 in Discharge Instruction: Apply over primary dressing as directed. Secured With MOON, REICHERT ([id number]) [id number] Page 4 of 9 Compression Wrap Compression Stockings Add-Ons Electronic Signature(s) Signed: 05/12/2022 8:25:40 AM By: Fredirick Maudlin MD FACS Entered By: Fredirick Maudlin on 05/12/2022 08:25:40 -------------------------------------------------------------------------------- Multi-Disciplinary Care Plan Details Patient Name: Date of Service: [name] 05/12/2022 7:30 A M Medical Record Number: [id number] Patient Account Number: [id number] Date of Birth/Sex: Treating RN: 1970-03-16 (52 y.o. Ernestene Mention Primary Care Mairin Lindsley: Arthur Holms Other Clinician: Referring Jervon Ream: Treating Trypp Heckmann/Extender: Hollice Espy in Treatment: 2 Multidisciplinary Care Plan reviewed with physician Active Inactive Pressure Nursing Diagnoses: Knowledge deficit related to management of pressures ulcers Potential for impaired tissue integrity related to pressure, friction, moisture, and shear Goals: Patient/caregiver will verbalize understanding of pressure ulcer management Date Initiated: 05/06/2022  Target Resolution Date: 05/27/2022 Goal Status: Active Interventions: Assess: immobility, friction, shearing, incontinence upon admission and as needed Notes: Wound/Skin Impairment Nursing Diagnoses: Impaired tissue integrity Knowledge deficit related to ulceration/compromised skin integrity Goals: Patient/caregiver will verbalize understanding of skin care regimen Date Initiated: 04/28/2022 Target Resolution Date: 05/26/2022 Goal Status: Active Ulcer/skin breakdown will have a volume reduction of 30% by week 4 Date Initiated: 04/28/2022 Target Resolution Date: 05/26/2022 Goal Status: Active Interventions: Assess patient/caregiver ability to obtain necessary supplies Assess  patient/caregiver ability to perform ulcer/skin care regimen upon admission and as needed Assess ulceration(s) every visit Provide education on ulcer and skin care Treatment Activities: Skin care regimen initiated : 04/28/2022 Topical wound management initiated : 04/28/2022 Notes: Electronic Signature(s) Signed: 05/12/2022 5:44:44 PM By: Baruch Gouty RN, BSN Entered By: Baruch Gouty on 05/12/2022 07:54:14 Siegfried, Melvern Sample ([id number] Page 5 of 9 -------------------------------------------------------------------------------- Pain Assessment Details Patient Name: Date of Service: [name] 05/12/2022 7:30 A M Medical Record Number: [id number] Patient Account Number: [id number] Date of Birth/Sex: Treating RN: 1970/06/30 (52 y.o. Ernestene Mention Primary Care Jaylissa Felty: Arthur Holms Other Clinician: Referring Meleana Commerford: Treating Denaja Verhoeven/Extender: Hollice Espy in Treatment: 2 Active Problems Location of Pain Severity and Description of Pain Patient Has Paino No Site Locations Rate the pain. Current Pain Level: 0 Pain Management and Medication Current Pain Management: Electronic Signature(s) Signed: 05/12/2022 5:44:44 PM By: Baruch Gouty RN, BSN Entered By: Baruch Gouty on 05/12/2022 07:43:55 -------------------------------------------------------------------------------- Patient/Caregiver Education Details Patient Name: Date of Service: DA Judeen Hammans, [place]ne [place] 3/11/2024andnbsp7:30 [place] Record Number: [id number] Patient Account Number: [id number] Date of Birth/Gender: Treating RN: 07-09-1970 (52 y.o. Ernestene Mention Primary Care Physician: Arthur Holms Other Clinician: Referring Physician: Treating Physician/Extender: Hollice Espy in Treatment: 2 Education Assessment Education Provided To: Patient Education Topics Provided Pressure: Methods: Explain/Verbal Responses:  Reinforcements needed, State content correctly Wound/Skin Impairment: Methods: Explain/Verbal Responses: Reinforcements needed, State content correctly Electronic Signature(s) Signed: 05/12/2022 5:44:44 PM By: Baruch Gouty RN, BSN Dragovich, Cray L 05/12/2022 5:44:44 PM By: Baruch Gouty RN, BSN Signed: ([id number]) [id number] Page 6 of 9 Entered By: Baruch Gouty on 05/12/2022 07:54:33 -------------------------------------------------------------------------------- Wound Assessment Details Patient Name: Date of Service: [name] 05/12/2022 7:30 A M Medical Record Number: [id number] Patient Account Number: [id number] Date of Birth/Sex: Treating RN: 1970-12-08 (52 y.o. 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 Wound Status: Open Wounding Event: Trauma Comorbid History: Anemia, Osteomyelitis, Paraplegia Date Acquired: 11/15/2021 Weeks Of Treatment: 2 Clustered Wound: No Photos Wound Measurements Length: (cm) 0.3 Width: (cm) 0.3 Depth: (cm) 0.4 Area: (cm) 0.071 Volume: (cm) 0.028 % Reduction in Area: 94.4% % Reduction in Volume: 98% Epithelialization: Small (1-33%) Tunneling: No Undermining: No Wound Description Classification: Full Thickness Without Exposed Support Structures Wound Margin: Distinct, outline attached Exudate Amount: Small Exudate Type: Serosanguineous Exudate Color: red, brown Foul Odor After Cleansing: No Slough/Fibrino Yes Wound Bed Granulation Amount: Large (67-100%) Exposed Structure Granulation Quality: Red Fascia Exposed: No Necrotic Amount: Small (1-33%) Fat Layer (Subcutaneous Tissue) Exposed: Yes Necrotic Quality: Adherent Slough Tendon Exposed: No Muscle Exposed:  No Joint Exposed: No Bone Exposed: No Periwound Skin Texture Texture Color No Abnormalities Noted: No No Abnormalities Noted: Yes Callus: Yes Temperature / Pain Temperature: No Abnormality Moisture No Abnormalities Noted: Yes  Treatment Notes Wound #5 (Calcaneus) Wound Laterality: Right Cleanser Normal Saline Discharge Instruction: Cleanse the wound with Normal Saline prior to applying a clean dressing using gauze sponges, not tissue or cotton balls. AHMARI, HOADLEY ([id number] Page 7 of 9 Soap and Water Discharge Instruction: May shower and wash wound with dial antibacterial soap and water prior to dressing change. Byram Ancillary Kit - 15 Day Supply Discharge Instruction: Use supplies as instructed; Kit contains: (15) Saline Bullets; (15) 3x3 Gauze; 15 pr Gloves Peri-Wound Care Topical Primary Dressing Aquacel Ag Dressing, 2x2 (in/in) Secondary Dressing Bordered Gauze, 2x3.75 in Discharge Instruction: Apply over primary dressing as directed. Secured With Compression Wrap Compression Stockings Environmental education officer) Signed: 05/12/2022 5:44:44 PM By: Baruch Gouty RN, BSN Entered By: Baruch Gouty on 05/12/2022 07:53:24 -------------------------------------------------------------------------------- Wound Assessment Details Patient Name: Date of Service: [name], [name] 05/12/2022 7:30 A M Medical Record Number: [id number] Patient Account Number: [id number] Date of Birth/Sex: Treating RN: January 07, 1971 (52 y.o. 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, Lateral Foot Wound Status: Open Wounding Event: Trauma Comorbid History: Anemia, Osteomyelitis, Paraplegia Date Acquired: 11/15/2021 Weeks Of  Treatment: 2 Clustered Wound: No Photos Wound Measurements Length: (cm) 1.4 Width: (cm) 1 Depth: (cm) 0.3 Area: (cm) 1.1 Volume: (cm) 0.33 % Reduction in Area: 36.6% % Reduction in Volume: 4.9% Epithelialization: Small (1-33%) Tunneling: No Undermining: No Wound Description Classification: Full Thickness Without Exposed Support Structures Wound Margin: Distinct, outline attached Exudate Amount: Medium Exudate Type: Serosanguineous Exudate Color: red, brown Batta, Danyell L ([id number]) Foul Odor After Cleansing: No Slough/Fibrino Yes [id number] Page 8 of 9 Wound Bed Granulation Amount: Large (67-100%) Exposed Structure Granulation Quality: Red, Hyper-granulation Fascia Exposed: No Necrotic Amount: Small (1-33%) Fat Layer (Subcutaneous Tissue) Exposed: Yes Necrotic Quality: Adherent Slough Tendon Exposed: No Muscle Exposed: No Joint Exposed: No Bone Exposed: No Periwound Skin Texture Texture Color No Abnormalities Noted: Yes No Abnormalities Noted: Yes Moisture Temperature / Pain No Abnormalities Noted: Yes Temperature: No Abnormality Treatment Notes Wound #6 (Foot) Wound Laterality: Right, Lateral Cleanser Normal Saline Discharge Instruction: Cleanse the wound with Normal Saline prior to applying a clean dressing using gauze sponges, not tissue or cotton balls. Soap and Water Discharge Instruction: May shower and wash wound with dial antibacterial soap and water prior to dressing change. Byram Ancillary Kit - 15 Day Supply Discharge Instruction: Use supplies as instructed; Kit contains: (15) Saline Bullets; (15) 3x3 Gauze; 15 pr Gloves Peri-Wound Care Topical Primary Dressing Aquacel Ag Dressing, 2x2 (in/in) Secondary Dressing Bordered Gauze, 2x3.75 in Discharge Instruction: Apply over primary dressing as directed. Secured With Compression Wrap Compression Stockings Environmental education officer) Signed: 05/12/2022 5:44:44 PM  By: Baruch Gouty RN, BSN Entered By: Baruch Gouty on 05/12/2022 07:53:57 -------------------------------------------------------------------------------- Vitals Details Patient Name: Date of Service: [name], [name] 05/12/2022 7:30 A M Medical Record Number: [id number] Patient Account Number: [id number] Date of Birth/Sex: Treating RN: 1971-03-03 (52 y.o. Ernestene Mention Primary Care Amnah Breuer: Arthur Holms Other Clinician: Referring Eusevio Schriver: Treating Jamaris Theard/Extender: Hollice Espy in Treatment: 2 Vital Signs Time Taken: 07:43 Temperature (F): 98 Height (in): 78 Pulse (bpm): 75 Weight (lbs): 336 Respiratory Rate (breaths/min): 18 Body Mass Index (BMI): 38.8 Blood Pressure (mmHg): 129/80 Reference Range: 80 - 120 mg / dl Electronic Signature(s) Signed: 05/12/2022 5:44:44 PM By: Baruch Gouty  RN, BSN Entered By: Baruch Gouty on 05/12/2022 07:43:47 Wight, Melvern Sample ([id number] Page 9 of 9

## 2022-05-19 ENCOUNTER — Ambulatory Visit (HOSPITAL_BASED_OUTPATIENT_CLINIC_OR_DEPARTMENT_OTHER): Payer: 59 | Admitting: Internal Medicine

## 2022-05-26 ENCOUNTER — Encounter (HOSPITAL_BASED_OUTPATIENT_CLINIC_OR_DEPARTMENT_OTHER): Payer: 59 | Admitting: General Surgery

## 2022-05-26 DIAGNOSIS — L97412 Non-pressure chronic ulcer of right heel and midfoot with fat layer exposed: Secondary | ICD-10-CM | POA: Diagnosis not present

## 2022-06-02 ENCOUNTER — Ambulatory Visit (HOSPITAL_BASED_OUTPATIENT_CLINIC_OR_DEPARTMENT_OTHER): Payer: 59 | Admitting: General Surgery

## 2022-06-09 ENCOUNTER — Ambulatory Visit (HOSPITAL_BASED_OUTPATIENT_CLINIC_OR_DEPARTMENT_OTHER): Payer: 59 | Admitting: General Surgery

## 2022-06-17 ENCOUNTER — Encounter (HOSPITAL_BASED_OUTPATIENT_CLINIC_OR_DEPARTMENT_OTHER): Payer: 59 | Attending: General Surgery | Admitting: General Surgery

## 2022-06-17 DIAGNOSIS — Z09 Encounter for follow-up examination after completed treatment for conditions other than malignant neoplasm: Secondary | ICD-10-CM | POA: Diagnosis not present

## 2022-06-17 DIAGNOSIS — L97512 Non-pressure chronic ulcer of other part of 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: Secondary | ICD-10-CM | POA: Insufficient documentation

## 2022-06-17 DIAGNOSIS — M86171 Other acute osteomyelitis, right ankle and foot: Secondary | ICD-10-CM | POA: Insufficient documentation

## 2022-06-17 DIAGNOSIS — G8220 Paraplegia, unspecified: Secondary | ICD-10-CM | POA: Diagnosis not present

## 2022-06-17 DIAGNOSIS — Z6838 Body mass index (BMI) 38.0-38.9, adult: Secondary | ICD-10-CM | POA: Diagnosis not present

## 2022-06-18 NOTE — Progress Notes (Signed)
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 tissue present with a small amount of slough and some callus buildup around the perimeter. The heel ulcer has just a small opening but some undermining is present. There is a lot of callus in this area, as well. 05/12/2022: The right lateral foot ulcer has once again accumulated some hypertrophic granulation tissue. It is otherwise clean with some periwound callus accumulation. The heel ulcer also has built up callus around the edges. 05/26/2022: The heel ulcer is healed. The right lateral foot ulcer is smaller with just some slough accumulation and some callus around the perimeter. 06/17/2022: The right lateral foot ulcer has a layer of hard callus overlying and surrounding it. Once this was removed, the open portion was found to be a couple of millimeters with just a little slough on the surface. Patient History Information obtained from  Patient. Family History No family history of Cancer, Diabetes, Heart Disease, Hereditary Spherocytosis, Hypertension, Kidney Disease, Lung Disease, Seizures, Stroke, Thyroid Problems, Tuberculosis. Social History Never smoker, Marital Status - Widowed, Alcohol Use - Rarely, Drug Use - No History, Caffeine Use - Moderate - coffee. Medical History Eyes Denies history of Cataracts, Glaucoma, Optic Neuritis Ear/Nose/Mouth/Throat Denies history of Chronic sinus problems/congestion, Middle ear problems Hematologic/Lymphatic Patient has history of Anemia Endocrine Denies history of Type I Diabetes, Type II Diabetes Genitourinary Denies history of End Stage Renal Disease Integumentary (Skin) Denies history of History of Burn Musculoskeletal Patient has history of Osteomyelitis Denies history of Gout, Rheumatoid Arthritis, Osteoarthritis Neurologic Patient has history of Paraplegia -  T5 spinal cord injury Oncologic Denies history of Received Chemotherapy, Received Radiation Psychiatric Denies history of Anorexia/bulimia, Confinement Anxiety Hospitalization/Surgery History - muscle flap. Medical A Surgical History Notes nd Constitutional Symptoms (General Health) morbid obesity Cardiovascular hyperlipidemia Endocrine hypothyroidism Genitourinary in and out cath, neurogenic bladder, urethral stricture Objective Constitutional Hypertensive, asymptomatic. no acute distress. Vitals Time Taken: 8:35 AM, Height: 78 in, Weight: 336 lbs, BMI: 38.8, Temperature: 97.7 F, Pulse: 76 bpm, Respiratory Rate: 18 breaths/min, Blood Pressure: 156/85 mmHg. Respiratory Normal work of breathing on room air. General Notes: 06/17/2022: The right lateral foot ulcer has a layer of hard callus overlying and surrounding it. Once this was removed, the open portion was found to be a couple of millimeters with just a little slough on the surface. KEYSHUN, ELPERS ([id number])  [id number] Page 7 of 9 Integumentary (Hair, Skin) Wound #6 status is Open. Original cause of wound was Trauma. The date acquired was: 11/15/2021. The wound has been in treatment 7 weeks. The wound is located on the Right,Lateral Foot. The wound measures 0.4cm length x 0.5cm width x 0.1cm depth; 0.157cm^2 area and 0.016cm^3 volume. There is Fat Layer (Subcutaneous Tissue) exposed. There is no tunneling or undermining noted. There is a medium amount of serosanguineous drainage noted. The wound margin is distinct with the outline attached to the wound base. There is large (67-100%) red, hyper - granulation within the wound bed. There is a small (1-33%) amount of necrotic tissue within the wound bed including Adherent Slough. The periwound skin appearance had no abnormalities noted for texture. The periwound skin appearance had no abnormalities noted for moisture. The periwound skin appearance had no abnormalities noted for color. Periwound temperature was noted as No Abnormality. Assessment Active Problems ICD-10 Non-pressure chronic ulcer of other part of 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 Morbid (severe) obesity due to excess calories Paraplegia, unspecified Other acute osteomyelitis, right ankle and foot Procedures Wound #6 Pre-procedure diagnosis of Wound #6 is a Neuropathic Ulcer-Non Diabetic located on the Right,Lateral Foot . There was a Selective/Open Wound Non-Viable Tissue Debridement with a total area of 1.3 sq cm performed by Duanne Guess, MD. With the following instrument(s): Curette to remove Non-Viable tissue/material. Material removed includes Eschar and Slough and. No specimens were taken. A time out was conducted at 08:50, prior to the start of the procedure. A Minimum amount of bleeding was controlled with Pressure. The procedure was tolerated well with a pain level of Insensate throughout and a pain level of Insensate following the procedure. Post  Debridement Measurements: 0.3cm length x 0.3cm width x 0.1cm depth; 0.007cm^3 volume. Character of Wound/Ulcer Post Debridement is improved. Post procedure Diagnosis Wound #6: Same as Pre-Procedure General Notes: scribed for Dr. Lady Gary by Zenaida Deed, RN. Plan Follow-up Appointments: Return Appointment in 2 weeks. - Dr. Lady Gary RM 1 Bathing/ Shower/ Hygiene: May shower and wash wound with soap and water. Edema Control - Lymphedema / SCD / Other: Patient to wear own compression stockings every day. Moisturize legs daily. Off-Loading: Other: - floats heel with pillows under the calves while in bed WOUND #6: - Foot Wound Laterality: Right, Lateral Cleanser: Normal Saline 1 x Per Day/30 Days Discharge Instructions: Cleanse the wound with Normal Saline prior to applying a clean dressing using gauze sponges, not tissue or cotton balls. Cleanser: Soap and Water 1 x Per Day/30 Days Discharge Instructions: May shower and wash wound with dial antibacterial soap and water prior to dressing change. Cleanser: [employer] Ancillary Kit - 15 Day Supply (Generic)  1 x Per Day/30 Days Discharge Instructions: Use supplies as instructed; Kit contains: (15) Saline Bullets; (15) 3x3 Gauze; 15 pr Gloves Prim Dressing: Aquacel Ag Dressing, 2x2 (in/in) (Dispense As Written) 1 x Per Day/30 Days ary Secondary Dressing: Bordered Gauze, 2x3.75 in (DME) (Generic) 1 x Per Day/30 Days Discharge Instructions: Apply over primary dressing as directed. 06/17/2022: The right lateral foot ulcer has a layer of hard callus overlying and surrounding it. Once this was removed, the open portion was found to be a couple of millimeters with just a little slough on the surface. I used a curette to debride callus, eschar, and slough from his wound. We will continue silver alginate to the site. He will follow-up in 2 weeks. I anticipate he will likely be completely healed at that time. Electronic Signature(s) Signed: 06/17/2022 9:34:43 AM  By: Duanne Guess MD FACS Entered By: Duanne Guess on 06/17/2022 09:34:42 Huegel, [name] Major ([id number]) [id number] Page 8 of 9 -------------------------------------------------------------------------------- HxROS Details Patient Name: Date of Service: [name], [name] 06/17/2022 8:15 A M Medical Record Number: [id number] Patient Account Number: [id number] Date of Birth/Sex: Treating RN: 18-Nov-1970 (52 y.o. M) Primary Care Provider: [name] Other Clinician: Referring Provider: Treating Provider/Extender: [name] in Treatment: 7 Information Obtained From Patient Constitutional Symptoms (General Health) Medical History: Past Medical History Notes: morbid obesity Eyes Medical History: Negative for: Cataracts; Glaucoma; Optic Neuritis Ear/Nose/Mouth/Throat Medical History: Negative for: Chronic sinus problems/congestion; Middle ear problems Hematologic/Lymphatic Medical History: Positive for: Anemia Cardiovascular Medical History: Past Medical History Notes: hyperlipidemia Endocrine Medical History: Negative for: Type I Diabetes; Type II Diabetes Past Medical History Notes: hypothyroidism Genitourinary Medical History: Negative for: End Stage Renal Disease Past Medical History Notes: in and out cath, neurogenic bladder, urethral stricture Integumentary (Skin) Medical History: Negative for: History of Burn Musculoskeletal Medical History: Positive for: Osteomyelitis Negative for: Gout; Rheumatoid Arthritis; Osteoarthritis Neurologic Medical History: Positive for: Paraplegia - T5 spinal cord injury Oncologic Medical History: Negative for: Received Chemotherapy; Received Radiation Psychiatric Medical History: Negative for: Anorexia/bulimia; Confinement Anxiety Dietze, Aslan L ([id number]) [id number] Page 9 of 9 Immunizations Pneumococcal Vaccine: Received Pneumococcal  Vaccination: No Implantable Devices None Hospitalization / Surgery History Type of Hospitalization/Surgery muscle flap Family and Social History Cancer: No; Diabetes: No; Heart Disease: No; Hereditary Spherocytosis: No; Hypertension: No; Kidney Disease: No; Lung Disease: No; Seizures: No; Stroke: No; Thyroid Problems: No; Tuberculosis: No; Never smoker; Marital Status - Widowed; Alcohol Use: Rarely; Drug Use: No History; Caffeine Use: Moderate - coffee; Financial Concerns: No; Food, Clothing or Shelter Needs: No; Support System Lacking: No; Transportation Concerns: No Electronic Signature(s) Signed: 06/17/2022 10:57:43 AM By: Duanne Guess MD FACS Entered By: Duanne Guess on 06/17/2022 09:33:31 -------------------------------------------------------------------------------- SuperBill Details Patient Name: Date of Service: [name], [name] 06/17/2022 Medical Record Number: [id number] Patient Account Number: [id number] Date of Birth/Sex: Treating RN: Jun 05, 1970 (52 y.o. M) Primary Care Provider: [name] Other Clinician: Referring Provider: Treating Provider/Extender: [name] in Treatment: 7 Diagnosis Coding ICD-10 Codes Code Description [phone] Non-pressure chronic ulcer of other part of 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 E66.01 Morbid (severe) obesity due to excess calories G82.20 Paraplegia, unspecified M86.171 Other acute osteomyelitis, right ankle and foot Facility Procedures : CPT4 Code: [id number] Description: 97597 - DEBRIDE WOUND 1ST 20 SQ CM OR < ICD-10 Diagnosis Description L97.512 Non-pressure chronic ulcer of other part of 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 Modifier: Quantity: 1 Physician Procedures : CPT4 Code Description Modifier  [id number] - WC PHYS LEVEL 3 - EST PT 25 ICD-10 Diagnosis Description L97.512 Non-pressure chronic ulcer of other part of 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 G82.20 Paraplegia, unspecified M86.171 Other acute   osteomyelitis, right ankle and foot E66.01 Morbid (severe) obesity due to excess calories Quantity: 1 : [id number] 97597 - WC PHYS DEBR WO ANESTH 20 SQ CM ICD-10 Diagnosis Description L97.512 Non-pressure chronic ulcer of other part of 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 Quantity: 1 Electronic Signature(s) Signed: 06/17/2022 9:35:09 AM By: Duanne Guess MD FACS Entered By: Duanne Guess on 06/17/2022 09:35:09

## 2022-06-19 NOTE — Progress Notes (Signed)
[name], [name] ([id number]) [id number] Page 1 of 7 Visit Report for 05/26/2022 Arrival Information Details Patient Name: Date of Service: [name], [place] 05/26/2022 8:15 A M Medical Record Number: [id number] Patient Account Number: [id number] Date of Birth/Sex: Treating RN: 1970-10-20 (52 y.o. [name] Primary Care [name]: [name] Other Clinician: Referring Kamaria Lucia: Treating Shakevia Sarris/Extender: [name] in Treatment: 4 Visit Information History Since Last Visit All ordered tests and consults were completed: Yes Patient Arrived: Wheel Chair Added or deleted any medications: No Arrival Time: 08:21 Any new allergies or adverse reactions: No Accompanied By: self Had a fall or experienced change in No Transfer Assistance: EasyPivot Patient Lift activities of daily living that may affect Patient Identification Verified: Yes risk of falls: Secondary Verification Process Completed: Yes Signs or symptoms of abuse/neglect since last visito No Patient Requires Transmission-Based Precautions: No Hospitalized since last visit: No Patient Has Alerts: Yes Implantable device outside of the clinic excluding No Patient Alerts: R ABI= 1.16, [name] ABI= 1.09 cellular tissue based products placed in the center since last visit: Has Dressing in Place as Prescribed: Yes Pain Present Now: No Electronic Signature(s) Signed: 06/19/2022 1:55:51 PM By: Brenton Grills Entered By: Brenton Grills on 05/26/2022 08:21:53 -------------------------------------------------------------------------------- Encounter Discharge Information Details Patient Name: Date of Service: [name], [name] 05/26/2022 8:15 A M Medical Record Number: [id number] Patient Account Number: [id number] Date of Birth/Sex: Treating RN: [birth year] (52 y.o. [name] Primary Care [name]: [name] Other Clinician: Referring Naw Lasala: Treating [name]/Extender:  [name] in Treatment: 4 Encounter Discharge Information Items Post Procedure Vitals Discharge Condition: Stable Temperature (F): 97.7 Ambulatory Status: Wheelchair Pulse (bpm): 62 Discharge Destination: Home Respiratory Rate (breaths/min): 18 Transportation: Private Auto Blood Pressure (mmHg): 132/82 Accompanied By: self Schedule Follow-up Appointment: Yes Clinical Summary of Care: Patient Declined Electronic Signature(s) Signed: 06/19/2022 1:55:51 PM By: Brenton Grills Entered By: Brenton Grills on 05/26/2022 08:54:17 -------------------------------------------------------------------------------- Lower Extremity Assessment Details Patient Name: Date of Service: [name], [name] 05/26/2022 8:15 A M Medical Record Number: [id number] Patient Account Number: [id number] Date of Birth/Sex: Treating RN: 1970-04-04 (52 y.o. [name] Primary Care Tyreon Frigon: [name] Other Clinician: Referring Shuronda Santino: Treating Markeria Goetsch/Extender: [name] in Treatment: 4 Edema Assessment D[Left: [name] ([id number])] Franne Forts: [id number] Page 2 of 7] Assessed: [Left: No] [Right: No] Edema: [Left: Ye] [Right: s] Calf Left: Right: Point of Measurement: From Medial Instep 38.8 cm Ankle Left: Right: Point of Measurement: From Medial Instep 32.3 cm Vascular Assessment Pulses: Dorsalis Pedis Palpable: [Right:Yes] Electronic Signature(s) Signed: 06/19/2022 1:55:51 PM By: Brenton Grills Entered By: Brenton Grills on 05/26/2022 08:30:31 -------------------------------------------------------------------------------- Multi Wound Chart Details Patient Name: Date of Service: [name], [name] 05/26/2022 8:15 A M Medical Record Number: [id number] Patient Account Number: [id number] Date of Birth/Sex: Treating RN: 1970-06-18 (52 y.o. 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 Primary Etiology: Anemia, Osteomyelitis, Paraplegia Anemia, Osteomyelitis, Paraplegia N/A Comorbid History: 11/15/2021 11/15/2021 N/A Date Acquired: 4 4 N/A Weeks of Treatment: Open Open N/A Wound Status: No No N/A Wound Recurrence: 0x0x0 0.9x0.7x0.2 N/A Measurements [name] x W  x D (cm) 0 0.495 N/A A (cm) : rea 0 0.099 N/A Volume (cm) : 100.00% 71.50% N/A % Reduction in A rea: 100.00% 71.50% N/A % Reduction in Volume: Full Thickness Without Exposed Full Thickness Without Exposed N/A Classification: Support Structures Support Structures None Present Medium N/A Exudate Amount: N/A Serosanguineous N/A Exudate Type: N/A red, brown N/A Exudate Color: Distinct, outline attached Distinct, outline attached N/A Wound Margin: None Present (0%) Large (67-100%) N/A Granulation Amount: N/A Red, Hyper-granulation N/A Granulation Quality: None Present (0%) Small (1-33%) N/A Necrotic Amount: Fascia: No Fat Layer (Subcutaneous Tissue): Yes N/A Exposed Structures: Fat Layer (Subcutaneous Tissue): No Fascia: No [name], [name] ([id number]) [id number] Page 3 of 7 Tendon: No Tendon: No Muscle: No Muscle: No Joint: No Joint: No Bone: No Bone: No Small (1-33%) Small (1-33%) N/A Epithelialization: N/A Debridement - Excisional N/A Debridement: Pre-procedure Verification/Time Out N/A 08:43 N/A Taken: N/A Lidocaine 4% Topical Solution N/A Pain Control: N/A Subcutaneous, Slough N/A Tissue Debrided: N/A  Skin/Subcutaneous Tissue N/A Level: N/A 0.63 N/A Debridement A (sq cm): rea N/A Curette N/A Instrument: N/A Minimum N/A Bleeding: N/A Pressure N/A Hemostasis A chieved: N/A 0 N/A Procedural Pain: N/A 0 N/A Post Procedural Pain: N/A Procedure was tolerated well N/A Debridement Treatment Response: N/A 0.9x0.7x0.2 N/A Post Debridement Measurements [name] x W x D (cm) N/A 0.099 N/A Post Debridement Volume: (cm) Callus: Yes No Abnormalities Noted N/A Periwound Skin Texture: No Abnormalities Noted No Abnormalities Noted N/A Periwound Skin Moisture: No Abnormalities Noted No Abnormalities Noted N/A Periwound Skin Color: No Abnormality No Abnormality N/A Temperature: N/A Debridement N/A Procedures Performed: Treatment Notes Electronic Signature(s) Signed: 05/26/2022 8:47:15 AM By: Duanne Guess MD FACS Entered By: Duanne Guess on 05/26/2022 08:47:15 -------------------------------------------------------------------------------- Multi-Disciplinary Care Plan Details Patient Name: Date of Service: [name], [name] 05/26/2022 8:15 A M Medical Record Number: [id number] Patient Account Number: [id number] Date of Birth/Sex: Treating RN: 20-Jul-1970 (52 y.o. [name] Primary Care Beda Dula: [name] Other Clinician: Referring Lerone Onder: Treating Trennon Torbeck/Extender: [name] in Treatment: 4 Multidisciplinary Care Plan reviewed with physician Active Inactive Pressure Nursing Diagnoses: Knowledge deficit related to management of pressures ulcers Potential for impaired tissue integrity related to pressure, friction, moisture, and shear Goals: Patient/caregiver will verbalize understanding of pressure ulcer management Date Initiated: 05/06/2022 Target Resolution Date: 06/09/2022 Goal Status: Active Interventions: Assess: immobility, friction, shearing, incontinence upon admission and as needed Notes: Wound/Skin Impairment Nursing  Diagnoses: Impaired tissue integrity Knowledge deficit related to ulceration/compromised skin integrity Goals: Patient/caregiver will verbalize understanding of skin care regimen Date Initiated: 04/28/2022 Target Resolution Date: 06/10/2022 Goal Status: Active Ulcer/skin breakdown will have a volume reduction of 30% by week 4 [name], [name] ([id number]) [id number] Page 4 of 7 Date Initiated: 04/28/2022 Target Resolution Date: 06/10/2022 Goal Status: Active Interventions: Assess patient/caregiver ability to obtain necessary supplies Assess patient/caregiver ability to perform ulcer/skin care regimen upon admission and as needed Assess ulceration(s) every visit Provide education on ulcer and skin care Treatment Activities: Skin care regimen initiated : 04/28/2022 Topical wound management initiated : 04/28/2022 Notes: Electronic Signature(s) Signed: 06/19/2022 1:55:51 PM By: Brenton Grills Entered By: Brenton Grills on 05/26/2022 08:41:55 -------------------------------------------------------------------------------- Pain Assessment Details Patient Name: Date of Service: [name], [name] 05/26/2022 8:15 A M Medical Record Number: [id number] Patient Account Number: [id number] Date of Birth/Sex: Treating RN: 1970-07-24 (52 y.o. [name] Primary Care Ronnica Dreese: [name] Other Clinician: Referring Kutler Vanvranken: Treating Veneda Kirksey/Extender: [name] in Treatment: 4  Active Problems Location of Pain Severity and Description of Pain Patient Has Paino No Site Locations Pain Management and Medication Current Pain Management: Electronic Signature(s) Signed: 06/19/2022 1:55:51 PM By: Brenton Grills Entered By: Brenton Grills on 05/26/2022 08:25:04 -------------------------------------------------------------------------------- Patient/Caregiver Education Details Patient Name: Date of Service: [name], [place] Aldine Contes 3/25/2024andnbsp8:15 A  M Medical Record Number: [id number] Patient Account Number: [id number] Date of Birth/Gender: Treating RN: 08-Mar-1970 (52 y.o. [name] Primary Care Physician: [name] Other Clinician: Referring Physician: Treating Physician/Extender: [name] in Treatment: 4 Row, [name] Major ([id number]) [id number] Page 5 of 7 Education Assessment Education Provided To: Patient Education Topics Provided Wound/Skin Impairment: Handouts: Caring for Your Ulcer Methods: Explain/Verbal Responses: Reinforcements needed, State content correctly Electronic Signature(s) Signed: 06/19/2022 1:55:51 PM By: Brenton Grills Entered By: Brenton Grills on 05/26/2022 08:42:20 -------------------------------------------------------------------------------- Wound Assessment Details Patient Name: Date of Service: [name], [name] 05/26/2022 8:15 A M Medical Record Number: [id number] Patient Account Number: [id number] Date of Birth/Sex: Treating RN: November 17, 1970 (52 y.o. 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 Wound Status: Open Wounding Event: Trauma Comorbid History: Anemia, Osteomyelitis, Paraplegia Date Acquired: 11/15/2021 Weeks Of Treatment: 4 Clustered Wound: No Photos Wound Measurements Length: (cm) Width: (cm) Depth: (cm) Area: (cm) Volume: (cm) 0 % Reduction in Area: 100% 0 % Reduction in Volume: 100% 0 Epithelialization: Small (1-33%) 0 Tunneling: No 0 Undermining: No Wound Description Classification: Full Thickness Without Exposed Support Structures Wound Margin: Distinct, outline attached Exudate Amount: None Present Foul Odor After Cleansing: No Slough/Fibrino Yes Wound Bed Granulation  Amount: None Present (0%) Exposed Structure Necrotic Amount: None Present (0%) Fascia Exposed: No Fat Layer (Subcutaneous Tissue) Exposed: No Tendon Exposed: No Muscle Exposed: No Joint Exposed: No Bone Exposed: No [name], [name] ([id number]) [id number] Page 6 of 7 Periwound Skin Texture Texture Color No Abnormalities Noted: No No Abnormalities Noted: Yes Callus: Yes Temperature / Pain Temperature: No Abnormality Moisture No Abnormalities Noted: Yes Electronic Signature(s) Signed: 06/19/2022 1:55:51 PM By: Brenton Grills Entered By: Brenton Grills on 05/26/2022 08:36:15 -------------------------------------------------------------------------------- Wound Assessment Details Patient Name: Date of Service: [name], [name] 05/26/2022 8:15 A M Medical Record Number: [id number] Patient Account Number: [id number] Date of Birth/Sex: Treating RN: [birth year] (52 y.o. 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, Lateral Foot Wound Status: Open Wounding Event: Trauma Comorbid History: Anemia, Osteomyelitis, Paraplegia Date Acquired: 11/15/2021 Weeks Of Treatment: 4 Clustered Wound: No Photos Wound Measurements Length: (cm) 0.9 Width: (cm) 0.7 Depth: (cm) 0.2 Area: (cm) 0.495 Volume: (cm) 0.099 % Reduction in Area: 71.5% % Reduction in Volume: 71.5% Epithelialization: Small (1-33%) Tunneling: No Undermining: No Wound Description Classification: Full Thickness Without Exposed Support Structures Wound Margin: Distinct, outline attached Exudate Amount: Medium Exudate Type: Serosanguineous Exudate Color: red, brown Foul Odor After Cleansing: No Slough/Fibrino Yes Wound Bed Granulation Amount: Large (67-100%) Exposed  Structure Granulation Quality: Red, Hyper-granulation Fascia Exposed: No Necrotic Amount: Small (1-33%) Fat Layer (Subcutaneous Tissue) Exposed: Yes Necrotic Quality: Adherent Slough Tendon Exposed: No Muscle Exposed: No Joint Exposed: No Bone Exposed: No Periwound Skin Texture Texture Color No Abnormalities Noted: Yes No Abnormalities Noted: Yes Moisture Temperature /  Pain [name], [name] ([id number]) [id number] Page 7 of 7 No Abnormalities Noted: Yes Temperature: No Abnormality Electronic Signature(s) Signed: 06/19/2022 1:55:51 PM By: Brenton Grills Entered By: Brenton Grills on 05/26/2022 08:35:55 -------------------------------------------------------------------------------- Vitals Details Patient Name: Date of Service: [name], [name] 05/26/2022 8:15 A M Medical Record Number: [id number] Patient Account Number: [id number] Date of Birth/Sex: Treating RN: [birth year] (52 y.o. [name] Primary Care Brandii Lakey: [name] Other Clinician: Referring Ariyon Gerstenberger: Treating Tiona Ruane/Extender: [name] in Treatment: 4 Vital Signs Time Taken: 08:22 Temperature (F): 97.7 Height (in): 78 Pulse (bpm): 62 Weight (lbs): 336 Respiratory Rate (breaths/min): 18 Body Mass Index (BMI): 38.8 Blood Pressure (mmHg): 132/82 Reference Range: 80 - 120 mg / dl Electronic Signature(s) Signed: 06/19/2022 1:55:51 PM By: Brenton Grills Entered By: Brenton Grills on 05/26/2022 08:24:53

## 2022-06-19 NOTE — Progress Notes (Signed)
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 a lot of callus in this area, as well. 05/12/2022: The right lateral foot ulcer has once again accumulated some hypertrophic granulation tissue. It [name] otherwise clean with some periwound callus accumulation. The heel ulcer also has built up callus around the edges. 05/26/2022: The heel ulcer [name] healed. The right lateral foot ulcer [name] smaller with just some slough accumulation and some callus around the perimeter. Patient History Information obtained from Patient. Family History No family history of Cancer, Diabetes, Heart Disease, Hereditary Spherocytosis, Hypertension, Kidney Disease, Lung Disease, Seizures, Stroke, Thyroid Problems, Tuberculosis. Social History Never smoker, Marital Status - Widowed, Alcohol Use - Rarely, Drug Use - No History, Caffeine Use - Moderate - coffee. Medical History Eyes Denies history of Cataracts, Glaucoma, Optic Neuritis Ear/Nose/Mouth/Throat Denies history of Chronic sinus problems/congestion, Middle ear  problems Hematologic/Lymphatic Patient has history of Anemia Endocrine Denies history of Type I Diabetes, Type II Diabetes Genitourinary Denies history of End Stage Renal Disease Integumentary (Skin) Denies history of History of Burn Musculoskeletal Patient has history of Osteomyelitis Denies history of Gout, Rheumatoid Arthritis, Osteoarthritis Neurologic Patient has history of Paraplegia - T5 spinal cord injury Oncologic Denies history of Received Chemotherapy, Received Radiation Psychiatric Denies history of Anorexia/bulimia, Confinement Anxiety Hospitalization/Surgery History - muscle flap. Medical A Surgical History Notes nd Constitutional Symptoms (General Health) morbid obesity Cardiovascular hyperlipidemia Endocrine hypothyroidism Genitourinary in and out cath, neurogenic bladder, urethral stricture Objective Constitutional no acute distress. Vitals Time Taken: 8:22 AM, Height: 78 in, Weight: 336 lbs, BMI: 38.8, Temperature: 97.7 F, Pulse: 62 bpm,  Respiratory Rate: 18 breaths/min, Blood Pressure: 132/82 mmHg. Respiratory Normal work of breathing on room air. General Notes: 05/26/2022: The heel ulcer [name] healed. The right lateral foot ulcer [name] smaller with just some slough accumulation and some callus around the perimeter. Integumentary (Hair, Skin) Wound #5 status [name] Open. Original cause of wound was Trauma. The date acquired was: 11/15/2021. The wound has been in treatment 4 weeks. The wound [name] located on the Right Calcaneus. The wound measures 0cm length x 0cm width x 0cm depth; 0cm^2 area and 0cm^3 volume. There [name] no tunneling or EAGAN, SHIFFLETT ([id number]) [phone].pdf Page 7 of 10 undermining noted. There [name] a none present amount of drainage noted. The wound margin [name] distinct with the outline attached to the wound base. There [name] no granulation within the wound bed. There [name] no necrotic tissue within the wound bed. The periwound skin  appearance had no abnormalities noted for moisture. The periwound skin appearance had no abnormalities noted for color. The periwound skin appearance exhibited: Callus. Periwound temperature was noted as No Abnormality. Wound #6 status [name] Open. Original cause of wound was Trauma. The date acquired was: 11/15/2021. The wound has been in treatment 4 weeks. The wound [name] located on the Right,Lateral Foot. The wound measures 0.9cm length x 0.7cm width x 0.2cm depth; 0.495cm^2 area and 0.099cm^3 volume. There [name] Fat Layer (Subcutaneous Tissue) exposed. There [name] no tunneling or undermining noted. There [name] a medium amount of serosanguineous drainage noted. The wound margin [name] distinct with the outline attached to the wound base. There [name] large (67-100%) red, hyper - granulation within the wound bed. There [name] a small (1-33%) amount of necrotic tissue within the wound bed including Adherent Slough. The periwound skin appearance had no abnormalities noted for texture. The periwound skin appearance had no abnormalities noted for moisture. The periwound skin appearance had no abnormalities noted for color. Periwound temperature was noted as No Abnormality. Assessment Active Problems ICD-10 Non-pressure chronic ulcer of other part of 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 Morbid (severe) obesity due to excess calories Paraplegia, unspecified Other acute osteomyelitis, right ankle and foot Procedures Wound #6 Pre-procedure diagnosis of Wound #6 [name] a Neuropathic Ulcer-Non Diabetic located on the Right,Lateral Foot . There was a Excisional Skin/Subcutaneous Tissue Debridement with a total area of 0.63 sq cm performed by Duanne Guess, MD. With the following instrument(s): Curette to remove Viable and Non-Viable tissue/material. Material removed includes Subcutaneous Tissue and Slough and after achieving pain control using Lidocaine 4% T opical Solution. No specimens were taken. A time out was conducted at 08:43,  prior to the start of the procedure. A Minimum amount of bleeding was controlled with Pressure. The procedure was tolerated well with a pain level of 0 throughout and a pain level of 0 following the procedure. Post Debridement Measurements: 0.9cm length x 0.7cm width x 0.2cm depth; 0.099cm^3 volume. Character of Wound/Ulcer Post Debridement [name] improved. Post procedure Diagnosis Wound #6: Same as Pre-Procedure General Notes: Scribed by Brenton Grills RN for Dr. Lady Gary.. Plan Follow-up Appointments: Return Appointment in 2 weeks. - Dr. Lady Gary RM 1 Monday 4/8 @ 0815 am Bathing/ Shower/ Hygiene: May shower and wash wound with soap and water. Edema Control - Lymphedema / SCD / Other: Patient to wear own compression stockings every day. Moisturize legs daily. Off-Loading: Other: - floats heel with pillows under the calves while in bed WOUND #6: - Foot Wound Laterality: Right, Lateral Cleanser: Normal Saline 1 x Per Day/30 Days Discharge Instructions: Cleanse  the wound with Normal Saline prior to applying a clean dressing using gauze sponges, not tissue or cotton balls. Cleanser: Soap and Water 1 x Per Day/30 Days Discharge Instructions: May shower and wash wound with dial antibacterial soap and water prior to dressing change. Cleanser: Byram Ancillary Kit - 15 Day Supply (Generic) 1 x Per Day/30 Days Discharge Instructions: Use supplies as instructed; Kit contains: (15) Saline Bullets; (15) 3x3 Gauze; 15 pr Gloves Prim Dressing: Aquacel Ag Dressing, 2x2 (in/in) (DME) (Dispense As Written) 1 x Per Day/30 Days ary Secondary Dressing: Bordered Gauze, 2x3.75 in (DME) (Generic) 1 x Per Day/30 Days Discharge Instructions: Apply over primary dressing as directed. 05/26/2022: The heel ulcer [name] healed. The right lateral foot ulcer [name] smaller with just some slough accumulation and some callus around the perimeter. I used a curette to debride slough, callus, and subcutaneous tissue from the lateral foot  ulcer. We will continue silver alginate to the site. Follow-up in 2 weeks. Electronic Signature(s) Signed: 05/28/2022 9:16:27 AM By: Duanne Guess MD FACS Signed: 05/28/2022 5:29:18 PM By: Shawn Stall RN, BSN Previous Signature: 05/26/2022 8:49:02 AM Version By: Duanne Guess MD FACS Entered By: Shawn Stall on 05/28/2022 08:11:33 [name], [name] ([id number]) [id number] Page 8 of 10 -------------------------------------------------------------------------------- HxROS Details Patient Name: Date of Service: [name], [name]. 05/26/2022 8:15 A M Medical Record Number: [id number] Patient Account Number: [id number] Date of Birth/Sex: Treating RN: 16-Feb-1971 (52 y.o. M) Primary Care Provider: [name] Other Clinician: Referring Provider: Treating Provider/Extender: [name] in Treatment: 4 Information Obtained From Patient Constitutional Symptoms (General Health) Medical History: Past Medical History Notes: morbid obesity Eyes Medical History: Negative for: Cataracts; Glaucoma; Optic Neuritis Ear/Nose/Mouth/Throat Medical History: Negative for: Chronic sinus problems/congestion; Middle ear problems Hematologic/Lymphatic Medical History: Positive for: Anemia Cardiovascular Medical History: Past Medical History Notes: hyperlipidemia Endocrine Medical History: Negative for: Type I Diabetes; Type II Diabetes Past Medical History Notes: hypothyroidism Genitourinary Medical History: Negative for: End Stage Renal Disease Past Medical History Notes: in and out cath, neurogenic bladder, urethral stricture Integumentary (Skin) Medical History: Negative for: History of Burn Musculoskeletal Medical History: Positive for: Osteomyelitis Negative for: Gout; Rheumatoid Arthritis; Osteoarthritis Neurologic Medical History: Positive for: Paraplegia - T5 spinal cord injury Oncologic Medical History: Negative for: Received  Chemotherapy; Received Radiation Psychiatric [name], [name] ([id number]) [id number] Page 9 of 10 Medical History: Negative for: Anorexia/bulimia; Confinement Anxiety Immunizations Pneumococcal Vaccine: Received Pneumococcal Vaccination: No Implantable Devices None Hospitalization / Surgery History Type of Hospitalization/Surgery muscle flap Family and Social History Cancer: No; Diabetes: No; Heart Disease: No; Hereditary Spherocytosis: No; Hypertension: No; Kidney Disease: No; Lung Disease: No; Seizures: No; Stroke: No; Thyroid Problems: No; Tuberculosis: No; Never smoker; Marital Status - Widowed; Alcohol Use: Rarely; Drug Use: No History; Caffeine Use: Moderate - coffee; Financial Concerns: No; Food, Clothing or Shelter Needs: No; Support System Lacking: No; Transportation Concerns: No Electronic Signature(s) Signed: 05/26/2022 9:28:06 AM By: Duanne Guess MD FACS Entered By: Duanne Guess on 05/26/2022 08:48:01 -------------------------------------------------------------------------------- SuperBill Details Patient Name: Date of Service: [name], [name]. 05/26/2022 Medical Record Number: [id number] Patient Account Number: [id number] Date of Birth/Sex: Treating RN: May 09, 1970 (52 y.o. M) Primary Care Provider: [name] Other Clinician: Referring Provider: Treating Provider/Extender: [name] in Treatment: 4 Diagnosis Coding ICD-10 Codes Code Description [phone] Non-pressure chronic ulcer of other part of 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 E66.01 Morbid (severe) obesity due to excess calories G82.20 Paraplegia, unspecified M86.171 Other  acute osteomyelitis, right ankle and foot Facility Procedures : CPT4 Code: [id number] Description: 11042 - DEB SUBQ TISSUE 20 SQ CM/< ICD-10 Diagnosis Description L97.512 Non-pressure chronic ulcer of other part of 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 Modifier: Quantity: 1 Physician  Procedures : CPT4 Code Description Modifier [id number] 99213 - WC PHYS LEVEL 3 - EST PT 25 ICD-10 Diagnosis Description L97.512 Non-pressure chronic ulcer of other part of 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 G82.20 Paraplegia, unspecified M86.171 Other acute  osteomyelitis, right ankle and foot E66.01 Morbid (severe) obesity due to excess calories Quantity: 1 : [id number] 11042 - WC PHYS SUBQ TISS 20 SQ CM ICD-10 Diagnosis Description L97.512 Non-pressure chronic ulcer of other part of 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 Quantity: 1 Electronic Signature(s) Signed: 05/26/2022 8:49:18 AM By: Duanne Guess MD FACS Entered By: Duanne Guess on 05/26/2022 08:49:17 Biskup, [name] ([id number]) [id number] Page 10 of 10

## 2022-06-19 NOTE — Progress Notes (Signed)
[name], [name] ([id number]) [id number] Page 1 of 6 Visit Report for 06/17/2022 Arrival Information Details Patient Name: Date of Service: [name], [place] 06/17/2022 8:15 A M Medical Record Number: [id number] Patient Account Number: [id number] Date of Birth/Sex: Treating RN: [birth year] (52 y.o. [name] Primary Care Cerrone Debold: [name] Other Clinician: Referring Landree Fernholz: Treating Chantay Whitelock/Extender: [name] in Treatment: 7 Visit Information History Since Last Visit All ordered tests and consults were completed: Yes Patient Arrived: Wheel Chair Added or deleted any medications: No Arrival Time: 08:28 Any new allergies or adverse reactions: No Accompanied By: self Had a fall or experienced change in No Transfer Assistance: EasyPivot Patient Lift activities of daily living that may affect Patient Identification Verified: Yes risk of falls: Secondary Verification Process Completed: Yes Signs or symptoms of abuse/neglect since last visito No Patient Requires Transmission-Based Precautions: No Hospitalized since last visit: No Patient Has Alerts: Yes Implantable device outside of the clinic excluding No Patient Alerts: R ABI= 1.16, L ABI= 1.09 cellular tissue based products placed in the center since last visit: Has Dressing in Place as Prescribed: Yes Pain Present Now: No Electronic Signature(s) Signed: 06/19/2022 1:56:54 PM By: Brenton Grills Entered By: Brenton Grills on 06/17/2022 08:34:39 -------------------------------------------------------------------------------- Lower Extremity Assessment Details Patient Name: Date of Service: [name], [name] 06/17/2022 8:15 A M Medical Record Number: [id number] Patient Account Number: [id number] Date of Birth/Sex: Treating RN: [birth year] (52 y.o. [name] Primary Care Sanika Brosious: [name] Other Clinician: Referring Zella Dewan: Treating Oseas Detty/Extender: [name] in Treatment: 7 Edema Assessment Assessed: Kyra Searles: No] [Right: No] Edema: [Left: Ye] [Right: s] Calf Left: Right: Point of Measurement: From Medial Instep 38.5 cm Ankle Left: Right: Point of Measurement: From Medial Instep 33 cm Vascular Assessment Pulses: Dorsalis Pedis Palpable: [Right:Yes] Electronic Signature(s) Signed: 06/19/2022 1:56:54 PM By: Brenton Grills Entered By: Brenton Grills on 06/17/2022 08:39:43 [name], [name] Major ([id number]) [id number] Page 2 of 6 -------------------------------------------------------------------------------- Multi Wound Chart Details Patient Name: Date of Service: [name], [name] 06/17/2022 8:15 A M Medical Record Number: [id number] Patient Account Number: [id number] Date of Birth/Sex: Treating RN: [birth year] (53 y.o. M) Primary Care Unknown Flannigan: [name] Other Clinician: Referring Glendell Schlottman: Treating Maleya Leever/Extender: [name] in Treatment: 7 Vital Signs Height(in): 78 Pulse(bpm): 76 Weight(lbs): 336 Blood Pressure(mmHg): 156/85 Body Mass Index(BMI): 38.8 Temperature(F): 97.7 Respiratory Rate(breaths/min): 18 [6:Photos:] [N/A:N/A] Right, Lateral Foot N/A N/A Wound Location: Trauma N/A N/A Wounding Event: Neuropathic Ulcer-Non Diabetic N/A N/A Primary Etiology: Anemia, Osteomyelitis, Paraplegia N/A N/A Comorbid History: 11/15/2021 N/A N/A Date Acquired: 7 N/A N/A Weeks of Treatment: Open N/A N/A Wound Status: No N/A N/A Wound Recurrence: 0.4x0.5x0.1 N/A N/A Measurements L x W x D (cm) 0.157 N/A N/A A (cm) : rea 0.016 N/A N/A Volume (cm) : 91.00% N/A N/A % Reduction in A rea: 95.40% N/A N/A % Reduction in Volume: Full Thickness Without Exposed N/A N/A Classification: Support Structures Medium N/A N/A Exudate A mount: Serosanguineous N/A N/A Exudate Type: red, brown N/A N/A Exudate Color: Distinct, outline attached N/A N/A Wound  Margin: Large (67-100%) N/A N/A Granulation A mount: Red, Hyper-granulation N/A N/A Granulation Quality: Small (1-33%) N/A N/A Necrotic A mount: Fat Layer (Subcutaneous Tissue): Yes N/A N/A Exposed Structures: Fascia: No Tendon: No Muscle: No Joint: No Bone: No Small (1-33%) N/A N/A Epithelialization: Debridement - Selective/Open Wound N/A N/A Debridement: Pre-procedure Verification/Time Out 08:50 N/A N/A Taken: Necrotic/Eschar, Slough N/A N/A Tissue  Debrided: Non-Viable Tissue N/A N/A Level: 1.3 N/A N/A Debridement A (sq cm): rea Curette N/A N/A Instrument: Minimum N/A N/A Bleeding: Pressure N/A N/A Hemostasis A chieved: Insensate N/A N/A Procedural Pain: Insensate N/A N/A Post Procedural Pain: Procedure was tolerated well N/A N/A Debridement Treatment Response: 0.3x0.3x0.1 N/A N/A Post Debridement Measurements L x W x D (cm) 0.007 N/A N/A Post Debridement Volume: (cm) No Abnormalities Noted N/A N/A Periwound Skin Texture: No Abnormalities Noted N/A N/A Periwound Skin Moisture: No Abnormalities Noted N/A N/A Periwound Skin Color: No Abnormality N/A N/A Temperature: Debridement N/A N/A Procedures Performed: Treatment Notes OLAOLUWA, GRIEDER ([id number]) [id number] Page 3 of 6 Electronic Signature(s) Signed: 06/17/2022 9:32:37 AM By: Duanne Guess MD FACS Entered By: Duanne Guess on 06/17/2022 09:32:37 -------------------------------------------------------------------------------- Multi-Disciplinary Care Plan Details Patient Name: Date of Service: [name], [name] 06/17/2022 8:15 A M Medical Record Number: [id number] Patient Account Number: [id number] Date of Birth/Sex: Treating RN: 02-Feb-1971 (52 y.o. [name] Primary Care Jensen Cheramie: [name] Other Clinician: Referring Kerrie Timm: Treating Ibraham Levi/Extender: [name] in Treatment: 7 Multidisciplinary Care Plan reviewed with  physician Active Inactive Pressure Nursing Diagnoses: Knowledge deficit related to management of pressures ulcers Potential for impaired tissue integrity related to pressure, friction, moisture, and shear Goals: Patient/caregiver will verbalize understanding of pressure ulcer management Date Initiated: 05/06/2022 Target Resolution Date: 07/09/2022 Goal Status: Active Interventions: Assess: immobility, friction, shearing, incontinence upon admission and as needed Notes: Wound/Skin Impairment Nursing Diagnoses: Impaired tissue integrity Knowledge deficit related to ulceration/compromised skin integrity Goals: Patient/caregiver will verbalize understanding of skin care regimen Date Initiated: 04/28/2022 Target Resolution Date: 07/09/2022 Goal Status: Active Ulcer/skin breakdown will have a volume reduction of 30% by week 4 Date Initiated: 04/28/2022 Target Resolution Date: 07/09/2022 Goal Status: Active Interventions: Assess patient/caregiver ability to obtain necessary supplies Assess patient/caregiver ability to perform ulcer/skin care regimen upon admission and as needed Assess ulceration(s) every visit Provide education on ulcer and skin care Treatment Activities: Skin care regimen initiated : 04/28/2022 Topical wound management initiated : 04/28/2022 Notes: Electronic Signature(s) Signed: 06/19/2022 1:56:54 PM By: Brenton Grills Entered By: Brenton Grills on 06/17/2022 08:46:50 -------------------------------------------------------------------------------- Pain Assessment Details Patient Name: Date of Service: [name][name] 06/17/2022 8:15 A M Medical Record Number: [id number] Patient Account Number: [id number] Date of Birth/Sex: Treating RN: [birth year] (52 y.o. [place], [place], [name] Major ([id number]) [id number] Page 4 of 6 Primary Care Leanthony Rhett: [name] Other Clinician: Referring Deantre Bourdon: Treating Jiovany Scheffel/Extender: [name] in Treatment: 7 Active Problems Location of Pain Severity and Description of Pain Patient Has Paino No Site Locations Pain Management and Medication Current Pain Management: Electronic Signature(s) Signed: 06/19/2022 1:56:54 PM By: Brenton Grills Entered By: Brenton Grills on 06/17/2022 08:35:43 -------------------------------------------------------------------------------- Patient/Caregiver Education Details Patient Name: Date of Service: [name], [place] Aldine Contes 4/16/2024andnbsp8:15 A M Medical Record Number: [id number] Patient Account Number: [id number] Date of Birth/Gender: Treating RN: [birth year] (52 y.o. [name] Primary Care Physician: [name] Other Clinician: Referring Physician: Treating Physician/Extender: [name] in Treatment: 7 Education Assessment Education Provided To: Patient Education Topics Provided Wound/Skin Impairment: Methods: Explain/Verbal Responses: State content correctly Electronic Signature(s) Signed: 06/19/2022 1:56:54 PM By: Brenton Grills Entered By: Brenton Grills on 06/17/2022 08:47:14 -------------------------------------------------------------------------------- Wound Assessment Details Patient Name: Date of Service: [name] 06/17/2022 8:15 A M Medical Record Number: [id number] Patient Account Number: [id number] Date of Birth/Sex: Treating RN: [birth year] (52 y.o. M)  Brenton Grills Primary Care Fredric Slabach: [name] Other Clinician: Referring Brynnan Rodenbaugh: Treating Eriona Kinchen/Extender: Lambros, Cerro ([id number]) [id number] Page 5 of 6 Weeks in Treatment: 7 Wound Status Wound Number: 6 Primary Etiology: Neuropathic Ulcer-Non Diabetic Wound Location: Right, Lateral Foot Wound Status: Open Wounding Event: Trauma Comorbid History: Anemia, Osteomyelitis, Paraplegia Date Acquired: 11/15/2021 Weeks Of Treatment: 7 Clustered  Wound: No Photos Wound Measurements Length: (cm) 0.4 Width: (cm) 0.5 Depth: (cm) 0.1 Area: (cm) 0.157 Volume: (cm) 0.016 % Reduction in Area: 91% % Reduction in Volume: 95.4% Epithelialization: Small (1-33%) Tunneling: No Undermining: No Wound Description Classification: Full Thickness Without Exposed Support Structures Wound Margin: Distinct, outline attached Exudate Amount: Medium Exudate Type: Serosanguineous Exudate Color: red, brown Foul Odor After Cleansing: No Slough/Fibrino Yes Wound Bed Granulation Amount: Large (67-100%) Exposed Structure Granulation Quality: Red, Hyper-granulation Fascia Exposed: No Necrotic Amount: Small (1-33%) Fat Layer (Subcutaneous Tissue) Exposed: Yes Necrotic Quality: Adherent Slough Tendon Exposed: No Muscle Exposed: No Joint Exposed: No Bone Exposed: No Periwound Skin Texture Texture Color No Abnormalities Noted: Yes No Abnormalities Noted: Yes Moisture Temperature / Pain No Abnormalities Noted: Yes Temperature: No Abnormality Electronic Signature(s) Signed: 06/19/2022 1:56:54 PM By: Brenton Grills Entered By: Brenton Grills on 06/17/2022 08:45:08 -------------------------------------------------------------------------------- Vitals Details Patient Name: Date of Service: [name], [name] 06/17/2022 8:15 A M Medical Record Number: [id number] Patient Account Number: [id number] Date of Birth/Sex: Treating RN: 10-16-1970 (52 y.o. [name] Primary Care Francina Beery: [name] Other Clinician: Referring Cord Wilczynski: Treating Leilene Diprima/Extender: [name] in Treatment: 7 Vital Signs Time Taken: 08:35 Temperature (F): 97.7 Gade, Jerrion L ([id number]) [id number] Page 6 of 6 Height (in): 78 Pulse (bpm): 76 Weight (lbs): 336 Respiratory Rate (breaths/min): 18 Body Mass Index (BMI): 38.8 Blood Pressure (mmHg): 156/85 Reference Range: 80 - 120 mg / dl Electronic  Signature(s) Signed: 06/19/2022 1:56:54 PM By: Brenton Grills Entered By: Brenton Grills on 06/17/2022 08:35:35

## 2022-07-01 ENCOUNTER — Encounter (HOSPITAL_BASED_OUTPATIENT_CLINIC_OR_DEPARTMENT_OTHER): Payer: 59 | Admitting: General Surgery

## 2022-07-01 DIAGNOSIS — Z09 Encounter for follow-up examination after completed treatment for conditions other than malignant neoplasm: Secondary | ICD-10-CM | POA: Diagnosis not present

## 2022-07-02 NOTE — Progress Notes (Signed)
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 been in treatment 9 weeks. The wound is located on the Right,Lateral Foot. The wound measures 0cm length x 0cm width x 0cm depth; 0cm^2 area and 0cm^3 volume. There is no tunneling or undermining noted. There is a none present amount of drainage noted. There is no granulation within the wound bed. There is no necrotic tissue within the wound bed. The periwound skin appearance had no abnormalities noted for texture. The periwound skin appearance had no abnormalities noted for moisture. The periwound skin appearance had no abnormalities noted for color. Periwound temperature was noted as No Abnormality. Assessment Active Problems ICD-10 Non-pressure chronic ulcer of other part of 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 Morbid (severe) obesity due to excess calories Paraplegia, unspecified Other acute osteomyelitis, right ankle and foot Plan Discharge From [facility] Services: Discharge from Wound Care Center Bathing/ Shower/ Hygiene: May  shower and wash wound with soap and water. Edema Control - Lymphedema / SCD / Other: Patient to wear own compression stockings every day. Moisturize legs daily. - legs and feet daily Off-Loading: Other: - floats heel with pillows under the calves while in bed 07/01/2022: His wound is healed. I did recommend that he use a good moisturizer such as Aquaphor, Eucerin, or something similarly bridging emollient to try and minimize the risk of his feet getting dry and cracked. We will discharge him from the wound care center. He may follow-up as needed. Electronic Signature(s) Signed: 07/01/2022 12:14:50 PM By: Duanne Guess MD FACS Entered By: Duanne Guess  on 07/01/2022 12:14:50 -------------------------------------------------------------------------------- HxROS Details Patient Name: Date of Service: [name], [name] 07/01/2022 10:30 A M Medical Record Number: [id number] Patient Account Number: [id number] Date of Birth/Sex: Treating RN: February 01, 1971 (52 y.o. M) Primary Care Provider: [name] Other Clinician: Referring Provider: Treating Provider/Extender: [name] in Treatment: 9 Information Obtained From Patient Constitutional Symptoms (General Health) Medical History: Past Medical History Notes: morbid obesity Eyes Cheese, Detravion L ([id number]) [phone].pdf Page 7 of 8 Medical History: Negative for: Cataracts; Glaucoma; Optic Neuritis Ear/Nose/Mouth/Throat Medical History: Negative for: Chronic sinus problems/congestion; Middle ear problems Hematologic/Lymphatic Medical History: Positive for: Anemia Cardiovascular Medical History: Past Medical History Notes: hyperlipidemia Endocrine Medical History: Negative for: Type I Diabetes; Type II Diabetes Past Medical History Notes: hypothyroidism Genitourinary Medical History: Negative for: End Stage Renal Disease Past Medical History Notes: in and out cath, neurogenic  bladder, urethral stricture Integumentary (Skin) Medical History: Negative for: History of Burn Musculoskeletal Medical History: Positive for: Osteomyelitis Negative for: Gout; Rheumatoid Arthritis; Osteoarthritis Neurologic Medical History: Positive for: Paraplegia - T5 spinal cord injury Oncologic Medical History: Negative for: Received Chemotherapy; Received Radiation Psychiatric Medical History: Negative for: Anorexia/bulimia; Confinement Anxiety Immunizations Pneumococcal Vaccine: Received Pneumococcal Vaccination: No Implantable Devices None Hospitalization / Surgery History Type of Hospitalization/Surgery muscle flap Family and Social History Cancer: No; Diabetes: No; Heart Disease: No; Hereditary Spherocytosis: No; Hypertension: No; Kidney Disease: No; Lung Disease: No; Seizures: No; Stroke: No; Thyroid Problems: No; Tuberculosis: No; Never smoker; Marital Status - Widowed; Alcohol Use: Rarely; Drug Use: No History; Caffeine Use: Moderate - coffee; Financial Concerns: No; Food, Clothing or Shelter Needs: No; Support System Lacking: No; Transportation Concerns: No Electronic Signature(s) KIOWA, HOLLAR ([id number]) [id number] Page 8 of 8 Signed: 07/01/2022 3:54:11 PM By: Duanne Guess MD FACS Entered By: Duanne Guess on 07/01/2022 12:08:34 -------------------------------------------------------------------------------- SuperBill Details Patient Name: Date of Service: [name], [name] 07/01/2022 Medical Record Number: [id number] Patient Account Number: [id number] Date of Birth/Sex: Treating RN: 05/06/1970 (52 y.o. Bayard Hugger, Bonita Quin Primary Care Provider: [name] Other Clinician: Referring Provider: Treating Provider/Extender: [name] in Treatment: 9 Diagnosis Coding ICD-10 Codes Code Description [phone] Non-pressure chronic ulcer of other part of 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 E66.01 Morbid  (severe) obesity due to excess calories G82.20 Paraplegia, unspecified M86.171 Other acute osteomyelitis, right ankle and foot Facility Procedures : CPT4 Code: 64332951 Description: [phone] - WOUND CARE VISIT-LEV 2 EST PT Modifier: Quantity: 1 Physician Procedures : CPT4 Code Description Modifier [id number] - WC PHYS LEVEL 3 - EST PT ICD-10 Diagnosis Description L97.512 Non-pressure chronic ulcer of other part of 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 G82.20 Paraplegia, unspecified E66.01 Morbid (severe) obesity due  to excess calories M86.171 Other acute osteomyelitis, right ankle and foot Quantity: 1 Electronic Signature(s) Signed: 07/01/2022 12:15:02 PM By: Duanne Guess MD FACS Entered By: Duanne Guess on 07/01/2022 12:15:02

## 2022-07-02 NOTE — Progress Notes (Signed)
BRONSEN, SERANO ([id number]) [id number] Page 1 of 7 Visit Report for 07/01/2022 Arrival Information Details Patient Name: Date of Service: [name], [place] 07/01/2022 10:30 A M Medical Record Number: [id number] Patient Account Number: [id number] Date of Birth/Sex: Treating RN: 1970-06-25 (52 y.o. M) Primary Care Shawnetta Lein: [name] Other Clinician: Referring Andrienne Havener: Treating Aadan Chenier/Extender: [name] in Treatment: 9 Visit Information History Since Last Visit All ordered tests and consults were completed: No Patient Arrived: Wheel Chair Added or deleted any medications: No Arrival Time: 10:19 Any new allergies or adverse reactions: No Accompanied By: self Had a fall or experienced change in No Transfer Assistance: None activities of daily living that may affect Patient Identification Verified: Yes risk of falls: Secondary Verification Process Completed: Yes Signs or symptoms of abuse/neglect since last visito No Patient Requires Transmission-Based Precautions: No Hospitalized since last visit: No Patient Has Alerts: Yes Implantable device outside of the clinic excluding No Patient Alerts: R ABI= 1.16, [name] ABI= 1.09 cellular tissue based products placed in the center since last visit: Has Dressing in Place as Prescribed: Yes Pain Present Now: No Electronic Signature(s) Signed: 07/01/2022 4:43:48 PM By: Zenaida Deed RN, BSN Entered By: Zenaida Deed on 07/01/2022 10:33:54 -------------------------------------------------------------------------------- Clinic Level of Care Assessment Details Patient Name: Date of Service: [name], [name] [name]. 07/01/2022 10:30 A M Medical Record Number: [id number] Patient Account Number: [id number] Date of Birth/Sex: Treating RN: August 13, 1970 (52 y.o. [name] Primary Care Daija Routson: [name] Other Clinician: Referring Donovyn Guidice: Treating Kelden Lavallee/Extender: [name] in Treatment: 9 Clinic Level of Care Assessment Items TOOL 4 Quantity Score []  - 0 Use when only an EandM [name] performed on FOLLOW-UP visit ASSESSMENTS - Nursing Assessment / Reassessment X- 1 10 Reassessment of Co-morbidities (includes updates in patient status) X- 1 5 Reassessment of Adherence to Treatment Plan ASSESSMENTS - Wound and Skin A ssessment / Reassessment X - Simple Wound Assessment / Reassessment - one wound 1 5 []  - 0 Complex Wound Assessment / Reassessment - multiple wounds []  - 0 Dermatologic / Skin Assessment (not related to wound area) ASSESSMENTS - Focused Assessment []  - 0 Circumferential Edema Measurements - multi extremities []  - 0 Nutritional Assessment / Counseling / Intervention X- 1 5 Lower Extremity Assessment (monofilament, tuning fork, pulses) []  - 0 Peripheral Arterial Disease Assessment (using hand held doppler) ASSESSMENTS - Ostomy and/or Continence Assessment and Care []  - 0 Incontinence Assessment and Management []  - 0 Ostomy Care Assessment and Management (repouching, etc.) PROCESS - Coordination of Care [name], [name] ([id number]) [phone].pdf Page 2 of 7 X- 1 15 Simple Patient / Family Education for ongoing care []  - 0 Complex (extensive) Patient / Family Education for ongoing care X- 1 10 Staff obtains Chiropractor, Records, T Results / Process Orders est []  - 0 Staff telephones HHA, Nursing Homes / Clarify orders / etc []  - 0 Routine Transfer to another Facility (non-emergent condition) []  - 0 Routine Hospital Admission (non-emergent condition) []  - 0 New Admissions / Manufacturing engineer / Ordering NPWT Apligraf, etc. , []  - 0 Emergency Hospital Admission (emergent condition) X- 1 10 Simple Discharge Coordination []  - 0 Complex (extensive) Discharge Coordination PROCESS - Special Needs []  - 0 Pediatric / Minor Patient Management []  - 0 Isolation Patient Management []  - 0 Hearing /  Language / Visual special needs []  - 0 Assessment of Community assistance (transportation, D/C planning, etc.) []  - 0 Additional assistance / Altered mentation []  -  0 Support Surface(s) Assessment (bed, cushion, seat, etc.) INTERVENTIONS - Wound Cleansing / Measurement X - Simple Wound Cleansing - one wound 1 5 []  - 0 Complex Wound Cleansing - multiple wounds X- 1 5 Wound Imaging (photographs - any number of wounds) []  - 0 Wound Tracing (instead of photographs) []  - 0 Simple Wound Measurement - one wound []  - 0 Complex Wound Measurement - multiple wounds INTERVENTIONS - Wound Dressings []  - 0 Small Wound Dressing one or multiple wounds []  - 0 Medium Wound Dressing one or multiple wounds []  - 0 Large Wound Dressing one or multiple wounds []  - 0 Application of Medications - topical []  - 0 Application of Medications - injection INTERVENTIONS - Miscellaneous []  - 0 External ear exam []  - 0 Specimen Collection (cultures, biopsies, blood, body fluids, etc.) []  - 0 Specimen(s) / Culture(s) sent or taken to Lab for analysis []  - 0 Patient Transfer (multiple staff / Nurse, adult / Similar devices) []  - 0 Simple Staple / Suture removal (25 or less) []  - 0 Complex Staple / Suture removal (26 or more) []  - 0 Hypo / Hyperglycemic Management (close monitor of Blood Glucose) []  - 0 Ankle / Brachial Index (ABI) - do not check if billed separately X- 1 5 Vital Signs Has the patient been seen at the hospital within the last three years: Yes Total Score: 75 Level Of Care: New/Established - Level 2 Electronic Signature(s) Signed: 07/01/2022 4:43:48 PM By: Zenaida Deed RN, BSN [name], [name] ([id number]) [phone].pdf Page 3 of 7 Entered By: Zenaida Deed on 07/01/2022 10:48:33 -------------------------------------------------------------------------------- Encounter Discharge Information Details Patient Name: Date of Service: [name], [place]  07/01/2022 10:30 A M Medical Record Number: [id number] Patient Account Number: [id number] Date of Birth/Sex: Treating RN: 16-Jan-1971 (52 y.o. [name] Primary Care Akacia Boltz: [name] Other Clinician: Referring Macguire Holsinger: Treating Jaylise Peek/Extender: [name] in Treatment: 9 Encounter Discharge Information Items Discharge Condition: Stable Ambulatory Status: Wheelchair Discharge Destination: Home Transportation: Private Auto Accompanied By: self Schedule Follow-up Appointment: Yes Clinical Summary of Care: Patient Declined Electronic Signature(s) Signed: 07/01/2022 4:43:48 PM By: Zenaida Deed RN, BSN Entered By: Zenaida Deed on 07/01/2022 10:49:06 -------------------------------------------------------------------------------- Lower Extremity Assessment Details Patient Name: Date of Service: [name], [name] [name]. 07/01/2022 10:30 A M Medical Record Number: [id number] Patient Account Number: [id number] Date of Birth/Sex: Treating RN: March 20, 1970 (52 y.o. [name] Primary Care Teigen Parslow: [name] Other Clinician: Referring Shanesha Bednarz: Treating Racer Quam/Extender: [name] in Treatment: 9 Edema Assessment Assessed: [name]: No] [Right: No] Edema: [Left: Ye] [Right: s] Calf Left: Right: Point of Measurement: From Medial Instep 38.5 cm Ankle Left: Right: Point of Measurement: From Medial Instep 33 cm Vascular Assessment Pulses: Dorsalis Pedis Palpable: [Right:Yes] Electronic Signature(s) Signed: 07/01/2022 4:43:48 PM By: Zenaida Deed RN, BSN Entered By: Zenaida Deed on 07/01/2022 10:34:12 -------------------------------------------------------------------------------- Multi Wound Chart Details Patient Name: Date of Service: [name], [name] [name]. 07/01/2022 10:30 A M Medical Record Number: [id number] Patient Account Number: [id number] Date of Birth/Sex: Treating RN: [birth year] (52 y.o. M) Primary Care  Oseas Detty: [name] Other Clinician: Referring Chavis Tessler: Treating Nysa Sarin/Extender: Keilan, Nichol ([id number]) [id number] Page 4 of 7 Weeks in Treatment: 9 Vital Signs Height(in): 78 Pulse(bpm): 79 Weight(lbs): 336 Blood Pressure(mmHg): 119/75 Body Mass Index(BMI): 38.8 Temperature(F): 97.7 Respiratory Rate(breaths/min): 20 [6:Photos:] [N/A:N/A] Right, Lateral Foot N/A N/A Wound Location: Trauma N/A N/A Wounding Event: Neuropathic Ulcer-Non Diabetic N/A N/A Primary  Etiology: Anemia, Osteomyelitis, Paraplegia N/A N/A Comorbid History: 11/15/2021 N/A N/A Date Acquired: 9 N/A N/A Weeks of Treatment: Open N/A N/A Wound Status: No N/A N/A Wound Recurrence: 0x0x0 N/A N/A Measurements [name] x W x D (cm) 0 N/A N/A A (cm) : rea 0 N/A N/A Volume (cm) : 100.00% N/A N/A % Reduction in A rea: 100.00% N/A N/A % Reduction in Volume: Full Thickness Without Exposed N/A N/A Classification: Support Structures None Present N/A N/A Exudate Amount: None Present (0%) N/A N/A Granulation Amount: None Present (0%) N/A N/A Necrotic Amount: Fascia: No N/A N/A Exposed Structures: Fat Layer (Subcutaneous Tissue): No Tendon: No Muscle: No Joint: No Bone: No Large (67-100%) N/A N/A Epithelialization: No Abnormalities Noted N/A N/A Periwound Skin Texture: No Abnormalities Noted N/A N/A Periwound Skin Moisture: No Abnormalities Noted N/A N/A Periwound Skin Color: No Abnormality N/A N/A Temperature: Treatment Notes Electronic Signature(s) Signed: 07/01/2022 12:07:47 PM By: Duanne Guess MD FACS Entered By: Duanne Guess on 07/01/2022 12:07:46 -------------------------------------------------------------------------------- Multi-Disciplinary Care Plan Details Patient Name: Date of Service: [name], [name] [name]. 07/01/2022 10:30 A M Medical Record Number: [id number] Patient Account Number: [id number] Date of Birth/Sex:  Treating RN: [birth year] (52 y.o. [name] Primary Care [name]: [name] Other Clinician: Referring Matrice Herro: Treating Daven Montz/Extender: [name] in Treatment: 9 Multidisciplinary Care Plan reviewed with physician Active Inactive Electronic Signature(s) Signed: 07/01/2022 4:43:48 PM By: Zenaida Deed RN, BSN [name], [name] 07/01/2022 4:43:48 PM By: Zenaida Deed RN, BSN Signed: ([id number]) [id number] Page 5 of 7 Entered By: Zenaida Deed on 07/01/2022 10:36:22 -------------------------------------------------------------------------------- Pain Assessment Details Patient Name: Date of Service: [name], [name] [name]. 07/01/2022 10:30 A M Medical Record Number: [id number] Patient Account Number: [id number] Date of Birth/Sex: Treating RN: Jun 20, 1970 (52 y.o. M) Primary Care Katriona Schmierer: [name] Other Clinician: Referring Raechelle Sarti: Treating [name]/Extender: [name] in Treatment: 9 Active Problems Location of Pain Severity and Description of Pain Patient Has Paino No Site Locations Pain Management and Medication Current Pain Management: Electronic Signature(s) Signed: 07/01/2022 4:43:48 PM By: Zenaida Deed RN, BSN Entered By: Zenaida Deed on 07/01/2022 10:34:04 -------------------------------------------------------------------------------- Patient/Caregiver Education Details Patient Name: Date of Service: [name], [name] Aldine Contes 4/30/2024andnbsp10:30 A M Medical Record Number: [id number] Patient Account Number: [id number] Date of Birth/Gender: Treating RN: 30-Mar-1970 (52 y.o. [name] Primary Care Physician: [name] Other Clinician: Referring Physician: Treating Physician/Extender: [name] in Treatment: 9 Education Assessment Education Provided To: Patient Education Topics Provided Wound/Skin Impairment: Methods: Explain/Verbal Responses:  Reinforcements needed, State content correctly [employer]) Signed: 07/01/2022 4:43:48 PM By: Zenaida Deed RN, BSN Entered By: Zenaida Deed on 07/01/2022 10:36:41 [name], [name] ([id number]) [id number] Page 6 of 7 -------------------------------------------------------------------------------- Wound Assessment Details Patient Name: Date of Service: [name], [name] [name]. 07/01/2022 10:30 A M Medical Record Number: [id number] Patient Account Number: [id number] Date of Birth/Sex: Treating RN: 1971-01-03 (52 y.o. [name] Primary Care Wiatt Mahabir: [name] Other Clinician: Referring Jeven Topper: Treating Nassim Cosma/Extender: [name] in Treatment: 9 Wound Status Wound Number: 6 Primary Etiology: Neuropathic Ulcer-Non Diabetic Wound Location: Right, Lateral Foot Wound Status: Open Wounding Event: Trauma Comorbid History: Anemia, Osteomyelitis, Paraplegia Date Acquired: 11/15/2021 Weeks Of Treatment: 9 Clustered Wound: No Photos Wound Measurements Length: (cm) Width: (cm) Depth: (cm) Area: (cm) Volume: (cm) 0 % Reduction in Area: 100% 0 % Reduction in Volume: 100% 0 Epithelialization: Large (67-100%) 0 Tunneling: No 0 Undermining: No Wound  Description Classification: Full Thickness Without Exposed Support Structures Exudate Amount: None Present Foul Odor After Cleansing: No Slough/Fibrino No Wound Bed Granulation Amount: None Present (0%) Exposed Structure Necrotic Amount: None Present (0%) Fascia Exposed: No Fat Layer (Subcutaneous Tissue) Exposed: No Tendon Exposed: No Muscle Exposed: No Joint Exposed: No Bone Exposed: No Periwound Skin Texture Texture Color No Abnormalities Noted: Yes No Abnormalities Noted: Yes Moisture Temperature / Pain No Abnormalities Noted: Yes Temperature: No Abnormality Electronic Signature(s) Signed: 07/01/2022 4:43:48 PM By: Zenaida Deed RN, BSN Entered By:  Zenaida Deed on 07/01/2022 10:38:55 -------------------------------------------------------------------------------- Vitals Details Patient Name: Date of Service: [name], [name] [name]. 07/01/2022 10:30 A M Medical Record Number: [id number] Patient Account Number: [id number] [name], [name] ([id number]) [id number] Page 7 of 7 Date of Birth/Sex: Treating RN: 01/24/1971 (52 y.o. M) Primary Care Opaline Reyburn: Other Clinician: [name] Referring Noam Karaffa: Treating Tagen Milby/Extender: [name] in Treatment: 9 Vital Signs Time Taken: 10:19 Temperature (F): 97.7 Height (in): 78 Pulse (bpm): 79 Weight (lbs): 336 Respiratory Rate (breaths/min): 20 Body Mass Index (BMI): 38.8 Blood Pressure (mmHg): 119/75 Reference Range: 80 - 120 mg / dl Electronic Signature(s) Signed: 07/01/2022 4:43:48 PM By: Zenaida Deed RN, BSN Entered By: Zenaida Deed on 07/01/2022 10:33:59

## 2022-11-17 ENCOUNTER — Encounter (HOSPITAL_BASED_OUTPATIENT_CLINIC_OR_DEPARTMENT_OTHER): Payer: 59 | Attending: General Surgery | Admitting: General Surgery

## 2022-11-17 DIAGNOSIS — E039 Hypothyroidism, unspecified: Secondary | ICD-10-CM | POA: Diagnosis not present

## 2022-11-17 DIAGNOSIS — L97512 Non-pressure chronic ulcer of other part of 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: Secondary | ICD-10-CM | POA: Diagnosis not present

## 2022-11-17 DIAGNOSIS — L97412 Non-pressure chronic ulcer of right heel and midfoot with fat layer exposed: Secondary | ICD-10-CM | POA: Diagnosis present

## 2022-11-17 DIAGNOSIS — L98492 Non-pressure chronic ulcer of skin of other sites with fat layer exposed: Secondary | ICD-10-CM | POA: Diagnosis not present

## 2022-11-17 DIAGNOSIS — G8220 Paraplegia, unspecified: Secondary | ICD-10-CM | POA: Diagnosis not present

## 2022-11-17 DIAGNOSIS — I10 Essential (primary) hypertension: Secondary | ICD-10-CM | POA: Diagnosis not present

## 2022-11-17 NOTE — Progress Notes (Signed)
[name], [name] ([id number]) [phone] [id number] Page 1 of 4 Visit Report for 11/17/2022 Abuse Risk Screen Details Patient Name: Date of Service: [name], [name] [name] 11/17/2022 12:45 PM Medical Record Number: [id number] Patient Account Number: [id number] Date of Birth/Sex: Treating RN: 1970-06-19 (52 y.o. [name] Primary Care [name]: [name] Other Clinician: Referring [name]: Treating [name]/Extender: [name] in Treatment: 0 Abuse Risk Screen Items Answer ABUSE RISK SCREEN: Has anyone close to you tried to hurt or harm you recentlyo No Do you feel uncomfortable with anyone in your familyo No Has anyone forced you do things that you didnt want to doo No Electronic Signature(s) Signed: 11/17/2022 4:26:03 PM By: [name] Entered By: [name] on 11/17/2022 09:45:01 -------------------------------------------------------------------------------- Activities of Daily Living Details Patient Name: Date of Service: [name], [name] [name] 11/17/2022 12:45 PM Medical Record Number: [id number] Patient Account Number: [id number] Date of Birth/Sex: Treating RN: 1970/07/13 (52 y.o. [name] Primary Care [name]: [name] Other Clinician: Referring [name]: Treating [name]/Extender: [name] in Treatment: 0 Activities of Daily Living Items Answer Activities of Daily Living (Please select one for each item) Drive Automobile Completely Able T Medications ake Completely Able Use T elephone Completely Able Care for Appearance Completely Able Use T oilet Completely Able Bath / Shower Completely Able Dress Self Completely Able Feed Self Completely Able Walk Not Able Get In / Out Bed Completely Able Housework Completely Able Prepare Meals Completely Able Handle Money Completely Able Shop for Self Completely Able Electronic Signature(s) Signed: 11/17/2022 4:26:03 PM  By: [name] Entered By: [name] on 11/17/2022 09:45:27 [name] ([id number]) [id number] Page 2 of 4 -------------------------------------------------------------------------------- Education Screening Details Patient Name: Date of Service: [name], [name] [name] 11/17/2022 12:45 PM Medical Record Number: [id number] Patient Account Number: [id number] Date of Birth/Sex: Treating RN: [birth year] (52 y.o. [name] Primary Care [name]: [name] Other Clinician: Referring [name]: Treating [name]/Extender: [name] in Treatment: 0 Primary Learner Assessed: Patient Learning Preferences/Education Level/Primary Language Learning Preference: Explanation, Demonstration, Video, Printed Material Highest Education Level: High School Preferred Language: English Cognitive Barrier Language Barrier: No Translator Needed: No Memory Deficit: No Emotional Barrier: No Cultural/Religious Beliefs Affecting Medical Care: No Physical Barrier Impaired Vision: No Impaired Hearing: No Decreased Hand dexterity: No Knowledge/Comprehension Knowledge Level: Medium Comprehension Level: Medium Ability to understand written instructions: Medium Ability to understand verbal instructions: Medium Motivation Anxiety Level: Calm Cooperation: Cooperative Education Importance: Acknowledges Need Interest in Health Problems: Asks Questions Perception: Coherent Willingness to Engage in Self-Management Medium Activities: Readiness to Engage in Self-Management Medium Activities: Electronic Signature(s) Signed: 11/17/2022 4:26:03 PM By: [name] Entered By: [name] on 11/17/2022 09:45:51 -------------------------------------------------------------------------------- Fall Risk Assessment Details Patient Name: Date of Service: [name], [name] [name] 11/17/2022 12:45 PM Medical Record Number:  [id number] Patient Account Number: [id number] Date of Birth/Sex: Treating RN: 21-Jan-1971 (52 y.o. [name] Primary Care [name]: [name] Other Clinician: Referring [name]: Treating [name]/Extender: [name] in Treatment: 0 Fall Risk Assessment Items Have you had 2 or more falls in the last 12 monthso 0 No [name], [name] L ([id number]) [id number] Page 3 of 4 Have you had any fall that resulted in injury in the last 12 monthso 0 No FALLS RISK SCREEN History of falling - immediate or within 3 months 0 No Secondary diagnosis (Do you have 2 or more medical diagnoseso) 0 No  Ambulatory aid None/bed rest/wheelchair/nurse 0 Yes Crutches/cane/walker 0 No Furniture 0 No Intravenous therapy Access/Saline/Heparin Lock 0 No Gait/Transferring Normal/ bed rest/ wheelchair 0 Yes Weak (short steps with or without shuffle, stooped but able to lift head while walking, may seek 0 No support from furniture) Impaired (short steps with shuffle, may have difficulty arising from chair, head down, impaired 0 No balance) Mental Status Oriented to own ability 0 Yes Electronic Signature(s) Signed: 11/17/2022 4:26:03 PM By: [name] Entered By: [name] on 11/17/2022 09:46:27 -------------------------------------------------------------------------------- Foot Assessment Details Patient Name: Date of Service: [name], [name] [name] 11/17/2022 12:45 PM Medical Record Number: [id number] Patient Account Number: [id number] Date of Birth/Sex: Treating RN: [birth year] (52 y.o. [name] Primary Care [name]: [name] Other Clinician: Referring [name]: Treating [name] in Treatment: 0 Foot Assessment Items Site Locations + = Sensation present, - = Sensation absent, C = Callus, U = Ulcer R = Redness, W = Warmth, M = Maceration, PU = Pre-ulcerative lesion F =  Fissure, S = Swelling, D = Dryness Assessment Right: Left: Other Deformity: No No Prior Foot Ulcer: No No Prior Amputation: No No Charcot Joint: No No Ambulatory Status: Non-ambulatory Assistance Device: Wheelchair [name], [name] ([id number]) [phone] [id number] Page 4 of 4 Gait: Notes paraplegia Electronic Signature(s) Signed: 11/17/2022 4:26:03 PM By: [name] Entered By: [name] on 11/17/2022 09:46:53 -------------------------------------------------------------------------------- Nutrition Risk Screening Details Patient Name: Date of Service: [name], [name] [name] 11/17/2022 12:45 PM Medical Record Number: [id number] Patient Account Number: [id number] Date of Birth/Sex: Treating RN: 1970/05/07 (52 y.o. [name] Primary Care Achilles Neville: [name] Other Clinician: Referring Brynnan Rodenbaugh: Treating Ruble Buttler/Extender: [name] in Treatment: 0 Height (in): 78 Weight (lbs): 336 Body Mass Index (BMI): 38.8 Nutrition Risk Screening Items Score Screening NUTRITION RISK SCREEN: I have an illness or condition that made me change the kind and/or amount of food I eat 0 No I eat fewer than two meals per day 0 No I eat few fruits and vegetables, or milk products 0 No I have three or more drinks of beer, liquor or wine almost every day 0 No I have tooth or mouth problems that make it hard for me to eat 0 No I don't always have enough money to buy the food I need 0 No I eat alone most of the time 0 No I take three or more different prescribed or over-the-counter drugs a day 1 Yes Without wanting to, I have lost or gained 10 pounds in the last six months 0 No I am not always physically able to shop, cook and/or feed myself 0 No Nutrition Protocols Good Risk Protocol 0 No interventions needed Moderate Risk Protocol High Risk Proctocol Risk Level: Good Risk Score: 1 Electronic Signature(s) Signed: 11/17/2022  4:26:03 PM By: [name] Entered By: [name] on 11/17/2022 09:46:38

## 2022-11-17 NOTE — Progress Notes (Signed)
FACS Entered By: Duanne Guess on 11/17/2022 10:18:26 -------------------------------------------------------------------------------- Multi-Disciplinary Care Plan Details Patient Name: Date of Service: [name], [place] 11/17/2022 12:45 PM Medical Record Number: [id number] Patient Account Number: [id number] Date of Birth/Sex: Treating RN: 04-Nov-1970 (51 y.o. [name]: Loura [name] Other Clinician: Referring Jun Rightmyer: Treating Ustin Cruickshank/Extender: [name] in Treatment: 0 Bodenheimer, [name] Major ([id number]) [id number] Page 5 of 8 Active Inactive Necrotic Tissue Nursing Diagnoses: Impaired tissue integrity related to necrotic/devitalized tissue Knowledge deficit related to management of necrotic/devitalized tissue Goals: Necrotic/devitalized tissue will be minimized in the wound bed Date Initiated: 11/17/2022 Target Resolution Date: 01/09/2023 Goal  Status: Active Patient/caregiver will verbalize understanding of reason and process for debridement of necrotic tissue Date Initiated: 11/17/2022 Target Resolution Date: 01/09/2023 Goal Status: Active Interventions: Assess patient pain level pre-, during and post procedure and prior to discharge Provide education on necrotic tissue and debridement process Treatment Activities: Apply topical anesthetic as ordered : 11/17/2022 Notes: Wound/Skin Impairment Nursing Diagnoses: Impaired tissue integrity Knowledge deficit related to ulceration/compromised skin integrity Goals: Patient/caregiver will verbalize understanding of skin care regimen Date Initiated: 11/17/2022 Target Resolution Date: 01/09/2023 Goal Status: Active Ulcer/skin breakdown will heal within 14 weeks Date Initiated: 11/17/2022 Target Resolution Date: 01/09/2023 Goal Status: Active Interventions: Assess patient/caregiver ability to obtain necessary supplies Assess patient/caregiver ability to perform ulcer/skin care regimen upon admission and as needed Assess ulceration(s) every visit Treatment Activities: Referred to DME Wylene Weissman for dressing supplies : 11/17/2022 Skin care regimen initiated : 11/17/2022 Topical wound management initiated : 11/17/2022 Notes: Electronic Signature(s) Signed: 11/17/2022 4:26:03 PM By: Samuella Bruin Entered By: Samuella Bruin on 11/17/2022 10:18:13 -------------------------------------------------------------------------------- Pain Assessment Details Patient Name: Date of Service: [name], [name] [name] 11/17/2022 12:45 PM Medical Record Number: [id number] Patient Account Number: [id number] Date of Birth/Sex: Treating RN: March 09, 1970 (52 y.o. [name]: Treating Niquan Charnley/Extender: [name] in Treatment: 0 Active Problems Tremain, [name] Major ([id number])  [id number] Page 6 of 8 Location of Pain Severity and Description of Pain Patient Has Paino No Site Locations Rate the pain. Current Pain Level: 0 Pain Management and Medication Current Pain Management: Electronic Signature(s) Signed: 11/17/2022 4:26:03 PM By: Samuella Bruin Entered By: Samuella Bruin on 11/17/2022 09:53:36 -------------------------------------------------------------------------------- Patient/Caregiver Education Details Patient Name: Date of Service: [name] Delman [name], [name] Aldine Contes 9/16/2024andnbsp12:45 PM Medical Record Number: [id number] Patient Account Number: [id number] Date of Birth/Gender: Treating RN: 1970/06/13 (52 y.o. [name] Primary Care Physician: Loura [name] Other Clinician: Referring Physician: Treating Physician/Extender: [name] in Treatment: 0 Education Assessment Education Provided To: Patient Education Topics Provided Wound/Skin Impairment: Methods: Explain/Verbal Responses: Reinforcements needed, State content correctly Electronic Signature(s) Signed: 11/17/2022 4:26:03 PM By: Samuella Bruin Entered By: Samuella Bruin on 11/17/2022 10:18:29 -------------------------------------------------------------------------------- Wound Assessment Details Patient Name: Date of Service: [name], [name] [name] 11/17/2022 12:45 PM Knotts, [name] Major ([id number]) [id number] Page 7 of 8 Medical Record Number: [id number] Patient Account Number: [id number] Date of Birth/Sex: Treating RN: 10/26/1970 (52 y.o. [name] Primary Care Selma Mink: Loura [name] Other Clinician: Referring Jessikah Dicker: Treating Tippi Mccrae/Extender: [name] in Treatment: 0 Wound Status Wound Number: 7 Primary Etiology: Trauma, Other Wound Location: Left [name] Wound Status: Open Wounding Event: Trauma Comorbid History: Anemia, Osteomyelitis, Paraplegia Date  Acquired: 09/16/2022 Weeks Of Treatment: 0 Clustered Wound: No Photos Wound Measurements Length: (cm) 2.1 Width: (cm) 5.5 Depth: (cm) 1  Area: (cm) 9.071 Volume: (cm) 9.071 % Reduction in Area: % Reduction in Volume: Epithelialization: Small (1-33%) Tunneling: No Undermining: Yes Starting Position (o'clock): 6 Ending Position (o'clock): 9 Maximum Distance: (cm) 2 Wound Description Classification: Full Thickness Without Exposed Support Structures Wound Margin: Distinct, outline attached Exudate Amount: Medium Exudate Type: Serosanguineous Exudate Color: red, brown Foul Odor After Cleansing: No Slough/Fibrino Yes Wound Bed Granulation Amount: Large (67-100%) Exposed Structure Granulation Quality: Red Fascia Exposed: No Necrotic Amount: Small (1-33%) Fat Layer (Subcutaneous Tissue) Exposed: Yes Necrotic Quality: Adherent Slough Tendon Exposed: No Muscle Exposed: No Joint Exposed: No Bone Exposed: No Periwound Skin Texture Texture Color No Abnormalities Noted: Yes No Abnormalities Noted: Yes Moisture Temperature / Pain No Abnormalities Noted: Yes Temperature: No Abnormality Treatment Notes Wound #7 ([name]) Wound Laterality: Left Cleanser Soap and Water Discharge Instruction: May shower and wash wound with dial antibacterial soap and water prior to dressing change. Wound Cleanser Discharge Instruction: Cleanse the wound with wound cleanser prior to applying a clean dressing using gauze sponges, not tissue or cotton balls. Peri-Wound Care KENYETTA, GROSECLOSE ([id number]) [id number] Page 8 of 8 Topical Primary Dressing Maxorb Extra Ag+ Alginate Dressing, 4x4.75 (in/in) Discharge Instruction: Apply to wound bed as instructed Secondary Dressing ABD Pad, 8x10 Discharge Instruction: Apply over primary dressing as directed. Woven Gauze Sponge, Non-Sterile 4x4 in Discharge Instruction: Apply over primary dressing as directed. Secured With 49M  Medipore H Soft Cloth Surgical T ape, 4 x 10 (in/yd) Discharge Instruction: Secure with tape as directed. Compression Wrap Compression Stockings Add-Ons Electronic Signature(s) Signed: 11/17/2022 4:26:03 PM By: Samuella Bruin Entered By: Samuella Bruin on 11/17/2022 10:00:26 -------------------------------------------------------------------------------- Vitals Details Patient Name: Date of Service: [name] Delman [name], [name] [name] 11/17/2022 12:45 PM Medical Record Number: [id number] Patient Account Number: [id number] Date of Birth/Sex: Treating RN: 1970/07/03 (52 y.o. [name] Primary Care Bayla Mcgovern: Loura [name] Other Clinician: Referring Johnisha Louks: Treating Yiannis Tulloch/Extender: [name] in Treatment: 0 Vital Signs Time Taken: 12:50 Temperature (F): 98 Height (in): 78 Pulse (bpm): 74 Source: Stated Respiratory Rate (breaths/min): 16 Weight (lbs): 347 Blood Pressure (mmHg): 151/85 Source: Stated Reference Range: 80 - 120 mg / dl Body Mass Index (BMI): 40.1 Electronic Signature(s) Signed: 11/17/2022 4:26:03 PM By: Samuella Bruin Entered By: Samuella Bruin on 11/17/2022 09:51:59  FACS Entered By: Duanne Guess on 11/17/2022 10:18:26 -------------------------------------------------------------------------------- Multi-Disciplinary Care Plan Details Patient Name: Date of Service: [name], [place] 11/17/2022 12:45 PM Medical Record Number: [id number] Patient Account Number: [id number] Date of Birth/Sex: Treating RN: 04-Nov-1970 (51 y.o. [name]: Loura [name] Other Clinician: Referring Jun Rightmyer: Treating Ustin Cruickshank/Extender: [name] in Treatment: 0 Bodenheimer, [name] Major ([id number]) [id number] Page 5 of 8 Active Inactive Necrotic Tissue Nursing Diagnoses: Impaired tissue integrity related to necrotic/devitalized tissue Knowledge deficit related to management of necrotic/devitalized tissue Goals: Necrotic/devitalized tissue will be minimized in the wound bed Date Initiated: 11/17/2022 Target Resolution Date: 01/09/2023 Goal  Status: Active Patient/caregiver will verbalize understanding of reason and process for debridement of necrotic tissue Date Initiated: 11/17/2022 Target Resolution Date: 01/09/2023 Goal Status: Active Interventions: Assess patient pain level pre-, during and post procedure and prior to discharge Provide education on necrotic tissue and debridement process Treatment Activities: Apply topical anesthetic as ordered : 11/17/2022 Notes: Wound/Skin Impairment Nursing Diagnoses: Impaired tissue integrity Knowledge deficit related to ulceration/compromised skin integrity Goals: Patient/caregiver will verbalize understanding of skin care regimen Date Initiated: 11/17/2022 Target Resolution Date: 01/09/2023 Goal Status: Active Ulcer/skin breakdown will heal within 14 weeks Date Initiated: 11/17/2022 Target Resolution Date: 01/09/2023 Goal Status: Active Interventions: Assess patient/caregiver ability to obtain necessary supplies Assess patient/caregiver ability to perform ulcer/skin care regimen upon admission and as needed Assess ulceration(s) every visit Treatment Activities: Referred to DME Wylene Weissman for dressing supplies : 11/17/2022 Skin care regimen initiated : 11/17/2022 Topical wound management initiated : 11/17/2022 Notes: Electronic Signature(s) Signed: 11/17/2022 4:26:03 PM By: Samuella Bruin Entered By: Samuella Bruin on 11/17/2022 10:18:13 -------------------------------------------------------------------------------- Pain Assessment Details Patient Name: Date of Service: [name], [name] [name] 11/17/2022 12:45 PM Medical Record Number: [id number] Patient Account Number: [id number] Date of Birth/Sex: Treating RN: March 09, 1970 (52 y.o. [name]: Treating Niquan Charnley/Extender: [name] in Treatment: 0 Active Problems Tremain, [name] Major ([id number])  [id number] Page 6 of 8 Location of Pain Severity and Description of Pain Patient Has Paino No Site Locations Rate the pain. Current Pain Level: 0 Pain Management and Medication Current Pain Management: Electronic Signature(s) Signed: 11/17/2022 4:26:03 PM By: Samuella Bruin Entered By: Samuella Bruin on 11/17/2022 09:53:36 -------------------------------------------------------------------------------- Patient/Caregiver Education Details Patient Name: Date of Service: [name] Delman [name], [name] Aldine Contes 9/16/2024andnbsp12:45 PM Medical Record Number: [id number] Patient Account Number: [id number] Date of Birth/Gender: Treating RN: 1970/06/13 (52 y.o. [name] Primary Care Physician: Loura [name] Other Clinician: Referring Physician: Treating Physician/Extender: [name] in Treatment: 0 Education Assessment Education Provided To: Patient Education Topics Provided Wound/Skin Impairment: Methods: Explain/Verbal Responses: Reinforcements needed, State content correctly Electronic Signature(s) Signed: 11/17/2022 4:26:03 PM By: Samuella Bruin Entered By: Samuella Bruin on 11/17/2022 10:18:29 -------------------------------------------------------------------------------- Wound Assessment Details Patient Name: Date of Service: [name], [name] [name] 11/17/2022 12:45 PM Knotts, [name] Major ([id number]) [id number] Page 7 of 8 Medical Record Number: [id number] Patient Account Number: [id number] Date of Birth/Sex: Treating RN: 10/26/1970 (52 y.o. [name] Primary Care Selma Mink: Loura [name] Other Clinician: Referring Jessikah Dicker: Treating Tippi Mccrae/Extender: [name] in Treatment: 0 Wound Status Wound Number: 7 Primary Etiology: Trauma, Other Wound Location: Left [name] Wound Status: Open Wounding Event: Trauma Comorbid History: Anemia, Osteomyelitis, Paraplegia Date  Acquired: 09/16/2022 Weeks Of Treatment: 0 Clustered Wound: No Photos Wound Measurements Length: (cm) 2.1 Width: (cm) 5.5 Depth: (cm) 1  WATT, KRAATZ ([id number]) [phone].pdf Page 1 of 8 Visit Report for 11/17/2022 Allergy List Details Patient Name: Date of Service: [name], [place] 11/17/2022 12:45 PM Medical Record Number: [id number] Patient Account Number: [id number] Date of Birth/Sex: Treating RN: 1970/04/12 (52 y.o. [name] Primary Care Emmit Oriley: Loura [name] Other Clinician: Referring Carman Auxier: Treating Mishal Probert/Extender: [name] in Treatment: 0 Allergies Active Allergies levothyroxine Reaction: lethargy Allergy Notes Electronic Signature(s) Signed: 11/17/2022 4:26:03 PM By: Samuella Bruin Entered By: Samuella Bruin on 11/17/2022 04:56:29 -------------------------------------------------------------------------------- Arrival Information Details Patient Name: Date of Service: [name] Delman [name], [name] [name] 11/17/2022 12:45 PM Medical Record Number: [id number] Patient Account Number: [id number] Date of Birth/Sex: Treating RN: 23-Oct-1970 (52 y.o. [name] Primary Care Laikynn Pollio: Loura [name] Other Clinician: Referring Rohail Klees: Treating Braylin Xu/Extender: [name] in Treatment: 0 Visit Information Patient Arrived: Wheel Chair Arrival Time: 12:44 Accompanied By: self Transfer Assistance: Manual Patient Identification Verified: Yes Secondary Verification Process Completed: Yes History Since Last Visit Electronic Signature(s) Signed: 11/17/2022 4:26:03 PM By: Samuella Bruin Entered By: Samuella Bruin on 11/17/2022 09:44:51 -------------------------------------------------------------------------------- Clinic Level of Care Assessment Details Patient Name: Date of Service: [name], [name] [name] 11/17/2022 12:45 PM Medical Record Number: [id number] Patient Account Number: [id number] Date of Birth/Sex: Treating RN: 1970-04-20 (52 y.o. Emron, Reckner, [name] Major ([id number])  [id number] Page 2 of 8 Primary Care Tanner Yeley: Loura [name] Other Clinician: Referring Kieanna Rollo: Treating Geovanie Winnett/Extender: [name] in Treatment: 0 Clinic Level of Care Assessment Items TOOL 1 Quantity Score X- 1 0 Use when EandM and Procedure [name] performed on INITIAL visit ASSESSMENTS - Nursing Assessment / Reassessment X- 1 20 General Physical Exam (combine w/ comprehensive assessment (listed just below) when performed on new pt. evals) X- 1 25 Comprehensive Assessment (HX, ROS, Risk Assessments, Wounds Hx, etc.) ASSESSMENTS - Wound and Skin Assessment / Reassessment []  - 0 Dermatologic / Skin Assessment (not related to wound area) ASSESSMENTS - Ostomy and/or Continence Assessment and Care []  - 0 Incontinence Assessment and Management []  - 0 Ostomy Care Assessment and Management (repouching, etc.) PROCESS - Coordination of Care X - Simple Patient / Family Education for ongoing care 1 15 []  - 0 Complex (extensive) Patient / Family Education for ongoing care X- 1 10 Staff obtains Chiropractor, Records, T Results / Process Orders est []  - 0 Staff telephones HHA, Nursing Homes / Clarify orders / etc []  - 0 Routine Transfer to another Facility (non-emergent condition) []  - 0 Routine Hospital Admission (non-emergent condition) X- 1 15 New Admissions / Manufacturing engineer / Ordering NPWT Apligraf, etc. , []  - 0 Emergency Hospital Admission (emergent condition) PROCESS - Special Needs []  - 0 Pediatric / Minor Patient Management []  - 0 Isolation Patient Management []  - 0 Hearing / Language / Visual special needs []  - 0 Assessment of Community assistance (transportation, D/C planning, etc.) []  - 0 Additional assistance / Altered mentation []  - 0 Support Surface(s) Assessment (bed, cushion, seat, etc.) INTERVENTIONS - Miscellaneous []  - 0 External ear exam []  - 0 Patient Transfer (multiple staff / Nurse, adult /  Similar devices) []  - 0 Simple Staple / Suture removal (25 or less) []  - 0 Complex Staple / Suture removal (26 or more) []  - 0 Hypo/Hyperglycemic Management (do not check if billed separately) []  - 0 Ankle / Brachial Index (ABI) - do not check if billed separately Has the patient been seen at the hospital within the last
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
## 2022-11-25 ENCOUNTER — Ambulatory Visit (HOSPITAL_BASED_OUTPATIENT_CLINIC_OR_DEPARTMENT_OTHER): Payer: 59 | Admitting: General Surgery

## 2022-12-02 ENCOUNTER — Encounter (HOSPITAL_BASED_OUTPATIENT_CLINIC_OR_DEPARTMENT_OTHER): Payer: 59 | Admitting: General Surgery

## 2022-12-10 ENCOUNTER — Encounter (HOSPITAL_BASED_OUTPATIENT_CLINIC_OR_DEPARTMENT_OTHER): Payer: 59 | Attending: General Surgery | Admitting: General Surgery

## 2022-12-10 DIAGNOSIS — E039 Hypothyroidism, unspecified: Secondary | ICD-10-CM | POA: Diagnosis not present

## 2022-12-10 DIAGNOSIS — G8220 Paraplegia, unspecified: Secondary | ICD-10-CM | POA: Insufficient documentation

## 2022-12-10 DIAGNOSIS — L98492 Non-pressure chronic ulcer of skin of other sites with fat layer exposed: Secondary | ICD-10-CM | POA: Insufficient documentation

## 2022-12-10 NOTE — Progress Notes (Signed)
ADREYAN, CARBAJAL ([id number]) [phone].pdf Page 1 of 7 Visit Report for 12/10/2022 Arrival Information Details Patient Name: Date of Service: [name], [place] 12/10/2022 9:30 A M Medical Record Number: [id number] Patient Account Number: [id number] Date of Birth/Sex: Treating RN: 1970-04-23 (52 y.o. Cline Cools Primary Care Palmer Shorey: [name] Other Clinician: Referring Jinelle Butchko: Treating Jasier Calabretta/Extender: [name] in Treatment: 3 Visit Information History Since Last Visit Added or deleted any medications: No Patient Arrived: Wheel Chair Any new allergies or adverse reactions: No Arrival Time: 09:43 Had a fall or experienced change in No Accompanied By: self activities of daily living that may affect Transfer Assistance: None risk of falls: Patient Identification Verified: Yes Signs or symptoms of abuse/neglect since last visito No Secondary Verification Process Completed: Yes Hospitalized since last visit: No Implantable device outside of the clinic excluding No cellular tissue based products placed in the center since last visit: Has Dressing in Place as Prescribed: Yes Pain Present Now: No Electronic Signature(s) Signed: 12/10/2022 2:53:36 PM By: Redmond Pulling RN, BSN Entered By: Redmond Pulling on 12/10/2022 06:50:41 -------------------------------------------------------------------------------- Encounter Discharge Information Details Patient Name: Date of Service: [name], [name] [name]. 12/10/2022 9:30 A M Medical Record Number: [id number] Patient Account Number: [id number] Date of Birth/Sex: Treating RN: 01-Dec-1970 (52 y.o. Cline Cools Primary Care Khaleed Holan: [name] Other Clinician: Referring Yareli Carthen: Treating Mallary Kreger/Extender: [name] in Treatment: 3 Encounter Discharge Information Items Post Procedure Vitals Discharge Condition: Stable Temperature (F): 97.5 Ambulatory Status:  Wheelchair Pulse (bpm): 62 Discharge Destination: Home Respiratory Rate (breaths/min): 18 Transportation: Private Auto Blood Pressure (mmHg): 142/88 Accompanied By: self Schedule Follow-up Appointment: Yes Clinical Summary of Care: Patient Declined Electronic Signature(s) Signed: 12/10/2022 2:53:36 PM By: Redmond Pulling RN, BSN Entered By: Redmond Pulling on 12/10/2022 07:15:06 Bedore, [name] Major ([id number]) [id number] Page 2 of 7 -------------------------------------------------------------------------------- Lower Extremity Assessment Details Patient Name: Date of Service: [name], [name] [name]. 12/10/2022 9:30 A M Medical Record Number: [id number] Patient Account Number: [id number] Date of Birth/Sex: Treating RN: [birth year] (52 y.o. Cline Cools Primary Care Abelina Ketron: [name] Other Clinician: Referring Sheenah Dimitroff: Treating Lemuel Boodram/Extender: [name] in Treatment: 3 Electronic Signature(s) Signed: 12/10/2022 2:53:36 PM By: Redmond Pulling RN, BSN Entered By: Redmond Pulling on 12/10/2022 06:53:03 -------------------------------------------------------------------------------- Multi Wound Chart Details Patient Name: Date of Service: [name], [name] [name]. 12/10/2022 9:30 A M Medical Record Number: [id number] Patient Account Number: [id number] Date of Birth/Sex: Treating RN: 01-30-1971 (52 y.o. M) Primary Care Amon Costilla: [name] Other Clinician: Referring Munir Victorian: Treating Merrianne Mccumbers/Extender: [name] in Treatment: 3 Vital Signs Height(in): 78 Pulse(bpm): 62 Weight(lbs): 347 Blood Pressure(mmHg): 142/88 Body Mass Index(BMI): 40.1 Temperature(F): 97.5 Respiratory Rate(breaths/min): 18 [7:Photos:] [N/A:N/A] Left Back N/A N/A Wound Location: Trauma N/A N/A Wounding Event: Trauma, Other N/A N/A Primary Etiology: Anemia, Osteomyelitis, Paraplegia N/A N/A Comorbid History: 09/16/2022 N/A N/A Date  Acquired: 3 N/A N/A Weeks of Treatment: Open N/A N/A Wound Status: No N/A N/A Wound Recurrence: 2.2x0.3x0.1 N/A N/A Measurements [name] x W x D (cm) 0.518 N/A N/A A (cm) : rea 0.052 N/A N/A Volume (cm) : 94.30% N/A N/A % Reduction in A rea: 99.40% N/A N/A % Reduction in Volume: Full Thickness Without Exposed N/A N/A Classification: Support Structures Medium N/A N/A Exudate Amount: Serosanguineous N/A N/A Exudate Type: red, brown N/A N/A Exudate Color: Distinct, outline attached N/A N/A Wound Margin: Large (67-100%) N/A N/A Granulation Amount: Red N/A  N/A Granulation Quality: Small (1-33%) N/A N/A Necrotic Amount: [name], [name] ([id number]) [id number] Page 3 of 7 Fat Layer (Subcutaneous Tissue): Yes N/A N/A Exposed Structures: Fascia: No Tendon: No Muscle: No Joint: No Bone: No Large (67-100%) N/A N/A Epithelialization: Debridement - Selective/Open Wound N/A N/A Debridement: Pre-procedure Verification/Time Out 10:00 N/A N/A Taken: Lidocaine 4% Topical Solution N/A N/A Pain Control: Necrotic/Eschar, Slough N/A N/A Tissue Debrided: Non-Viable Tissue N/A N/A Level: 0.52 N/A N/A Debridement A (sq cm): rea Curette N/A N/A Instrument: Minimum N/A N/A Bleeding: Pressure N/A N/A Hemostasis A chieved: Procedure was tolerated well N/A N/A Debridement Treatment Response: 2.2x0.3x0.1 N/A N/A Post Debridement Measurements [name] x W x D (cm) 0.052 N/A N/A Post Debridement Volume: (cm) No Abnormalities Noted N/A N/A Periwound Skin Texture: No Abnormalities Noted N/A N/A Periwound Skin Moisture: No Abnormalities Noted N/A N/A Periwound Skin Color: No Abnormality N/A N/A Temperature: Debridement N/A N/A Procedures Performed: Treatment Notes Wound #7 (Back) Wound Laterality: Left Cleanser Soap and Water Discharge Instruction: May shower and wash wound with dial antibacterial soap and water prior to dressing change. Wound  Cleanser Discharge Instruction: Cleanse the wound with wound cleanser prior to applying a clean dressing using gauze sponges, not tissue or cotton balls. Peri-Wound Care Topical Primary Dressing Maxorb Extra Ag+ Alginate Dressing, 4x4.75 (in/in) Discharge Instruction: Apply to wound bed as instructed Secondary Dressing ABD Pad, 8x10 Discharge Instruction: Apply over primary dressing as directed. Woven Gauze Sponge, Non-Sterile 4x4 in Discharge Instruction: Apply over primary dressing as directed. Secured With 68M Medipore H Soft Cloth Surgical T ape, 4 x 10 (in/yd) Discharge Instruction: Secure with tape as directed. Compression Wrap Compression Stockings Add-Ons Electronic Signature(s) Signed: 12/10/2022 11:08:36 AM By: Duanne Guess MD FACS Entered By: Duanne Guess on 12/10/2022 08:08:36 -------------------------------------------------------------------------------- Multi-Disciplinary Care Plan Details Patient Name: Date of Service: [name], [name] [name]. 12/10/2022 9:30 A M Medical Record Number: [id number] Patient Account Number: [id number] [name], [name] ([id number]) [phone].pdf Page 4 of 7 Date of Birth/Sex: Treating RN: Feb 15, 1971 (52 y.o. Cline Cools Primary Care Arrington Bencomo: Other Clinician: [name] Referring Rosy Estabrook: Treating Leeah Politano/Extender: [name] in Treatment: 3 Active Inactive Necrotic Tissue Nursing Diagnoses: Impaired tissue integrity related to necrotic/devitalized tissue Knowledge deficit related to management of necrotic/devitalized tissue Goals: Necrotic/devitalized tissue will be minimized in the wound bed Date Initiated: 11/17/2022 Target Resolution Date: 01/09/2023 Goal Status: Active Patient/caregiver will verbalize understanding of reason and process for debridement of necrotic tissue Date Initiated: 11/17/2022 Target Resolution Date: 01/09/2023 Goal Status: Active Interventions: Assess  patient pain level pre-, during and post procedure and prior to discharge Provide education on necrotic tissue and debridement process Treatment Activities: Apply topical anesthetic as ordered : 11/17/2022 Notes: Wound/Skin Impairment Nursing Diagnoses: Impaired tissue integrity Knowledge deficit related to ulceration/compromised skin integrity Goals: Patient/caregiver will verbalize understanding of skin care regimen Date Initiated: 11/17/2022 Target Resolution Date: 01/09/2023 Goal Status: Active Ulcer/skin breakdown will heal within 14 weeks Date Initiated: 11/17/2022 Target Resolution Date: 01/09/2023 Goal Status: Active Interventions: Assess patient/caregiver ability to obtain necessary supplies Assess patient/caregiver ability to perform ulcer/skin care regimen upon admission and as needed Assess ulceration(s) every visit Treatment Activities: Referred to DME Stuart Guillen for dressing supplies : 11/17/2022 Skin care regimen initiated : 11/17/2022 Topical wound management initiated : 11/17/2022 Notes: Electronic Signature(s) Signed: 12/10/2022 2:53:36 PM By: Redmond Pulling RN, BSN Entered By: Redmond Pulling on 12/10/2022 07:00:33 -------------------------------------------------------------------------------- Pain Assessment Details Patient Name: Date of Service: [name], [name] [name].  12/10/2022 9:30 A M Medical Record Number: [id number] Patient Account Number: [id number] Date of Birth/Sex: Treating RN: November 25, 1970 (52 y.o. Cline Cools Primary Care Karlon Schlafer: [name] Other Clinician: CLIMMIE, [name] ([id number]) [id number] Page 5 of 7 Referring Shawnique Mariotti: Treating Li Fragoso/Extender: [name] in Treatment: 3 Active Problems Location of Pain Severity and Description of Pain Patient Has Paino No Site Locations Pain Management and Medication Current Pain Management: Electronic Signature(s) Signed: 12/10/2022 2:53:36 PM By:  Redmond Pulling RN, BSN Entered By: Redmond Pulling on 12/10/2022 06:51:29 -------------------------------------------------------------------------------- Patient/Caregiver Education Details Patient Name: Date of Service: [name], [name] Aldine Contes 10/9/2024andnbsp9:30 A M Medical Record Number: [id number] Patient Account Number: [id number] Date of Birth/Gender: Treating RN: [birth year] (52 y.o. Cline Cools Primary Care Physician: [name] Other Clinician: Referring Physician: Treating Physician/Extender: [name] in Treatment: 3 Education Assessment Education Provided To: Patient Education Topics Provided Wound/Skin Impairment: Methods: Explain/Verbal Responses: State content correctly Electronic Signature(s) Signed: 12/10/2022 2:53:36 PM By: Redmond Pulling RN, BSN Entered By: Redmond Pulling on 12/10/2022 07:00:48 Cordial, [name] Major ([id number]) [id number] Page 6 of 7 -------------------------------------------------------------------------------- Wound Assessment Details Patient Name: Date of Service: [name], [name] [name]. 12/10/2022 9:30 A M Medical Record Number: [id number] Patient Account Number: [id number] Date of Birth/Sex: Treating RN: 03/22/1970 (52 y.o. Cline Cools Primary Care Vaudine Dutan: [name] Other Clinician: Referring Ivann Trimarco: Treating Eevie Lapp/Extender: [name] in Treatment: 3 Wound Status Wound Number: 7 Primary Etiology: Trauma, Other Wound Location: Left Back Wound Status: Open Wounding Event: Trauma Comorbid History: Anemia, Osteomyelitis, Paraplegia Date Acquired: 09/16/2022 Weeks Of Treatment: 3 Clustered Wound: No Photos Wound Measurements Length: (cm) 2.2 Width: (cm) 0.3 Depth: (cm) 0.1 Area: (cm) 0.518 Volume: (cm) 0.052 % Reduction in Area: 94.3% % Reduction in Volume: 99.4% Epithelialization: Large (67-100%) Tunneling: No Undermining: No Wound  Description Classification: Full Thickness Without Exposed Support Structures Wound Margin: Distinct, outline attached Exudate Amount: Medium Exudate Type: Serosanguineous Exudate Color: red, brown Foul Odor After Cleansing: No Slough/Fibrino Yes Wound Bed Granulation Amount: Large (67-100%) Exposed Structure Granulation Quality: Red Fascia Exposed: No Necrotic Amount: Small (1-33%) Fat Layer (Subcutaneous Tissue) Exposed: Yes Necrotic Quality: Adherent Slough Tendon Exposed: No Muscle Exposed: No Joint Exposed: No Bone Exposed: No Periwound Skin Texture Texture Color No Abnormalities Noted: Yes No Abnormalities Noted: Yes Moisture Temperature / Pain No Abnormalities Noted: Yes Temperature: No Abnormality Treatment Notes Wound #7 (Back) Wound Laterality: Left Cleanser Soap and Water Discharge Instruction: May shower and wash wound with dial antibacterial soap and water prior to dressing change. ZEPHYR, RIDLEY ([id number]) [phone].pdf Page 7 of 7 Wound Cleanser Discharge Instruction: Cleanse the wound with wound cleanser prior to applying a clean dressing using gauze sponges, not tissue or cotton balls. Peri-Wound Care Topical Primary Dressing Maxorb Extra Ag+ Alginate Dressing, 4x4.75 (in/in) Discharge Instruction: Apply to wound bed as instructed Secondary Dressing ABD Pad, 8x10 Discharge Instruction: Apply over primary dressing as directed. Woven Gauze Sponge, Non-Sterile 4x4 in Discharge Instruction: Apply over primary dressing as directed. Secured With 71M Medipore H Soft Cloth Surgical T ape, 4 x 10 (in/yd) Discharge Instruction: Secure with tape as directed. Compression Wrap Compression Stockings Add-Ons Electronic Signature(s) Signed: 12/10/2022 2:53:36 PM By: Redmond Pulling RN, BSN Entered By: Redmond Pulling on 12/10/2022 06:59:25 -------------------------------------------------------------------------------- Vitals  Details Patient Name: Date of Service: [name], [name] [name]. 12/10/2022 9:30 A M Medical Record Number: [id number] Patient Account Number: [id number] Date of Birth/Sex: Treating RN:  1970-08-21 (52 y.o. Cline Cools Primary Care Nicolaas Savo: [name] Other Clinician: Referring [name]: Treating Navjot Loera/Extender: [name] in Treatment: 3 Vital Signs Time Taken: 09:51 Temperature (F): 97.5 Height (in): 78 Pulse (bpm): 62 Weight (lbs): 347 Respiratory Rate (breaths/min): 18 Body Mass Index (BMI): 40.1 Blood Pressure (mmHg): 142/88 Reference Range: 80 - 120 mg / dl Electronic Signature(s) Signed: 12/10/2022 2:53:36 PM By: Redmond Pulling RN, BSN Entered By: Redmond Pulling on 12/10/2022 06:51:21

## 2022-12-10 NOTE — Progress Notes (Signed)
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 patient declined amputation. Infectious disease was also consulted and given the lack of discrete data as well as the prolonged course of oral antibiotics the patient had already taken, no further antibiotic treatment was planned. The patient has been dressing his wounds at home with Santyl and saline moistened gauze. He has an ulcer on his lateral right foot and on his calcaneus. Both have healthy granulation tissue present without any slough or other accumulated debris. 05/06/2022: The right lateral foot ulcer has hypertrophic granulation tissue present with a small amount of slough and some callus buildup around the perimeter. The heel ulcer has just a small opening but some undermining [name] present. There [name] a lot of callus in this area, as well. 05/12/2022: The right lateral foot ulcer has once again accumulated some hypertrophic granulation tissue. It [name] otherwise clean with some periwound callus accumulation. The heel ulcer  also has built up callus around the edges. 05/26/2022: The heel ulcer [name] healed. The right lateral foot ulcer [name] smaller with just some slough accumulation and some callus around the perimeter. 06/17/2022: The right lateral foot ulcer has a layer of hard callus overlying and surrounding it. Once this was removed, the open portion was found to be a couple of millimeters with just a little slough on the surface. 07/01/2022: His wound [name] healed. READMISSION 11/17/2022 He returns today with a new wound on his left lower [name]. He was playing wheelchair basketball in a different  chair than the one he usually uses. Apparently he was bending to pick up the ball and when he sat [name] up, a sharp prominence on the chair tore into his skin. Due to his level of paraplegia, he did not initially feel it. The accident occurred a couple of months ago. Since that time, he has been packing it with a wet-to-dry dressing while he waited to get an appointment in our clinic. 12/10/2022: The wound [name] flush with the surrounding skin and has contracted remarkably. There [name] a little bit of slough and eschar on the surface. No concern for infection. Patient History Information obtained from Patient, Chart. Family History No family history of Cancer, Diabetes, Heart Disease, Hereditary Spherocytosis, Hypertension, Kidney Disease, Lung Disease, Seizures, Stroke, Thyroid Problems, Tuberculosis. Social History [name], [name] ([id number]) [id number] Page 7 of 10 Never smoker, Marital Status - Widowed, Alcohol Use - Rarely, Drug Use - No History, Caffeine Use - Moderate - coffee. Medical History Eyes Denies history of Cataracts, Glaucoma, Optic Neuritis Ear/Nose/Mouth/Throat Denies history of Chronic sinus problems/congestion, Middle ear problems Hematologic/Lymphatic Patient has history of Anemia Endocrine Denies history of Type I Diabetes, Type II Diabetes Genitourinary Denies history of End  Stage Renal Disease Integumentary (Skin) Denies history of History of Burn Musculoskeletal Patient has history of Osteomyelitis Denies history of Gout, Rheumatoid Arthritis, Osteoarthritis Neurologic Patient has history of Paraplegia - T5 spinal cord injury Oncologic Denies history of Received Chemotherapy, Received Radiation Psychiatric Denies history of Anorexia/bulimia, Confinement Anxiety Hospitalization/Surgery History - muscle flap. Medical A Surgical History Notes nd Constitutional Symptoms (General Health) morbid obesity Cardiovascular hyperlipidemia Endocrine hypothyroidism Genitourinary in and out cath, neurogenic bladder, urethral stricture Objective Constitutional Slightly hypertensive. no acute distress. Vitals Time Taken: 9:51 AM, Height: 78 in, Weight: 347 lbs, BMI: 40.1, Temperature: 97.5 F, Pulse: 62 bpm, Respiratory Rate: 18 breaths/min, Blood Pressure: 142/88 mmHg. Respiratory Normal work of breathing on room air. General Notes: 12/10/2022: The wound [name] flush with the surrounding skin and has contracted remarkably. There [name] a little bit of slough and eschar on the surface. No concern for infection. Integumentary (Hair, Skin) Wound #7 status [name] Open. Original cause of wound was Trauma. The date acquired was: 09/16/2022. The wound has been in treatment 3 weeks. The wound [name] located on the Left [name]. The wound measures 2.2cm length x 0.3cm width x 0.1cm depth; 0.518cm^2 area and 0.052cm^3 volume. There [name] Fat Layer (Subcutaneous Tissue) exposed. There [name] no tunneling or undermining noted. There [name] a medium amount of serosanguineous drainage noted. The wound margin [name] distinct with the outline attached to the wound base. There [name] large (67-100%) red granulation within the wound bed. There [name] a small (1-33%) amount of necrotic tissue within the wound bed including Adherent Slough. The periwound skin appearance had no abnormalities noted for texture. The periwound  skin appearance had no abnormalities noted for moisture. The periwound skin appearance had no abnormalities noted for color. Periwound temperature was noted as No Abnormality. Assessment Active Problems ICD-10 Non-pressure chronic ulcer of skin of other sites with fat layer exposed Paraplegia, unspecified Procedures [name], [name] ([id number]) [id number] Page 8 of 10 Wound #7 Pre-procedure diagnosis of Wound #7 [name] a Trauma, Other located on the Left [name] . There was a Selective/Open Wound Non-Viable Tissue Debridement with a total area of 0.52 sq cm performed by [name] Guess, MD. With the following instrument(s): Curette to remove Non-Viable tissue/material. Material removed includes Eschar and Slough and after achieving pain control using Lidocaine 4% Topical  Solution. No specimens were taken. A time out was conducted at 10:00, prior to the start of the procedure. A Minimum amount of bleeding was controlled with Pressure. The procedure was tolerated well. Post Debridement Measurements: 2.2cm length x 0.3cm width x 0.1cm depth; 0.052cm^3 volume. Character of Wound/Ulcer Post Debridement [name] improved. Post procedure Diagnosis Wound #7: Same as Pre-Procedure Plan Follow-up Appointments: Return Appointment in 2 weeks. - Dr. Lady Gary - room 2 Wednesday 12/24/22 @ 1015 Anesthetic: (In clinic) Topical Lidocaine 4% applied to wound bed Bathing/ Shower/ Hygiene: May shower and wash wound with soap and water. The following medication(s) was prescribed: lidocaine topical 4 % cream cream topical once daily was prescribed at facility WOUND #7: - [name] Wound Laterality: Left Cleanser: Soap and Water 1 x Per Day/30 Days Discharge Instructions: May shower and wash wound with dial antibacterial soap and water prior to dressing change. Cleanser: Wound Cleanser 1 x Per Day/30 Days Discharge Instructions: Cleanse the wound with wound cleanser prior to applying a clean dressing  using gauze sponges, not tissue or cotton balls. Prim Dressing: Maxorb Extra Ag+ Alginate Dressing, 4x4.75 (in/in) 1 x Per Day/30 Days ary Discharge Instructions: Apply to wound bed as instructed Secondary Dressing: ABD Pad, 8x10 1 x Per Day/30 Days Discharge Instructions: Apply over primary dressing as directed. Secondary Dressing: Woven Gauze Sponge, Non-Sterile 4x4 in 1 x Per Day/30 Days Discharge Instructions: Apply over primary dressing as directed. Secured With: 74M Medipore H Soft Cloth Surgical T ape, 4 x 10 (in/yd) 1 x Per Day/30 Days Discharge Instructions: Secure with tape as directed. 12/10/2022: The wound [name] flush with the surrounding skin and has contracted remarkably. There [name] a little bit of slough and eschar on the surface. No concern for infection. I used a curette to debride slough and eschar from the wound. We will continue silver alginate. He will follow-up in 2 weeks. I expect him to likely be completely healed at that time. Electronic Signature(s) Signed: 12/10/2022 11:18:16 AM By: [name] Guess MD FACS Entered By: [name] on 12/10/2022 08:18:15 -------------------------------------------------------------------------------- HxROS Details Patient Name: Date of Service: [name], [name]. 12/10/2022 9:30 A M Medical Record Number: [id number] Patient Account Number: [id number] Date of Birth/Sex: Treating RN: 04-Apr-1970 (52 y.o. M) Primary Care Provider: Loura [name] Other Clinician: Referring Provider: Treating Provider/Extender: [name] in Treatment: 3 Information Obtained From Patient Chart Constitutional Symptoms (General Health) Medical History: Past Medical History Notes: morbid obesity Eyes Ericksen, Jamear [name] ([id number]) [phone].pdf Page 9 of 10 Medical History: Negative for: Cataracts; Glaucoma; Optic Neuritis Ear/Nose/Mouth/Throat Medical History: Negative for: Chronic sinus  problems/congestion; Middle ear problems Hematologic/Lymphatic Medical History: Positive for: Anemia Cardiovascular Medical History: Past Medical History Notes: hyperlipidemia Endocrine Medical History: Negative for: Type I Diabetes; Type II Diabetes Past Medical History Notes: hypothyroidism Genitourinary Medical History: Negative for: End Stage Renal Disease Past Medical History Notes: in and out cath, neurogenic bladder, urethral stricture Integumentary (Skin) Medical History: Negative for: History of Burn Musculoskeletal Medical History: Positive for: Osteomyelitis Negative for: Gout; Rheumatoid Arthritis; Osteoarthritis Neurologic Medical History: Positive for: Paraplegia - T5 spinal cord injury Oncologic Medical History: Negative for: Received Chemotherapy; Received Radiation Psychiatric Medical History: Negative for: Anorexia/bulimia; Confinement Anxiety Immunizations Pneumococcal Vaccine: Received Pneumococcal Vaccination: No Implantable Devices None Hospitalization / Surgery History Type of Hospitalization/Surgery muscle flap Family and Social History Cancer: No; Diabetes: No; Heart Disease: No; Hereditary Spherocytosis: No; Hypertension: No; Kidney Disease: No; Lung Disease: No; Seizures: No; Stroke: No; Thyroid Problems:  No; Tuberculosis: No; Never smoker; Marital Status - Widowed; Alcohol Use: Rarely; Drug Use: No History; Caffeine Use: Moderate - coffee; Financial Concerns: No; Food, Clothing or Shelter Needs: No; Support System Lacking: No; Transportation Concerns: No Electronic Signature(s) [name], [name] ([id number]) [id number] Page 10 of 10 Signed: 12/10/2022 12:20:15 PM By: [name] Guess MD FACS Entered By: [name] on 12/10/2022 08:09:46 -------------------------------------------------------------------------------- SuperBill Details Patient Name: Date of Service: [name] [name], [name]. 12/10/2022 Medical  Record Number: [id number] Patient Account Number: [id number] Date of Birth/Sex: Treating RN: [birth year] (52 y.o. M) Primary Care Provider: Loura [name] Other Clinician: Referring Provider: Treating Provider/Extender: [name] in Treatment: 3 Diagnosis Coding ICD-10 Codes Code Description [phone] Non-pressure chronic ulcer of skin of other sites with fat layer exposed G82.20 Paraplegia, unspecified Facility Procedures : CPT4 Code: [id number] Description: 97597 - DEBRIDE WOUND 1ST 20 SQ CM OR < ICD-10 Diagnosis Description L98.492 Non-pressure chronic ulcer of skin of other sites with fat layer exposed Modifier: Quantity: 1 Physician Procedures : CPT4 Code Description Modifier [id number] - WC PHYS LEVEL 3 - EST PT 25 ICD-10 Diagnosis Description L98.492 Non-pressure chronic ulcer of skin of other sites with fat layer exposed G82.20 Paraplegia, unspecified Quantity: 1 : [id number] - WC PHYS DEBR WO ANESTH 20 SQ CM ICD-10 Diagnosis Description L98.492 Non-pressure chronic ulcer of skin of other sites with fat layer exposed Quantity: 1 Electronic Signature(s) Signed: 12/10/2022 11:20:13 AM By: [name] Guess MD FACS Entered By: [name] on 12/10/2022 08:20:13

## 2022-12-24 ENCOUNTER — Ambulatory Visit (HOSPITAL_BASED_OUTPATIENT_CLINIC_OR_DEPARTMENT_OTHER): Payer: 59 | Admitting: General Surgery

## 2023-01-02 ENCOUNTER — Encounter (HOSPITAL_BASED_OUTPATIENT_CLINIC_OR_DEPARTMENT_OTHER): Payer: 59 | Admitting: General Surgery

## 2023-01-05 ENCOUNTER — Encounter (HOSPITAL_BASED_OUTPATIENT_CLINIC_OR_DEPARTMENT_OTHER): Payer: 59 | Attending: General Surgery | Admitting: General Surgery

## 2023-01-05 DIAGNOSIS — I10 Essential (primary) hypertension: Secondary | ICD-10-CM | POA: Diagnosis not present

## 2023-01-05 DIAGNOSIS — L97412 Non-pressure chronic ulcer of right heel and midfoot with fat layer exposed: Secondary | ICD-10-CM | POA: Diagnosis not present

## 2023-01-05 DIAGNOSIS — L98492 Non-pressure chronic ulcer of skin of other sites with fat layer exposed: Secondary | ICD-10-CM | POA: Insufficient documentation

## 2023-01-05 DIAGNOSIS — M86179 Other acute osteomyelitis, unspecified ankle and foot: Secondary | ICD-10-CM | POA: Insufficient documentation

## 2023-01-05 DIAGNOSIS — E039 Hypothyroidism, unspecified: Secondary | ICD-10-CM | POA: Insufficient documentation

## 2023-01-05 DIAGNOSIS — G8220 Paraplegia, unspecified: Secondary | ICD-10-CM | POA: Insufficient documentation

## 2023-01-05 DIAGNOSIS — L97512 Non-pressure chronic ulcer of other part of 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: Secondary | ICD-10-CM | POA: Diagnosis present

## 2023-01-05 NOTE — Progress Notes (Signed)
GANNON, HEINZMAN ([id number]) [id number] Page 1 of 7 Visit Report for 01/05/2023 Arrival Information Details Patient Name: Date of Service: [name], [name] [name]. 01/05/2023 11:15 A M Medical Record Number: [id number] Patient Account Number: [id number] Date of Birth/Sex: Treating RN: 03/29/1970 (52 y.o. Bayard Hugger, Bonita Quin Primary Care Zonya Gudger: [name] Other Clinician: Referring Ninette Cotta: Treating Baili Stang/Extender: [name] in Treatment: 7 Visit Information History Since Last Visit Added or deleted any medications: No Patient Arrived: Wheel Chair Any new allergies or adverse reactions: No Arrival Time: 11:44 Had a fall or experienced change in No Accompanied By: self activities of daily living that may affect Transfer Assistance: None risk of falls: Patient Identification Verified: Yes Signs or symptoms of abuse/neglect since last visito No Secondary Verification Process Completed: Yes Hospitalized since last visit: No Patient Requires Transmission-Based Precautions: No Implantable device outside of the clinic excluding No Patient Has Alerts: No cellular tissue based products placed in the center since last visit: Has Dressing in Place as Prescribed: No Pain Present Now: No Electronic Signature(s) Signed: 01/05/2023 4:36:46 PM By: Zenaida Deed RN, BSN Entered By: Zenaida Deed on 01/05/2023 08:47:02 -------------------------------------------------------------------------------- Clinic Level of Care Assessment Details Patient Name: Date of Service: [name], [name] [name]. 01/05/2023 11:15 A M Medical Record Number: [id number] Patient Account Number: [id number] Date of Birth/Sex: Treating RN: 1970-05-05 (52 y.o. [name] Primary Care Cejay Cambre: [name] Other Clinician: Referring Crystin Lechtenberg: Treating Maliea Grandmaison/Extender: [name] in Treatment: 7 Clinic Level of Care Assessment Items TOOL 4  Quantity Score []  - 0 Use when only an EandM [name] performed on FOLLOW-UP visit ASSESSMENTS - Nursing Assessment / Reassessment X- 1 10 Reassessment of Co-morbidities (includes updates in patient status) X- 1 5 Reassessment of Adherence to Treatment Plan ASSESSMENTS - Wound and Skin A ssessment / Reassessment X - Simple Wound Assessment / Reassessment - one wound 1 5 []  - 0 Complex Wound Assessment / Reassessment - multiple wounds []  - 0 Dermatologic / Skin Assessment (not related to wound area) ASSESSMENTS - Focused Assessment []  - 0 Circumferential Edema Measurements - multi extremities []  - 0 Nutritional Assessment / Counseling / Intervention [name], [name] ([id number]) [id number] Page 2 of 7 []  - 0 Lower Extremity Assessment (monofilament, tuning fork, pulses) []  - 0 Peripheral Arterial Disease Assessment (using hand held doppler) ASSESSMENTS - Ostomy and/or Continence Assessment and Care []  - 0 Incontinence Assessment and Management []  - 0 Ostomy Care Assessment and Management (repouching, etc.) PROCESS - Coordination of Care X - Simple Patient / Family Education for ongoing care 1 15 []  - 0 Complex (extensive) Patient / Family Education for ongoing care X- 1 10 Staff obtains Chiropractor, Records, T Results / Process Orders est []  - 0 Staff telephones HHA, Nursing Homes / Clarify orders / etc []  - 0 Routine Transfer to another Facility (non-emergent condition) []  - 0 Routine Hospital Admission (non-emergent condition) []  - 0 New Admissions / Manufacturing engineer / Ordering NPWT Apligraf, etc. , []  - 0 Emergency Hospital Admission (emergent condition) X- 1 10 Simple Discharge Coordination []  - 0 Complex (extensive) Discharge Coordination PROCESS - Special Needs []  - 0 Pediatric / Minor Patient Management []  - 0 Isolation Patient Management []  - 0 Hearing / Language / Visual special needs []  - 0 Assessment of Community assistance  (transportation, D/C planning, etc.) []  - 0 Additional assistance / Altered mentation []  - 0 Support Surface(s) Assessment (bed, cushion, seat, etc.) INTERVENTIONS - Wound Cleansing /  Measurement []  - 0 Simple Wound Cleansing - one wound []  - 0 Complex Wound Cleansing - multiple wounds X- 1 5 Wound Imaging (photographs - any number of wounds) []  - 0 Wound Tracing (instead of photographs) []  - 0 Simple Wound Measurement - one wound []  - 0 Complex Wound Measurement - multiple wounds INTERVENTIONS - Wound Dressings []  - 0 Small Wound Dressing one or multiple wounds []  - 0 Medium Wound Dressing one or multiple wounds []  - 0 Large Wound Dressing one or multiple wounds []  - 0 Application of Medications - topical []  - 0 Application of Medications - injection INTERVENTIONS - Miscellaneous []  - 0 External ear exam []  - 0 Specimen Collection (cultures, biopsies, blood, body fluids, etc.) []  - 0 Specimen(s) / Culture(s) sent or taken to Lab for analysis []  - 0 Patient Transfer (multiple staff / Nurse, adult / Similar devices) []  - 0 Simple Staple / Suture removal (25 or less) []  - 0 Complex Staple / Suture removal (26 or more) []  - 0 Hypo / Hyperglycemic Management (close monitor of Blood Glucose) [name], [name] ([id number]) [id number] Page 3 of 7 []  - 0 Ankle / Brachial Index (ABI) - do not check if billed separately X- 1 5 Vital Signs Has the patient been seen at the hospital within the last three years: Yes Total Score: 65 Level Of Care: New/Established - Level 2 Electronic Signature(s) Signed: 01/05/2023 4:36:46 PM By: Zenaida Deed RN, BSN Entered By: Zenaida Deed on 01/05/2023 08:56:38 -------------------------------------------------------------------------------- Encounter Discharge Information Details Patient Name: Date of Service: [name], [name] [name]. 01/05/2023 11:15 A M Medical Record Number: [id number] Patient Account Number:  [id number] Date of Birth/Sex: Treating RN: 21-Aug-1970 (52 y.o. [name] Primary Care Tabria Steines: [name] Other Clinician: Referring Rayson Rando: Treating Leshon Armistead/Extender: [name] in Treatment: 7 Encounter Discharge Information Items Discharge Condition: Stable Ambulatory Status: Wheelchair Discharge Destination: Home Transportation: Private Auto Accompanied By: self Schedule Follow-up Appointment: Yes Clinical Summary of Care: Patient Declined Electronic Signature(s) Signed: 01/05/2023 4:36:46 PM By: Zenaida Deed RN, BSN Entered By: Zenaida Deed on 01/05/2023 08:57:13 -------------------------------------------------------------------------------- Lower Extremity Assessment Details Patient Name: Date of Service: [name], [name] [name]. 01/05/2023 11:15 A M Medical Record Number: [id number] Patient Account Number: [id number] Date of Birth/Sex: Treating RN: 1970-05-25 (52 y.o. [name] Primary Care Lynell Greenhouse: [name] Other Clinician: Referring Lolamae Voisin: Treating Shia Eber/Extender: [name] in Treatment: 7 Electronic Signature(s) Signed: 01/05/2023 4:36:46 PM By: Zenaida Deed RN, BSN Entered By: Zenaida Deed on 01/05/2023 08:47:44 -------------------------------------------------------------------------------- Multi Wound Chart Details Patient Name: Date of Service: [name], [name] [name]. 01/05/2023 11:15 A M Medical Record Number: [id number] Patient Account Number: [id number] [name], [name] ([id number]) [id number] Page 4 of 7 Date of Birth/Sex: Treating RN: [birth year] (52 y.o. M) Primary Care Chaniah Cisse: Other Clinician: [name] Referring Tammera Engert: Treating Issiac Jamar/Extender: [name] in Treatment: 7 Vital Signs Height(in): 78 Pulse(bpm): 79 Weight(lbs): 347 Blood Pressure(mmHg): 150/89 Body Mass Index(BMI): 40.1 Temperature(F): 97.6 Respiratory  Rate(breaths/min): 18 [7:Photos:] [N/A:N/A] Left Back N/A N/A Wound Location: Trauma N/A N/A Wounding Event: Trauma, Other N/A N/A Primary Etiology: Anemia, Osteomyelitis, Paraplegia N/A N/A Comorbid History: 09/16/2022 N/A N/A Date Acquired: 7 N/A N/A Weeks of Treatment: Healed - Epithelialized N/A N/A Wound Status: No N/A N/A Wound Recurrence: 0x0x0 N/A N/A Measurements [name] x W x D (cm) 0 N/A N/A A (cm) : rea 0 N/A N/A Volume (cm) : 100.00% N/A N/A %  Reduction in A rea: 100.00% N/A N/A % Reduction in Volume: Full Thickness Without Exposed N/A N/A Classification: Support Structures None Present N/A N/A Exudate Amount: Large (67-100%) N/A N/A Granulation Amount: Red N/A N/A Granulation Quality: Small (1-33%) N/A N/A Necrotic Amount: Fat Layer (Subcutaneous Tissue): Yes N/A N/A Exposed Structures: Fascia: No Tendon: No Muscle: No Joint: No Bone: No Large (67-100%) N/A N/A Epithelialization: No Abnormalities Noted N/A N/A Periwound Skin Texture: No Abnormalities Noted N/A N/A Periwound Skin Moisture: No Abnormalities Noted N/A N/A Periwound Skin Color: No Abnormality N/A N/A Temperature: Treatment Notes Electronic Signature(s) Signed: 01/05/2023 12:03:45 PM By: Duanne Guess MD FACS Entered By: Duanne Guess on 01/05/2023 09:03:45 -------------------------------------------------------------------------------- Multi-Disciplinary Care Plan Details Patient Name: Date of Service: [name], [name] [name]. 01/05/2023 11:15 A M Medical Record Number: [id number] Patient Account Number: [id number] Date of Birth/Sex: Treating RN: Feb 28, 1971 (52 y.o. [name] Primary Care Avneet Ashmore: [name] Other Clinician: Referring Zephaniah Lubrano: Treating Chakira Jachim/Extender: [name] in Treatment: [address], [name] ([id number]) [id number] Page 5 of 7 Active Inactive Electronic Signature(s) Signed: 01/05/2023 4:36:46 PM  By: Zenaida Deed RN, BSN Entered By: Zenaida Deed on 01/05/2023 08:51:39 -------------------------------------------------------------------------------- Pain Assessment Details Patient Name: Date of Service: [name] [name], [name] [name]. 01/05/2023 11:15 A M Medical Record Number: [id number] Patient Account Number: [id number] Date of Birth/Sex: Treating RN: 1970-10-16 (52 y.o. [name] Primary Care Tavita Eastham: [name] Other Clinician: Referring Christoher Drudge: Treating Swetha Rayle/Extender: [name] in Treatment: 7 Active Problems Location of Pain Severity and Description of Pain Patient Has Paino No Site Locations Rate the pain. Current Pain Level: 0 Pain Management and Medication Current Pain Management: Electronic Signature(s) Signed: 01/05/2023 4:36:46 PM By: Zenaida Deed RN, BSN Entered By: Zenaida Deed on 01/05/2023 08:47:37 -------------------------------------------------------------------------------- Patient/Caregiver Education Details Patient Name: Date of Service: [name] [name], [name] Aldine Contes 11/4/2024andnbsp11:15 A M Medical Record Number: [id number] Patient Account Number: [id number] Date of Birth/Gender: Treating RN: [birth year] (52 y.o. [name] Primary Care Physician: [name] Other Clinician: Referring Physician: Treating Physician/Extender: [name] in Treatment: [address] [name] ([id number]) [id number] Page 6 of 7 Education Assessment Education Provided To: Patient Education Topics Provided Wound/Skin Impairment: Methods: Explain/Verbal Responses: Reinforcements needed, State content correctly Electronic Signature(s) Signed: 01/05/2023 4:36:46 PM By: Zenaida Deed RN, BSN Entered By: Zenaida Deed on 01/05/2023 08:51:55 -------------------------------------------------------------------------------- Wound Assessment Details Patient Name: Date of Service: [name] [name], [name]  [name]. 01/05/2023 11:15 A M Medical Record Number: [id number] Patient Account Number: [id number] Date of Birth/Sex: Treating RN: 1970-03-21 (52 y.o. [name] Primary Care Kyann Heydt: [name] Other Clinician: Referring Cristhian Vanhook: Treating Novis League/Extender: [name] in Treatment: 7 Wound Status Wound Number: 7 Primary Etiology: Trauma, Other Wound Location: Left Back Wound Status: Healed - Epithelialized Wounding Event: Trauma Comorbid History: Anemia, Osteomyelitis, Paraplegia Date Acquired: 09/16/2022 Weeks Of Treatment: 7 Clustered Wound: No Photos Wound Measurements Length: (cm) Width: (cm) Depth: (cm) Area: (cm) Volume: (cm) 0 % Reduction in Area: 100% 0 % Reduction in Volume: 100% 0 Epithelialization: Large (67-100%) 0 Tunneling: No 0 Undermining: No Wound Description Classification: Full Thickness Without Exposed Suppor Exudate Amount: None Present t Structures Foul Odor After Cleansing: No Slough/Fibrino No Wound Bed Granulation Amount: Large (67-100%) Exposed Structure Granulation Quality: Red Fascia Exposed: No Necrotic Amount: Small (1-33%) Fat Layer (Subcutaneous Tissue) Exposed: Yes Necrotic Quality: Adherent Slough Tendon Exposed: No Muscle Exposed: No Severns, [name] ([id number]) [id number]  Page 7 of 7 Joint Exposed: No Bone Exposed: No Periwound Skin Texture Texture Color No Abnormalities Noted: Yes No Abnormalities Noted: Yes Moisture Temperature / Pain No Abnormalities Noted: Yes Temperature: No Abnormality Electronic Signature(s) Signed: 01/05/2023 4:36:46 PM By: Zenaida Deed RN, BSN Entered By: Zenaida Deed on 01/05/2023 08:50:48 -------------------------------------------------------------------------------- Vitals Details Patient Name: Date of Service: [name], [name] [name]. 01/05/2023 11:15 A M Medical Record Number: [id number] Patient Account Number: [id number] Date of  Birth/Sex: Treating RN: 26-Jan-1971 (52 y.o. [name] Primary Care Teralyn Mullins: [name] Other Clinician: Referring Amore Grater: Treating Gianpaolo Mindel/Extender: [name] in Treatment: 7 Vital Signs Time Taken: 11:47 Temperature (F): 97.6 Height (in): 78 Pulse (bpm): 79 Weight (lbs): 347 Respiratory Rate (breaths/min): 18 Body Mass Index (BMI): 40.1 Blood Pressure (mmHg): 150/89 Reference Range: 80 - 120 mg / dl Electronic Signature(s) Signed: 01/05/2023 4:36:46 PM By: Zenaida Deed RN, BSN Entered By: Zenaida Deed on 01/05/2023 08:47:27

## 2023-01-05 NOTE — Progress Notes (Signed)
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 history of Osteomyelitis Borski, Herman L ([id number]) [id number] Page 6 of 8 Denies history of Gout, Rheumatoid Arthritis, Osteoarthritis Neurologic Patient has history of Paraplegia - T5 spinal cord injury Oncologic Denies history of Received Chemotherapy, Received Radiation Psychiatric Denies history of Anorexia/bulimia, Confinement Anxiety Hospitalization/Surgery History - muscle flap. Medical A Surgical History Notes nd Constitutional Symptoms (General Health) morbid obesity Cardiovascular hyperlipidemia Endocrine hypothyroidism Genitourinary in and out cath, neurogenic bladder, urethral stricture Objective Constitutional Hypertensive, asymptomatic. no acute distress. Vitals Time Taken: 11:47 AM, Height: 78 in, Weight: 347 lbs, BMI: 40.1, Temperature: 97.6 F, Pulse: 79 bpm, Respiratory Rate: 18 breaths/min, Blood Pressure: 150/89 mmHg. Respiratory Normal work  of breathing on room air.. General Notes: 01/05/2023: His wound is healed. Integumentary (Hair, Skin) Wound #7 status is Healed - Epithelialized. Original cause of wound was Trauma. The date acquired was: 09/16/2022. The wound has been in treatment 7 weeks. The wound is located on the Left Back. The wound measures 0cm length x 0cm width x 0cm depth; 0cm^2 area and 0cm^3 volume. There is Fat Layer (Subcutaneous Tissue) exposed. There is no tunneling or undermining noted. There is a none present amount of drainage noted. There is large (67-100%) red granulation within the wound bed.  There is a small (1-33%) amount of necrotic tissue within the wound bed including Adherent Slough. The periwound skin appearance had no abnormalities noted for texture. The periwound skin appearance had no abnormalities noted for moisture. The periwound skin appearance had no abnormalities noted for color. Periwound temperature was noted as No Abnormality. Assessment Active Problems ICD-10 Non-pressure chronic ulcer of skin of other sites with fat layer exposed Paraplegia, unspecified Plan Discharge From [facility] Services: Discharge from Wound Care Center Bathing/ Shower/ Hygiene: May shower and wash wound with soap and water. 01/05/2023: His wound is healed. He reports that he has gotten rid of the wheelchair that caused the injury. We will discharge him from the wound care center. He may follow-up in the future, should the need arise. Electronic Signature(s) Signed: 01/05/2023 12:10:06 PM By: Duanne Guess MD FACS Entered By: Duanne Guess on 01/05/2023 09:10:06 Logie, [name] Major ([id number]) [id number] Page 7 of 8 -------------------------------------------------------------------------------- HxROS Details Patient Name: Date of Service: [name], [name] 01/05/2023 11:15 A M Medical Record Number: [id number] Patient Account Number: [id number] Date of Birth/Sex: Treating RN: [birth year]  (52 y.o. M) Primary Care Provider: [name] Other Clinician: Referring Provider: Treating Provider/Extender: [name] in Treatment: 7 Information Obtained From Patient Chart Constitutional Symptoms (General Health) Medical History: Past Medical History Notes: morbid obesity Eyes Medical History: Negative for: Cataracts; Glaucoma; Optic Neuritis Ear/Nose/Mouth/Throat Medical History: Negative for: Chronic sinus problems/congestion; Middle ear problems Hematologic/Lymphatic Medical History: Positive for: Anemia Cardiovascular Medical History: Past Medical History Notes: hyperlipidemia Endocrine Medical History: Negative for: Type I Diabetes; Type II Diabetes Past Medical History Notes: hypothyroidism Genitourinary Medical History: Negative for: End Stage Renal Disease Past Medical History Notes: in and out cath, neurogenic bladder, urethral stricture Integumentary (Skin) Medical History: Negative for: History of Burn Musculoskeletal Medical History: Positive for: Osteomyelitis Negative for: Gout; Rheumatoid Arthritis; Osteoarthritis Neurologic Medical History: Positive for: Paraplegia - T5 spinal cord injury Oncologic ATTICUS, WEDIN ([id number]) [id number] Page 8 of 8 Medical History: Negative for: Received Chemotherapy; Received Radiation Psychiatric Medical History: Negative for: Anorexia/bulimia; Confinement Anxiety Immunizations Pneumococcal Vaccine: Received Pneumococcal Vaccination: No Implantable Devices None Hospitalization / Surgery History Type of Hospitalization/Surgery muscle flap Family and Social History Cancer: No; Diabetes: No; Heart Disease: No; Hereditary Spherocytosis: No; Hypertension: No; Kidney Disease: No; Lung Disease: No; Seizures: No; Stroke: No; Thyroid Problems: No; Tuberculosis: No; Never smoker; Marital Status - Widowed; Alcohol Use: Rarely; Drug Use: No History; Caffeine  Use: Moderate - coffee; Financial Concerns: No; Food, Clothing or Shelter Needs: No; Support System Lacking: No; Transportation Concerns: No Electronic Signature(s) Signed: 01/05/2023 12:35:03 PM By: Duanne Guess MD FACS Entered By: Duanne Guess on 01/05/2023 09:04:26 -------------------------------------------------------------------------------- SuperBill Details Patient Name: Date of Service: [name], [name] 01/05/2023 Medical Record Number: [id number] Patient Account Number: [id number] Date of Birth/Sex: Treating RN: 08-11-1970 (52 y.o. [name] Primary Care Provider: [name] Other Clinician: Referring Provider: Treating Provider/Extender: [name] in Treatment: 7 Diagnosis Coding ICD-10 Codes Code Description [id number] Non-pressure chronic ulcer of skin of other sites with fat layer exposed G82.20 Paraplegia, unspecified Facility Procedures : CPT4 Code: [id number] Description: [phone] - WOUND CARE VISIT-LEV 2 EST PT Modifier: Quantity: 1 Physician Procedures : CPT4 Code Description Modifier [id number] - WC PHYS LEVEL 2 - EST PT ICD-10 Diagnosis Description L98.492 Non-pressure chronic ulcer of skin of other sites with fat layer exposed G82.20 Paraplegia, unspecified Quantity: 1 Electronic Signature(s) Signed: 01/05/2023 12:10:23 PM By:  Duanne Guess MD FACS Entered By: Duanne Guess on 01/05/2023 09:10:23

## 2023-01-20 ENCOUNTER — Ambulatory Visit: Payer: 59 | Admitting: Internal Medicine
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
## 2023-05-29 LAB — LAB REPORT - SCANNED
Albumin, Urine POC: 4.7
Calcium: 8.5
EGFR: 109
Free T4: 1 ng/dL
Microalb Creat Ratio: 14
PSA, Total: 0.53
PTH: 79

## 2023-07-01 ENCOUNTER — Encounter (HOSPITAL_BASED_OUTPATIENT_CLINIC_OR_DEPARTMENT_OTHER): Attending: General Surgery | Admitting: General Surgery

## 2023-07-01 ENCOUNTER — Other Ambulatory Visit (HOSPITAL_BASED_OUTPATIENT_CLINIC_OR_DEPARTMENT_OTHER): Payer: Self-pay | Admitting: General Surgery

## 2023-07-01 DIAGNOSIS — L89893 Pressure ulcer of other site, stage 3: Secondary | ICD-10-CM | POA: Insufficient documentation

## 2023-07-01 DIAGNOSIS — L89513 Pressure ulcer of right ankle, stage 3: Secondary | ICD-10-CM | POA: Diagnosis not present

## 2023-07-01 DIAGNOSIS — L89314 Pressure ulcer of right buttock, stage 4: Secondary | ICD-10-CM | POA: Insufficient documentation

## 2023-07-01 DIAGNOSIS — G8220 Paraplegia, unspecified: Secondary | ICD-10-CM | POA: Diagnosis not present

## 2023-07-01 DIAGNOSIS — L89613 Pressure ulcer of right heel, stage 3: Secondary | ICD-10-CM | POA: Insufficient documentation

## 2023-07-03 LAB — SURGICAL PATHOLOGY

## 2023-07-08 ENCOUNTER — Encounter (HOSPITAL_BASED_OUTPATIENT_CLINIC_OR_DEPARTMENT_OTHER): Attending: General Surgery | Admitting: General Surgery

## 2023-07-08 DIAGNOSIS — L89314 Pressure ulcer of right buttock, stage 4: Secondary | ICD-10-CM | POA: Insufficient documentation

## 2023-07-08 DIAGNOSIS — L89513 Pressure ulcer of right ankle, stage 3: Secondary | ICD-10-CM | POA: Insufficient documentation

## 2023-07-08 DIAGNOSIS — L89153 Pressure ulcer of sacral region, stage 3: Secondary | ICD-10-CM | POA: Diagnosis not present

## 2023-07-08 DIAGNOSIS — G8220 Paraplegia, unspecified: Secondary | ICD-10-CM | POA: Insufficient documentation

## 2023-07-08 DIAGNOSIS — L89613 Pressure ulcer of right heel, stage 3: Secondary | ICD-10-CM | POA: Diagnosis present

## 2023-07-08 DIAGNOSIS — L89894 Pressure ulcer of other site, stage 4: Secondary | ICD-10-CM | POA: Insufficient documentation

## 2023-07-13 LAB — AEROBIC CULTURE W GRAM STAIN (SUPERFICIAL SPECIMEN)

## 2023-07-16 ENCOUNTER — Encounter (HOSPITAL_BASED_OUTPATIENT_CLINIC_OR_DEPARTMENT_OTHER): Admitting: Internal Medicine

## 2023-07-16 DIAGNOSIS — L89613 Pressure ulcer of right heel, stage 3: Secondary | ICD-10-CM | POA: Diagnosis not present

## 2023-07-22 ENCOUNTER — Encounter (HOSPITAL_BASED_OUTPATIENT_CLINIC_OR_DEPARTMENT_OTHER): Admitting: General Surgery

## 2023-07-22 DIAGNOSIS — L89613 Pressure ulcer of right heel, stage 3: Secondary | ICD-10-CM | POA: Diagnosis not present

## 2023-07-29 ENCOUNTER — Ambulatory Visit (HOSPITAL_BASED_OUTPATIENT_CLINIC_OR_DEPARTMENT_OTHER): Admitting: General Surgery

## 2023-08-03 ENCOUNTER — Ambulatory Visit: Admitting: Physical Therapy

## 2023-08-04 ENCOUNTER — Encounter: Payer: Self-pay | Admitting: "Endocrinology

## 2023-08-04 ENCOUNTER — Telehealth (INDEPENDENT_AMBULATORY_CARE_PROVIDER_SITE_OTHER): Admitting: "Endocrinology

## 2023-08-04 DIAGNOSIS — E039 Hypothyroidism, unspecified: Secondary | ICD-10-CM | POA: Diagnosis not present
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
## 2023-08-05 ENCOUNTER — Encounter (HOSPITAL_BASED_OUTPATIENT_CLINIC_OR_DEPARTMENT_OTHER): Attending: General Surgery | Admitting: General Surgery

## 2023-08-05 DIAGNOSIS — L89314 Pressure ulcer of right buttock, stage 4: Secondary | ICD-10-CM | POA: Diagnosis not present

## 2023-08-05 DIAGNOSIS — L89513 Pressure ulcer of right ankle, stage 3: Secondary | ICD-10-CM | POA: Diagnosis not present

## 2023-08-05 DIAGNOSIS — G8220 Paraplegia, unspecified: Secondary | ICD-10-CM | POA: Diagnosis not present

## 2023-08-05 DIAGNOSIS — L89894 Pressure ulcer of other site, stage 4: Secondary | ICD-10-CM | POA: Insufficient documentation

## 2023-08-05 DIAGNOSIS — L89613 Pressure ulcer of right heel, stage 3: Secondary | ICD-10-CM | POA: Insufficient documentation

## 2023-08-05 DIAGNOSIS — L89153 Pressure ulcer of sacral region, stage 3: Secondary | ICD-10-CM | POA: Diagnosis not present

## 2023-08-10 ENCOUNTER — Ambulatory Visit: Attending: Registered Nurse

## 2023-08-10 ENCOUNTER — Other Ambulatory Visit: Payer: Self-pay

## 2023-08-10 DIAGNOSIS — R2689 Other abnormalities of gait and mobility: Secondary | ICD-10-CM

## 2023-08-10 DIAGNOSIS — M6281 Muscle weakness (generalized): Secondary | ICD-10-CM
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
## 2023-08-14 NOTE — Addendum Note (Signed)
 Addended by: Kristine Phalen on: 08/14/2023 09:35 AM   Modules accepted: Orders

## 2023-08-20 ENCOUNTER — Encounter (HOSPITAL_BASED_OUTPATIENT_CLINIC_OR_DEPARTMENT_OTHER): Admitting: General Surgery

## 2023-08-25 ENCOUNTER — Encounter (HOSPITAL_BASED_OUTPATIENT_CLINIC_OR_DEPARTMENT_OTHER): Admitting: General Surgery

## 2023-08-25 DIAGNOSIS — L89613 Pressure ulcer of right heel, stage 3: Secondary | ICD-10-CM | POA: Diagnosis not present

## 2023-08-28 ENCOUNTER — Other Ambulatory Visit

## 2023-08-29 LAB — TSH+FREE T4: TSH W/REFLEX TO FT4: 8.38 m[IU]/L — ABNORMAL HIGH (ref 0.40–4.50)

## 2023-08-29 LAB — T4, FREE: Free T4: 1 ng/dL (ref 0.8–1.8)

## 2023-09-08 ENCOUNTER — Other Ambulatory Visit: Payer: Self-pay

## 2023-09-08 ENCOUNTER — Encounter: Payer: Self-pay | Admitting: Internal Medicine

## 2023-09-08 ENCOUNTER — Ambulatory Visit (INDEPENDENT_AMBULATORY_CARE_PROVIDER_SITE_OTHER): Admitting: Internal Medicine

## 2023-09-08 ENCOUNTER — Ambulatory Visit (HOSPITAL_BASED_OUTPATIENT_CLINIC_OR_DEPARTMENT_OTHER): Admitting: General Surgery

## 2023-09-08 VITALS — BP 121/81 | HR 78 | Temp 97.7°F

## 2023-09-08 DIAGNOSIS — M8618 Other acute osteomyelitis, other site: Secondary | ICD-10-CM

## 2023-09-08 DIAGNOSIS — L8944 Pressure ulcer of contiguous site of back, buttock and hip, stage 4: Secondary | ICD-10-CM | POA: Diagnosis not present

## 2023-09-08 DIAGNOSIS — G8220 Paraplegia, unspecified: Secondary | ICD-10-CM | POA: Diagnosis not present

## 2023-09-08 NOTE — Patient Instructions (Signed)
 Please continue to see wound care   We discuss the times when you would need short course (1-2 weeks) of antibiotics == sepsis, abscess. Otherwise even when the bone is infected, there is no good long term sustainable result with long course of antibiotics, but rather increased complication (cdiff infection, resistant organisms)   No need for follow up with id clinic at this time   For your anemia and thyroid  issue, please follow up with your primary care and ask for thyroid  function testing and workup of why you are anemic   If you do have an open wound, many times it would cause a low level of anemia as well
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
## 2023-09-09 ENCOUNTER — Ambulatory Visit (HOSPITAL_BASED_OUTPATIENT_CLINIC_OR_DEPARTMENT_OTHER): Admitting: General Surgery

## 2023-09-10 ENCOUNTER — Encounter (HOSPITAL_BASED_OUTPATIENT_CLINIC_OR_DEPARTMENT_OTHER): Attending: General Surgery | Admitting: General Surgery

## 2023-09-16 ENCOUNTER — Other Ambulatory Visit: Payer: Self-pay | Admitting: "Endocrinology

## 2023-09-16 ENCOUNTER — Ambulatory Visit (INDEPENDENT_AMBULATORY_CARE_PROVIDER_SITE_OTHER): Payer: Self-pay | Admitting: Nurse Practitioner

## 2023-09-16 ENCOUNTER — Encounter: Payer: Self-pay | Admitting: Nurse Practitioner

## 2023-09-16 VITALS — BP 119/82 | HR 80 | Temp 96.8°F

## 2023-09-16 DIAGNOSIS — N319 Neuromuscular dysfunction of bladder, unspecified: Secondary | ICD-10-CM | POA: Diagnosis not present

## 2023-09-16 DIAGNOSIS — E039 Hypothyroidism, unspecified: Secondary | ICD-10-CM

## 2023-09-16 DIAGNOSIS — G8220 Paraplegia, unspecified: Secondary | ICD-10-CM

## 2023-09-16 DIAGNOSIS — D649 Anemia, unspecified: Secondary | ICD-10-CM

## 2023-09-16 DIAGNOSIS — E038 Other specified hypothyroidism: Secondary | ICD-10-CM

## 2023-09-16 DIAGNOSIS — T148XXA Other injury of unspecified body region, initial encounter: Secondary | ICD-10-CM

## 2023-09-16 NOTE — Patient Instructions (Signed)

## 2023-09-16 NOTE — Assessment & Plan Note (Signed)
 Uses a power chair, does self caths at home, able to transfer himself from chair.

## 2023-09-16 NOTE — Assessment & Plan Note (Signed)
 Does self cath himself at home

## 2023-09-16 NOTE — Assessment & Plan Note (Signed)
 On B12 and multiple vitamins We will recheck labs at next visit

## 2023-09-16 NOTE — Assessment & Plan Note (Signed)
 Recent T4 was normal, TSH elevated, has not been on medication for months Followed by endocrinology Component Ref Range & Units (hover) 2 wk ago  TSH W/REFLEX TO FT4 8.38 High   Resulting Agency [facility] [place]

## 2023-09-16 NOTE — Assessment & Plan Note (Signed)
 Continue dressing changes at home and maintain close follow-up with the wound care specialist
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
## 2023-09-29 ENCOUNTER — Telehealth: Payer: Self-pay

## 2023-09-29 NOTE — Telephone Encounter (Signed)
-----   Message from [facility] sent at 09/16/2023  3:43 PM EDT ----- Regarding: RE: Hypothyroidism [name], please let the patient know that his thyroid  came back in the gray zone between normal and abnormal.  He stopped his levothyroxine  last time because of different reasons including tiredness, although thyroid  medication is actually supposed to improve tiredness.  If he wants to try to go back on the medication, I can send in the prescription.  If he is not interested, we can continue to watch it and repeat the labs before next visit.  please let me know.  Thanks ----- Message ----- From: Paseda, Folashade R, FNP Sent: 09/16/2023  10:28 AM EDT To: [name], MD Subject: Hypothyroidism                                 Good morning Dr Birmingham. I saw this patient today. He has not been on levothyroxine  for a long time and he is currently asymptomatic . He had labs done by you recently and he was wondering what recommendations you have for him. Thanks

## 2023-09-29 NOTE — Telephone Encounter (Signed)
 Lvm for pt to call back.

## 2023-10-01 ENCOUNTER — Telehealth: Payer: Self-pay

## 2023-10-01 NOTE — Telephone Encounter (Signed)
 Lvm for pt to call back.

## 2023-10-01 NOTE — Telephone Encounter (Signed)
-----   Message from [facility] sent at 09/16/2023  3:43 PM EDT ----- Regarding: RE: Hypothyroidism [name], please let the patient know that his thyroid  came back in the gray zone between normal and abnormal.  He stopped his levothyroxine  last time because of different reasons including tiredness, although thyroid  medication is actually supposed to improve tiredness.  If he wants to try to go back on the medication, I can send in the prescription.  If he is not interested, we can continue to watch it and repeat the labs before next visit.  please let me know.  Thanks ----- Message ----- From: Paseda, Folashade R, FNP Sent: 09/16/2023  10:28 AM EDT To: [name] Birmingham, MD Subject: Hypothyroidism                                 Good morning Dr [name]. I saw this patient today. He has not been on levothyroxine  for a long time and he is currently asymptomatic . He had labs done by you recently and he was wondering what recommendations you have for him. Thanks

## 2023-10-08 NOTE — Therapy (Incomplete)
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   []  decrease pain  []  blood pressure management   []  control autonomic dysreflexia  []  decrease respiratory distress  []  management of spasticity  []  management of low tone  []  facilitate postural control   []  rest periods   []  control edema  []  increase sitting tolerance   []  aid with transfers     Recline   []  Power recline on power chair  []  Manual recline on manual chair  Comments:    []  intermittent catheterization  []  manage spasticity  []  accommodate femur to back angle  []  change position for pressure relief/cannot weight shift rhigh risk of pressure sore development  []  tilt alone does not accomplish     effective pressure relief, maximum pressure relief achieved at -      _______ degrees tilt   _______ degrees recline   []  difficult to transfer to and from bed []  rest periods and sleeping in chair  []  repositioning for transfers  []  bring to full recline for ADL care  []  clothing/diaper changes in chair  []  gravity PEG tube feeding  []  head positioning  []  decrease pain  []  blood pressure management   []  control autonomic dysreflexia  []  decrease respiratory distress  []  user on ventilator     Elevator on mobility base  []   Power wheelchair  []  Scooter  []  increase Indep in transfers   []  increase Indep in ADLs    []  bathroom function and safety  []  kitchen/cooking function and safety  []  shopping  []  raise height for communication at standing level  []  raise height for eye contact which reduces cervical neck strain and pain  []  drive at raised height for safety and navigating crowds  []  Other:   []  Vertical position system  (anterior tilt)     (Drive locks-out)    []  Stand       (Drive enabled)  []  independent weight bearing  []  decrease joint contractures  []  decrease/manage spasticity  []  decrease/manage spasms  []  pressure distribution away from   scapula, sacrum, coccyx, and ischial tuberosity  []  increase digestion and elimination   []  access to counters and cabinets  []  increase reach  []  increase interaction with others at eye level, reduces neck strain  []  increase performance of       MRADL(s)      Power elevating legrest    []  Center mount (Single) 85-170 degrees       []  Standard (Pair) 100-170 degrees  []  position legs at 90 degrees, not available with std power ELR  []  center mount tucks into chair to decrease turning radius in home, not available with std power ELR  []  provide change in position for LE  []  elevate legs during recline    []  maintain placement of feet on      footplate  []  decrease edema  []  improve circulation  []   actuator needed to elevate legrest  []  actuator needed to articulate legrest preventing knees from flexing  []  Increase ground clearance over      curbs  []   STD (pair) independently                     elevate legrest   POWER WHEELCHAIR CONTROLS      Controls/input device  []  Expandable  []  Non-expandable  []  Proportional  []  Right Hand []  Left Hand  []  Non-proportional/switches/head-array  []  Electrical/proximity         []   Mechanical      Manufacturer:___________________   Type:________________________ []  provides access for controlling  wheelchair  []  programming for accurate control  []  progressive disease/changing condition  []  required for alternative drive      controls       []  lacks motor control to operate  proportional drive control  []  unable to understand proportional controls  []  limited movement/strength  []  extraneous movement / tremors / ataxic / spastic       []  Upgraded electronics controller/harness    []  Single power (tilt or recline)   []  Expandable    []  Non-expandable plus   []  Multi-power (tilt, recline, power legrest, power seat lift, vertical positioning system, stand)  []  allows input device to communicate with drive motors  []  harness provides necessary connections between the controller, input device, and seat functions     []  needed in order to operate power seat functions through joystick/ input device  []  required for alternative drive controls     []  Enhanced display  []  required to connect all alternative drive controls   []  required for upgraded joystick      (lite-throw, heavy duty, micro)  []  Allows user to see in which mode and drive the wheelchair is set; necessary for alternate controls       []  Upgraded tracking electronics  []  correct tracking when on uneven surfaces makes switch driving more efficient and less fatiguing  []  increase safety when driving  []  increase ability to traverse thresholds    []  Safety / reset / mode switches     Type:    []  Used to change modes and stop the wheelchair when driving     []  Mount for joystick / input device/switches  []  swing away for access or transfers   []  attaches joystick / input device / switches to wheelchair   []  provides for consistent access  []  midline for optimal placement    []  Attendant controlled joystick plus     mount  []  safety  []  long distance driving  []  operation of seat functions  []  compliance with transportation regulations    []  Battery  []  required to power (power assist / scooter/ power wc / other):   []   Power inverter (24V to 12V)  []  required for ventilator / respiratory equipment / other:     CHAIR OPTIONS MANUAL & POWER      Armrests   []  adjustable height []  removable  []  swing away []  fixed  []  flip back  []  reclining  []  full length pads []  desk []  tube arms []  gel pads  []  provide support with elbow at 90    []  remove/flip back/swing away for  transfers  []  provide support and positioning of upper body    []  allow to come closer to table top  []  remove for access to tables  []  provide support for  w/c tray  []  change of height/angles for variable activities   []  Elbow support / Elbow stop  []  keep elbow positioned on arm pad  []  keep arms from falling off arm pad  during tilt and/or recline   Upper Extremity Support  []  Arm trough  []   R  []   L  Style:  []  swivel mount []  fixed mount   []  posterior hand support  []   tray  []  full tray  []  joystick cut out  []   R  []   L  Style:  []  decrease gravitational pull on      shoulders  []  provide support to increase UE  function  []  provide hand support in natural    position  []  position flaccid UE  []  decrease subluxation    []  decrease edema       []  manage spasticity   []  provide midline positioning  []  provide work surface  []  placement for AAC/ Computer/ EADL       Hangers/ Legrests   []  ______ degree  []  Elevating []  articulating  []  swing away []  fixed []  lift off  []  heavy duty  []  adjustable knee angle  []  adjustable calf panel   []  longer extension tube              []  provide LE support  []  maintain placement of feet on      footplate   []  accommodate lower leg length  []  accommodate to hamstring       tightness  []  enable transfers  []  provide change in position for LE's  []  elevate legs during recline    []  decrease edema  []  durability      Foot support   []  footplate []  R []  L []  flip up           []  Depth adjustable   []  angle adjustable  []  foot board/one piece    []  provide foot support  []   accommodate to ankle ROM  []  allow foot to go under wheelchair base  []  enable transfers     []  Shoe holders  []  position foot    []  decrease / manage spasticity  []  control position of LE  []  stability    []  safety     []  Ankle strap/heel      loops  []  support foot on foot support  []  decrease extraneous movement  []  provide input to heel   []  protect foot     []  Amputee adapter []  R  []  L     Style:                  Size:  []  Provide support for stump/residual extremity    []  Transportation tie-down  []  to provide crash tested tie-down brackets    []  Crutch/cane holder    []  O2 holder    []  IV hanger   []  Ventilator tray/mount    []  stabilize accessory on wheelchair       Component  Justification     []  Seat cushion      []  accommodate impaired sensation  []  decubitus ulcers present or history  []  unable to shift weight  []  increase pressure distribution  []  prevent pelvic extension  []  custom required "off-the-shelf"    seat cushion will not accommodate deformity  []  stabilize/promote pelvis alignment  []  stabilize/promote femur alignment  []  accommodate obliquity  []  accommodate multiple deformity  []  incontinent/accidents  []   low maintenance     []  seat mounts                 []  fixed []  removable  []  attach seat platform/cushion to wheelchair frame    []  Seat wedge    []  provide increased aggressiveness of seat shape to decrease sliding  down in the seat  []  accommodate ROM        []  Cover replacement   []  protect back or seat cushion  []  incontinent/accidents    []  Solid seat / insert    []  support cushion to prevent      hammocking  []  allows attachment of cushion to mobility base    []  Lateral pelvic/thigh/hip     support (Guides)     []  decrease abduction  []  accommodate pelvis  []  position upper legs  []  accommodate spasticity  []  removable for transfers     []  Lateral pelvic/thigh      supports mounts  []  fixed   []  swing-away   []  removable  []  mounts  lateral pelvic/thigh supports     []  mounts lateral pelvic/thigh supports swing-away or removable for transfers    []  Medial thigh support ([place]mel)  [] decrease adduction  [] accommodate ROM  []  remove for transfers   []  alignment      []  Medial thigh   []  fixed      support mounts      []  swing-away   []  removable  []  mounts medial thigh supports   []  Mounts medial supports swing- away or removable for transfers       Component  Justification   []  Back       []  provide posterior trunk support []  facilitate tone  []  provide lumbar/sacral support []  accommodate deformity  []  support trunk in midline   []  custom required "off-the-shelf" back support will not accommodate deformity   []  provide lateral trunk support []  accommodate or decrease tone            []  Back mounts  []  fixed  []  removable  []  attach back rest/cushion to wheelchair frame   []  Lateral trunk      supports  []  R []  L  []  decrease lateral trunk leaning  []  accommodate asymmetry    []  contour for increased contact  []  safety    []  control of tone    []  Lateral trunk      supports mounts  []  fixed  []  swing-away   []  removable  []  mounts lateral trunk supports     []  Mounts lateral trunk supports swing-away or removable for transfers   []  Anterior chest      strap, vest     []  decrease forward movement of shoulder  []  decrease forward movement of trunk  []  safety/stability  []  added abdominal support  []  trunk alignment  []  assistance with shoulder control   []  decrease shoulder elevation    []  Headrest      []  provide posterior head support  []  provide posterior neck support  []  provide lateral head support  []  provide anterior head support  []  support during tilt and recline  []  improve feeding     []  improve respiration  []  placement of switches  []  safety    []  accommodate ROM   []  accommodate tone  []  improve visual orientation   []  Headrest           []  fixed []  removable []  flip down  Mounting  hardware   []  swing-away laterals/switches  []  mount headrest   []  mounts headrest flip down or  removable for transfers  []  mount headrest swing-away laterals   []  mount switches     []  Neck Support    []  decrease neck rotation  []  decrease forward neck flexion   Pelvic Positioner    []  std hip belt          []  padded hip belt  []  dual pull hip belt  []  four point hip belt  []  stabilize tone  []  decrease falling out of chair  []  prevent excessive extension  []  special pull angle to control      rotation  []  pad for protection over boney   prominence  []  promote comfort    []  Essential needs        bag/pouch   []  medicines []  special food rorthotics []  clothing changes  []  diapers  []  catheter/hygiene []  ostomy supplies   The above equipment has a life- long use expectancy.  Growth and changes in medical and/or functional conditions would be the exceptions.   SUMMARY:  Why mobility device was selected; include why a lower level device is not appropriate: ***  ASSESSMENT:  CLINICAL IMPRESSION: Patient is a *** y.o. *** who was seen today for physical therapy evaluation and treatment for ***.    OBJECTIVE IMPAIRMENTS {opptimpairments:25111}.   ACTIVITY LIMITATIONS {activitylimitations:27494}  PARTICIPATION LIMITATIONS: {participationrestrictions:25113}  PERSONAL FACTORS {Personal factors:25162} are also affecting patient's functional outcome.   REHAB POTENTIAL: {rehabpotential:25112}  CLINICAL DECISION MAKING: {clinical decision making:25114}  EVALUATION COMPLEXITY: {Evaluation complexity:25115}                                   GOALS: One time visit. No goals established.    PLAN: PT FREQUENCY: one time visit    Raj LOISE Blanch, PT 10/08/2023, 3:22 PM    I concur with the above findings and recommendations of the therapist:  Physician name printed:         Physician's signature:      Date:

## 2023-10-09 ENCOUNTER — Other Ambulatory Visit: Payer: Self-pay

## 2023-10-09 ENCOUNTER — Ambulatory Visit: Attending: General Surgery

## 2023-10-09 DIAGNOSIS — M6281 Muscle weakness (generalized): Secondary | ICD-10-CM | POA: Diagnosis present

## 2023-10-09 DIAGNOSIS — R2689 Other abnormalities of gait and mobility: Secondary | ICD-10-CM | POA: Insufficient documentation
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
## 2023-10-19 ENCOUNTER — Telehealth: Payer: Self-pay

## 2023-10-19 NOTE — Telephone Encounter (Signed)
 Copied from CRM #[id number]. Topic: General - Other >> Oct 19, 2023  2:15 PM Debby BROCKS wrote: Reason for CRM: Patient states that he is in a wheelchair and would like to know if the clinic can provide wound care nurses to help him clean the wound after his appointment next month

## 2023-10-28 ENCOUNTER — Telehealth: Payer: Self-pay

## 2023-10-28 NOTE — Telephone Encounter (Signed)
 Copied from CRM [id number]. Topic: Clinical - Medical Advice >> Oct 27, 2023  3:54 PM Amy B wrote: Reason for CRM: Patient asks if he can get a nurse to come to his home for wound care after his surgery.  He also would like to know the status of his power wheelchair.  He requests a call back to discuss.  Pt surgeon was called to ask if he would be able to help pt with in or out pt home health post surgery. No answer lvm. KH

## 2023-10-29 NOTE — Telephone Encounter (Signed)
 Done [facility]

## 2023-11-17 ENCOUNTER — Other Ambulatory Visit

## 2023-11-18 LAB — TSH+FREE T4: TSH W/REFLEX TO FT4: 9.57 m[IU]/L — ABNORMAL HIGH (ref 0.40–4.50)

## 2023-11-18 LAB — T4, FREE: Free T4: 1 ng/dL (ref 0.8–1.8)

## 2023-11-24 ENCOUNTER — Ambulatory Visit (INDEPENDENT_AMBULATORY_CARE_PROVIDER_SITE_OTHER): Admitting: "Endocrinology

## 2023-11-24 ENCOUNTER — Encounter: Payer: Self-pay | Admitting: "Endocrinology

## 2023-11-24 VITALS — BP 110/70 | HR 92 | Ht 78.0 in | Wt 270.0 lb

## 2023-11-24 DIAGNOSIS — R1310 Dysphagia, unspecified: Secondary | ICD-10-CM

## 2023-11-24 DIAGNOSIS — E038 Other specified hypothyroidism: Secondary | ICD-10-CM

## 2023-11-24 MED ORDER — LEVOTHYROXINE SODIUM 25 MCG PO TABS: 25.0000 ug | ORAL_TABLET | Freq: Every day | ORAL | 1 refills | Status: DC
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
## 2023-12-07 ENCOUNTER — Ambulatory Visit (HOSPITAL_COMMUNITY)
Admission: RE | Admit: 2023-12-07 | Discharge: 2023-12-07 | Disposition: A | Discharge status: Home / Self Care | Source: Ambulatory Visit | Attending: "Endocrinology | Admitting: "Endocrinology

## 2023-12-07 DIAGNOSIS — R1310 Dysphagia, unspecified: Secondary | ICD-10-CM | POA: Insufficient documentation

## 2023-12-18 ENCOUNTER — Ambulatory Visit: Payer: Self-pay | Admitting: Nurse Practitioner

## 2023-12-18 ENCOUNTER — Encounter: Payer: Self-pay | Admitting: Nurse Practitioner

## 2023-12-18 VITALS — BP 105/67 | HR 113

## 2023-12-18 DIAGNOSIS — Z13220 Encounter for screening for lipoid disorders: Secondary | ICD-10-CM | POA: Insufficient documentation

## 2023-12-18 DIAGNOSIS — L89524 Pressure ulcer of left ankle, stage 4: Secondary | ICD-10-CM | POA: Insufficient documentation

## 2023-12-18 DIAGNOSIS — G8220 Paraplegia, unspecified: Secondary | ICD-10-CM

## 2023-12-18 DIAGNOSIS — S91301A Unspecified open wound, right foot, initial encounter: Secondary | ICD-10-CM | POA: Insufficient documentation

## 2023-12-18 DIAGNOSIS — L89314 Pressure ulcer of right buttock, stage 4: Secondary | ICD-10-CM | POA: Insufficient documentation

## 2023-12-18 DIAGNOSIS — E559 Vitamin D deficiency, unspecified: Secondary | ICD-10-CM | POA: Diagnosis not present

## 2023-12-18 DIAGNOSIS — D649 Anemia, unspecified: Secondary | ICD-10-CM

## 2023-12-18 DIAGNOSIS — Z7409 Other reduced mobility: Secondary | ICD-10-CM

## 2023-12-18 DIAGNOSIS — Z789 Other specified health status: Secondary | ICD-10-CM

## 2023-12-18 DIAGNOSIS — N319 Neuromuscular dysfunction of bladder, unspecified: Secondary | ICD-10-CM

## 2023-12-18 DIAGNOSIS — E039 Hypothyroidism, unspecified: Secondary | ICD-10-CM

## 2023-12-18 DIAGNOSIS — L89324 Pressure ulcer of left buttock, stage 4: Secondary | ICD-10-CM | POA: Insufficient documentation
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
## 2023-12-18 NOTE — Assessment & Plan Note (Signed)
 Continue levothyroxine  25 mcg daily Followed by endocrinology

## 2023-12-18 NOTE — Assessment & Plan Note (Addendum)
-   Refer to podiatrist  for evaluation of right foot burn. - Instruct to check feet daily for new wounds or changes.

## 2023-12-18 NOTE — Assessment & Plan Note (Addendum)
 Has upcoming excision right ischial pressure ulcer with ostectomy

## 2023-12-18 NOTE — Assessment & Plan Note (Signed)
 Using a manual wheelchair insurance declined power chair

## 2023-12-18 NOTE — Assessment & Plan Note (Signed)
Checking CBC and iron panel.

## 2023-12-18 NOTE — Patient Instructions (Signed)
 1. Vitamin D deficiency (Primary)  - VITAMIN D 25 Hydroxy (Vit-D Deficiency, Fractures)  2. Anemia, unspecified type  - CBC - Iron, TIBC and Ferritin Panel - Vitamin B12  3. Screening for lipid disorders  - Lipid panel  4. Open wound of right heel, initial encounter  - Ambulatory referral to Podiatry   It is important that you exercise regularly at least 30 minutes 5 times a week as tolerated  Think about what you will eat, plan ahead. Choose  clean, green, fresh or frozen over canned, processed or packaged foods which are more sugary, salty and fatty. 70 to 75% of food eaten should be vegetables and fruit. Three meals at set times with snacks allowed between meals, but they must be fruit or vegetables. Aim to eat over a 12 hour period , example 7 am to 7 pm, and STOP after  your last meal of the day. Drink water,generally about 64 ounces per day, no other drink is as healthy. Fruit juice is best enjoyed in a healthy way, by EATING the fruit.  Thanks for choosing [facility] we consider it a privelige to serve you.

## 2023-12-19 LAB — CBC
Hematocrit: 30.3 % — ABNORMAL LOW (ref 37.5–51.0)
Hemoglobin: 8.7 g/dL — ABNORMAL LOW (ref 13.0–17.7)
MCH: 22.7 pg — ABNORMAL LOW (ref 26.6–33.0)
MCHC: 28.7 g/dL — ABNORMAL LOW (ref 31.5–35.7)
MCV: 79 fL (ref 79–97)
Platelets: 493 x10E3/uL — ABNORMAL HIGH (ref 150–450)
RBC: 3.83 x10E6/uL — ABNORMAL LOW (ref 4.14–5.80)
RDW: 16 % — ABNORMAL HIGH (ref 11.6–15.4)
WBC: 11.2 x10E3/uL — ABNORMAL HIGH (ref 3.4–10.8)

## 2023-12-19 LAB — VITAMIN D 25 HYDROXY (VIT D DEFICIENCY, FRACTURES): Vit D, 25-Hydroxy: 49.4 ng/mL (ref 30.0–100.0)

## 2023-12-19 LAB — IRON,TIBC AND FERRITIN PANEL
Ferritin: 497 ng/mL — ABNORMAL HIGH (ref 30–400)
Iron Saturation: 7 % — CL (ref 15–55)
Iron: 13 ug/dL — ABNORMAL LOW (ref 38–169)
Total Iron Binding Capacity: 193 ug/dL — ABNORMAL LOW (ref 250–450)
UIBC: 180 ug/dL (ref 111–343)

## 2023-12-19 LAB — LIPID PANEL
Chol/HDL Ratio: 3.8 ratio (ref 0.0–5.0)
Cholesterol, Total: 133 mg/dL (ref 100–199)
HDL: 35 mg/dL — ABNORMAL LOW (ref >39)
LDL Chol Calc (NIH): 84 mg/dL (ref 0–99)
Triglycerides: 67 mg/dL (ref 0–149)
VLDL Cholesterol Cal: 14 mg/dL (ref 5–40)

## 2023-12-19 LAB — VITAMIN B12: Vitamin B-12: 802 pg/mL (ref 232–1245)

## 2023-12-21 ENCOUNTER — Ambulatory Visit: Payer: Self-pay | Admitting: Nurse Practitioner

## 2023-12-21 DIAGNOSIS — D509 Iron deficiency anemia, unspecified: Secondary | ICD-10-CM

## 2023-12-21 MED ORDER — FERROUS SULFATE 325 (65 FE) MG PO TBEC: 325.0000 mg | DELAYED_RELEASE_TABLET | Freq: Every day | ORAL | 1 refills | Status: AC

## 2023-12-21 NOTE — Telephone Encounter (Signed)
-----   Message from Folashade R Paseda sent at 12/21/2023  9:19 AM EDT ----- Iron deficiency anemia.  Please start taking ferrous sulfate 325 mg daily.  Take with vitamin C to improve absorption  Your bad cholesterol level is normal ,good cholesterol level is low.  I encourage a heart healthy diet rich in fruits and vegetables and healthy fats limit saturated fats and trans fats, engage in  regular moderate exercises at least 30 minutes weekly as tolerated  Please follow-up in 6 weeks for iron deficiency anemia ----- Message ----- From: Interface, [facility] Lab Results In Sent: 12/19/2023   7:38 AM EDT To: Folashade R Paseda, FNP

## 2023-12-21 NOTE — Telephone Encounter (Signed)
 Patient aware of results and recommendations.

## 2023-12-22 ENCOUNTER — Ambulatory Visit (INDEPENDENT_AMBULATORY_CARE_PROVIDER_SITE_OTHER): Payer: Self-pay

## 2023-12-22 VITALS — Ht 78.0 in | Wt 270.0 lb

## 2023-12-22 DIAGNOSIS — Z0000 Encounter for general adult medical examination without abnormal findings: Secondary | ICD-10-CM
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
## 2023-12-22 NOTE — Patient Instructions (Signed)
 [name],  Thank you for taking the time for your Medicare Wellness Visit. I appreciate your continued commitment to your health goals. Please review the care plan we discussed, and feel free to reach out if I can assist you further.  Medicare recommends these wellness visits once per year to help you and your care team stay ahead of potential health issues. These visits are designed to focus on prevention, allowing your provider to concentrate on managing your acute and chronic conditions during your regular appointments.  Please note that Annual Wellness Visits do not include a physical exam. Some assessments may be limited, especially if the visit was conducted virtually. If needed, we may recommend a separate in-person follow-up with your provider.  Ongoing Care Seeing your primary care provider every 3 to 6 months helps us  monitor your health and provide consistent, personalized care.   Referrals If a referral was made during today's visit and you haven't received any updates within two weeks, please contact the referred provider directly to check on the status.  Recommended Screenings:  Health Maintenance  Topic Date Due   Hepatitis C Screening  Never done   DTaP/Tdap/Td vaccine (1 - Tdap) Never done   Hepatitis B Vaccine (1 of 3 - 19+ 3-dose series) Never done   Pneumococcal Vaccine for age over 33 (2 of 2 - PCV) 03/04/2019   Zoster (Shingles) Vaccine (1 of 2) Never done   COVID-19 Vaccine (4 - 2025-26 season) 11/02/2023   Flu Shot  05/31/2024*   Medicare Annual Wellness Visit  12/21/2024   Colon Cancer Screening  07/11/2029   HIV Screening  Completed   HPV Vaccine  Aged Out   Meningitis B Vaccine  Aged Out  *Topic was postponed. The date shown is not the original due date.       12/22/2023    9:15 AM  Advanced Directives  Does Patient Have a Medical Advance Directive? No  Would patient like information on creating a medical advance directive? Yes  (MAU/Ambulatory/Procedural Areas - Information given)   Advance Care Planning is important because it: Ensures you receive medical care that aligns with your values, goals, and preferences. Provides guidance to your family and loved ones, reducing the emotional burden of decision-making during critical moments.  Information on Advanced Care Planning can be found at [place]  Secretary of [facility] Advance Health Care Directives Advance Health Care Directives ([email or web address]   Vision: Annual vision screenings are recommended for early detection of glaucoma, cataracts, and diabetic retinopathy. These exams can also reveal signs of chronic conditions such as diabetes and high blood pressure.  Dental: Annual dental screenings help detect early signs of oral cancer, gum disease, and other conditions linked to overall health, including heart disease and diabetes.  Please see the attached documents for additional preventive care recommendations.

## 2023-12-23 DIAGNOSIS — Z789 Other specified health status: Secondary | ICD-10-CM | POA: Insufficient documentation

## 2023-12-23 NOTE — Assessment & Plan Note (Addendum)
 Currently using a manual wheelchair , he requested for a power chair but insurance  Will resubmit the request Power chair needed to assist with mobility and ADLS  and better pressure relief and positioning to aid sacral wound healing

## 2024-02-11 ENCOUNTER — Other Ambulatory Visit

## 2024-02-16 ENCOUNTER — Ambulatory Visit: Admitting: "Endocrinology

## 2024-02-16 NOTE — Telephone Encounter (Signed)
 Weekly safety lab results received via fax, entered and uploaded to EMR for review.

## 2024-02-16 NOTE — Telephone Encounter (Signed)
 Received call from [place] at [facility], stating patient is leaving AMA today. His EOT is 12/16, finished ABT and they are removing PICC line and she wanted to make the provider aware.   Sent to provider FYI

## 2024-02-16 NOTE — Telephone Encounter (Signed)
 Unable to locate which provider was overseeing this patient. No f/u scheduled either.

## 2024-02-16 NOTE — Telephone Encounter (Signed)
 Call placed to [facility], spoke with Blair, patient said he had things he had to do today and that he was leaving before his last dose of Daptomycin. The facility was able to pull the patient PICC line but ultimately he left AMA.  Message to provider for awareness.

## 2024-02-17 ENCOUNTER — Ambulatory Visit: Payer: Self-pay

## 2024-02-17 NOTE — Telephone Encounter (Signed)
 Per chart, message received from [name] [facility], PICC pulled but did not receive last dose of daptomycin before AMA. Attempted to contact pt to f/u but NA. LM for him to return call. Attempt #1   Copied from CRM #[id number]. Topic: Clinical - Medical Advice >> Feb 17, 2024  4:31 PM Nessti S wrote: Reason for CRM: called because pt left against medical advice. Refuse to take med and left without med.

## 2024-02-17 NOTE — Telephone Encounter (Signed)
° ° °  Lab results on 02/12/24 reviewed (recvd 02/15/24):    CMP: Electrolytes wnl Scr 0.68, eGFR > 90 LFTs wnl  CBC: WBC 6.20 H/H improving at 9.7/30.6 Plts 297 Eosinophils 0.2  CRP error in type obtained ESR  116 CK 52   No changes needed at this time.

## 2024-02-18 NOTE — Telephone Encounter (Signed)
 FYI. Pt refused service and walked out  without medication. KH

## 2024-02-23 ENCOUNTER — Telehealth: Payer: Self-pay | Admitting: Nurse Practitioner

## 2024-02-23 NOTE — Telephone Encounter (Signed)
 Copied from CRM #[id number]. Topic: Clinical - Order For Equipment >> Feb 22, 2024  3:17 PM Montie POUR wrote: Reason for CRM:  The form that was faxed back to [employer] for [facility] wheelchair is incorrect.  1. On line 2, page 3, the PMD standard written order. It needs to state PMD or power wheelchair. Please sign and date correction and fax back to [phone].  Please call Montie at [phone] with questions. Thanks

## 2024-03-09 ENCOUNTER — Telehealth: Payer: Self-pay

## 2024-03-09 NOTE — Telephone Encounter (Signed)
 Copied from CRM [id number]. Topic: General - Other >> Mar 04, 2024 11:39 AM Victoria B wrote: Reason for CRM: Marcos from [employer] called in states, ine 2 is incorrect for form they sent on Dec 16.needs to be redone. Fx is [phone]  Done and notes attached. KH

## 2024-03-15 ENCOUNTER — Telehealth: Payer: Self-pay | Admitting: Nurse Practitioner

## 2024-03-15 NOTE — Telephone Encounter (Signed)
 Placed document in providers folder. 03/15/24

## 2024-03-16 ENCOUNTER — Telehealth: Payer: Self-pay

## 2024-03-16 NOTE — Telephone Encounter (Signed)
 Copied from CRM [id number]. Topic: General - Other >> Mar 16, 2024  1:50 PM Myrick T wrote: Reason for CRM: Brigitte from [facility] [employer] stated the form faxed on 1/12 needs to be completed and sent back as soon as possible  Forms have been sent to [employer] . KH

## 2024-03-17 ENCOUNTER — Telehealth: Payer: Self-pay

## 2024-03-17 NOTE — Telephone Encounter (Signed)
 Copied from CRM [id number]. Topic: General - Other >> Mar 17, 2024 11:02 AM Deaijah H wrote: Reason for CRM: Angie w/ [employer] called in received paperwork that they're urgently in need of , have not received chart notes or clinical pertaining to last visit. Patient is urgently in need of Cather supplies. Please call [phone]  [facility] was called back to advise that all forms have been returned and I also d=faxed over the confirmation forms as well. KH

## 2024-03-21 ENCOUNTER — Ambulatory Visit: Payer: Self-pay | Admitting: Nurse Practitioner

## 2024-03-25 NOTE — Assessment & Plan Note (Addendum)
 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

## 2024-03-28 ENCOUNTER — Ambulatory Visit: Admitting: Physical Therapy

## 2024-03-28 ENCOUNTER — Telehealth: Payer: Self-pay

## 2024-03-28 MED ORDER — LEVOTHYROXINE SODIUM 25 MCG PO TABS: 25.0000 ug | ORAL_TABLET | Freq: Every day | ORAL | 0 refills | Status: AC

## 2024-03-28 NOTE — Telephone Encounter (Signed)
 Copied from CRM [id number]. Topic: Clinical - Medication Refill >> Mar 28, 2024 11:51 AM Deaijah H wrote: Medication: levothyroxine  (SYNTHROID ) 25 MCG tablet  Has the patient contacted their pharmacy? Yes (Agent: If no, request that the patient contact the pharmacy for the refill. If patient does not wish to contact the pharmacy document the reason why and proceed with request.) Dr. Irl to sign  (Agent: If yes, when and what did the pharmacy advise?)  This is the patient's preferred pharmacy:  [facility] [phone] [address] [place] [place] - [address] [place] [address] Phone: [phone] Fax: [phone]  Is this the correct pharmacy for this prescription? Yes If no, delete pharmacy and type the correct one.   Has the prescription been filled recently? Yes  Is the patient out of the medication? No, 1 tablet left   Has the patient been seen for an appointment in the last year OR does the patient have an upcoming appointment? Yes  Can we respond through MyChart? Yes  Agent: Please be advised that Rx refills may take up to 3 business days. We ask that you follow-up with your pharmacy.  30 day supply has been sent. CB.

## 2024-03-29 ENCOUNTER — Telehealth: Payer: Self-pay

## 2024-03-29 NOTE — Telephone Encounter (Signed)
 Copied from CRM [id number]. Topic: General - Other >> Mar 17, 2024 11:02 AM Deaijah H wrote: Reason for CRM: Angie w/ [employer] called in received paperwork that they're urgently in need of , have not received chart notes or clinical pertaining to last visit. Patient is urgently in need of Cather supplies. Please call [phone]  Last OV has been faxed as requested. CB.

## 2024-04-04 ENCOUNTER — Ambulatory Visit: Admitting: Nurse Practitioner

## 2024-04-25 ENCOUNTER — Ambulatory Visit

## 2024-04-27 ENCOUNTER — Ambulatory Visit: Admitting: Nurse Practitioner
